# Patient Record
Sex: Male | Born: 1945 | Race: White | Hispanic: No | Marital: Married | Smoking: Never smoker
Health system: Southern US, Community
[De-identification: ages and names within clinical notes are randomized; demographics above are authoritative.]

## PROBLEM LIST (undated history)

## (undated) DIAGNOSIS — G4730 Sleep apnea, unspecified: Secondary | ICD-10-CM

## (undated) DIAGNOSIS — F32A Depression, unspecified: Secondary | ICD-10-CM

## (undated) DIAGNOSIS — E119 Type 2 diabetes mellitus without complications: Secondary | ICD-10-CM

## (undated) DIAGNOSIS — I255 Ischemic cardiomyopathy: Secondary | ICD-10-CM

## (undated) DIAGNOSIS — M25569 Pain in unspecified knee: Secondary | ICD-10-CM

## (undated) DIAGNOSIS — I4729 Other ventricular tachycardia: Secondary | ICD-10-CM

## (undated) DIAGNOSIS — R600 Localized edema: Secondary | ICD-10-CM

## (undated) DIAGNOSIS — F419 Anxiety disorder, unspecified: Secondary | ICD-10-CM

## (undated) DIAGNOSIS — L03119 Cellulitis of unspecified part of limb: Secondary | ICD-10-CM

## (undated) DIAGNOSIS — F329 Major depressive disorder, single episode, unspecified: Secondary | ICD-10-CM

## (undated) DIAGNOSIS — I2510 Atherosclerotic heart disease of native coronary artery without angina pectoris: Secondary | ICD-10-CM

## (undated) DIAGNOSIS — I2119 ST elevation (STEMI) myocardial infarction involving other coronary artery of inferior wall: Secondary | ICD-10-CM

## (undated) DIAGNOSIS — I10 Essential (primary) hypertension: Secondary | ICD-10-CM

## (undated) DIAGNOSIS — R609 Edema, unspecified: Secondary | ICD-10-CM

## (undated) DIAGNOSIS — I214 Non-ST elevation (NSTEMI) myocardial infarction: Secondary | ICD-10-CM

## (undated) DIAGNOSIS — J189 Pneumonia, unspecified organism: Secondary | ICD-10-CM

## (undated) DIAGNOSIS — I219 Acute myocardial infarction, unspecified: Secondary | ICD-10-CM

## (undated) DIAGNOSIS — I509 Heart failure, unspecified: Secondary | ICD-10-CM

## (undated) DIAGNOSIS — I472 Ventricular tachycardia: Secondary | ICD-10-CM

## (undated) DIAGNOSIS — G8929 Other chronic pain: Secondary | ICD-10-CM

## (undated) DIAGNOSIS — M1990 Unspecified osteoarthritis, unspecified site: Secondary | ICD-10-CM

## (undated) DIAGNOSIS — R5381 Other malaise: Secondary | ICD-10-CM

## (undated) DIAGNOSIS — I209 Angina pectoris, unspecified: Secondary | ICD-10-CM

## (undated) DIAGNOSIS — E785 Hyperlipidemia, unspecified: Secondary | ICD-10-CM

## (undated) DIAGNOSIS — D649 Anemia, unspecified: Secondary | ICD-10-CM

## (undated) HISTORY — PX: KNEE ARTHROSCOPY: SHX127

## (undated) HISTORY — DX: Localized edema: R60.0

## (undated) HISTORY — DX: Other ventricular tachycardia: I47.29

## (undated) HISTORY — DX: Edema, unspecified: R60.9

## (undated) HISTORY — DX: Anemia, unspecified: D64.9

## (undated) HISTORY — PX: CATARACT EXTRACTION W/ INTRAOCULAR LENS  IMPLANT, BILATERAL: SHX1307

## (undated) HISTORY — PX: CORONARY ANGIOPLASTY WITH STENT PLACEMENT: SHX49

## (undated) HISTORY — DX: Hyperlipidemia, unspecified: E78.5

## (undated) HISTORY — PX: CORONARY ANGIOPLASTY: SHX604

## (undated) HISTORY — DX: Other malaise: R53.81

## (undated) HISTORY — DX: Unspecified osteoarthritis, unspecified site: M19.90

## (undated) HISTORY — DX: Essential (primary) hypertension: I10

## (undated) HISTORY — DX: Cellulitis of unspecified part of limb: L03.119

## (undated) HISTORY — PX: SKIN GRAFT: SHX250

## (undated) HISTORY — PX: THROMBECTOMY: PRO61

## (undated) HISTORY — DX: Morbid (severe) obesity due to excess calories: E66.01

## (undated) HISTORY — DX: Other chronic pain: G89.29

## (undated) HISTORY — PX: TONSILLECTOMY AND ADENOIDECTOMY: SUR1326

## (undated) HISTORY — DX: Atherosclerotic heart disease of native coronary artery without angina pectoris: I25.10

## (undated) HISTORY — DX: Pain in unspecified knee: M25.569

## (undated) HISTORY — DX: Ventricular tachycardia: I47.2

---

## 1997-09-13 ENCOUNTER — Inpatient Hospital Stay (HOSPITAL_COMMUNITY): Admission: EM | Admit: 1997-09-13 | Discharge: 1997-09-15 | Payer: Self-pay | Admitting: Emergency Medicine

## 2001-12-27 ENCOUNTER — Emergency Department (HOSPITAL_COMMUNITY): Admission: EM | Admit: 2001-12-27 | Discharge: 2001-12-27 | Payer: Self-pay | Admitting: *Deleted

## 2001-12-27 ENCOUNTER — Encounter: Payer: Self-pay | Admitting: *Deleted

## 2002-03-03 ENCOUNTER — Encounter: Admission: RE | Admit: 2002-03-03 | Discharge: 2002-03-03 | Payer: Self-pay | Admitting: Internal Medicine

## 2002-03-03 ENCOUNTER — Encounter: Payer: Self-pay | Admitting: Internal Medicine

## 2002-10-11 ENCOUNTER — Encounter: Payer: Self-pay | Admitting: Cardiology

## 2002-10-11 ENCOUNTER — Inpatient Hospital Stay (HOSPITAL_COMMUNITY): Admission: EM | Admit: 2002-10-11 | Discharge: 2002-10-14 | Payer: Self-pay | Admitting: Emergency Medicine

## 2004-02-06 ENCOUNTER — Ambulatory Visit: Payer: Self-pay | Admitting: Internal Medicine

## 2004-02-20 ENCOUNTER — Ambulatory Visit: Payer: Self-pay | Admitting: Internal Medicine

## 2004-03-26 ENCOUNTER — Ambulatory Visit: Payer: Self-pay | Admitting: Internal Medicine

## 2004-08-06 ENCOUNTER — Ambulatory Visit: Payer: Self-pay | Admitting: Internal Medicine

## 2004-08-27 ENCOUNTER — Other Ambulatory Visit: Payer: Self-pay

## 2004-08-27 ENCOUNTER — Ambulatory Visit: Payer: Self-pay | Admitting: Internal Medicine

## 2004-09-03 ENCOUNTER — Ambulatory Visit: Payer: Self-pay | Admitting: Ophthalmology

## 2004-11-11 ENCOUNTER — Ambulatory Visit: Payer: Self-pay | Admitting: Family Medicine

## 2004-11-26 ENCOUNTER — Ambulatory Visit: Payer: Self-pay | Admitting: Ophthalmology

## 2005-01-23 ENCOUNTER — Ambulatory Visit: Payer: Self-pay | Admitting: Internal Medicine

## 2005-04-03 ENCOUNTER — Encounter: Payer: Self-pay | Admitting: Internal Medicine

## 2006-08-09 ENCOUNTER — Ambulatory Visit: Payer: Self-pay | Admitting: Internal Medicine

## 2006-08-27 ENCOUNTER — Ambulatory Visit: Payer: Self-pay | Admitting: Internal Medicine

## 2006-11-30 ENCOUNTER — Ambulatory Visit: Payer: Self-pay | Admitting: Internal Medicine

## 2008-09-01 ENCOUNTER — Inpatient Hospital Stay: Payer: Self-pay | Admitting: Internal Medicine

## 2008-10-13 ENCOUNTER — Emergency Department: Payer: Self-pay | Admitting: Internal Medicine

## 2009-05-27 ENCOUNTER — Ambulatory Visit: Payer: Self-pay | Admitting: Internal Medicine

## 2010-04-01 ENCOUNTER — Emergency Department (HOSPITAL_COMMUNITY): Payer: Medicare Other

## 2010-04-01 ENCOUNTER — Inpatient Hospital Stay (HOSPITAL_COMMUNITY)
Admission: EM | Admit: 2010-04-01 | Discharge: 2010-04-09 | DRG: 872 | Disposition: A | Discharge status: Home Health | Payer: Medicare Other | Attending: Family Medicine | Admitting: Family Medicine

## 2010-04-01 DIAGNOSIS — D649 Anemia, unspecified: Secondary | ICD-10-CM | POA: Diagnosis present

## 2010-04-01 DIAGNOSIS — E119 Type 2 diabetes mellitus without complications: Secondary | ICD-10-CM | POA: Diagnosis present

## 2010-04-01 DIAGNOSIS — L03119 Cellulitis of unspecified part of limb: Secondary | ICD-10-CM | POA: Diagnosis present

## 2010-04-01 DIAGNOSIS — Z794 Long term (current) use of insulin: Secondary | ICD-10-CM

## 2010-04-01 DIAGNOSIS — A419 Sepsis, unspecified organism: Principal | ICD-10-CM | POA: Diagnosis present

## 2010-04-01 DIAGNOSIS — L02419 Cutaneous abscess of limb, unspecified: Secondary | ICD-10-CM | POA: Diagnosis present

## 2010-04-01 DIAGNOSIS — E785 Hyperlipidemia, unspecified: Secondary | ICD-10-CM | POA: Diagnosis present

## 2010-04-01 DIAGNOSIS — I10 Essential (primary) hypertension: Secondary | ICD-10-CM | POA: Diagnosis present

## 2010-04-01 LAB — DIFFERENTIAL
Band Neutrophils: 0 % (ref 0–10)
Basophils Absolute: 0 10*3/uL (ref 0.0–0.1)
Basophils Relative: 0 % (ref 0–1)
Blasts: 0 %
Eosinophils Absolute: 0 10*3/uL (ref 0.0–0.7)
Eosinophils Relative: 0 % (ref 0–5)
Lymphocytes Relative: 1 % — ABNORMAL LOW (ref 12–46)
Lymphs Abs: 0.2 10*3/uL — ABNORMAL LOW (ref 0.7–4.0)
Metamyelocytes Relative: 0 %
Monocytes Absolute: 1.2 K/uL — ABNORMAL HIGH (ref 0.1–1.0)
Monocytes Relative: 6 % (ref 3–12)
Myelocytes: 0 %
Neutro Abs: 19.2 10*3/uL — ABNORMAL HIGH (ref 1.7–7.7)
Neutrophils Relative %: 93 % — ABNORMAL HIGH (ref 43–77)
Promyelocytes Absolute: 0 %
nRBC: 0 /100 WBC

## 2010-04-01 LAB — TROPONIN I: Troponin I: 0.11 ng/mL — ABNORMAL HIGH (ref 0.00–0.06)

## 2010-04-01 LAB — URINALYSIS, ROUTINE W REFLEX MICROSCOPIC
Bilirubin Urine: NEGATIVE
Glucose, UA: 100 mg/dL — AB
Ketones, ur: NEGATIVE mg/dL
Leukocytes, UA: NEGATIVE
Nitrite: NEGATIVE
Protein, ur: NEGATIVE mg/dL
Specific Gravity, Urine: 1.021 (ref 1.005–1.030)
Urobilinogen, UA: 0.2 mg/dL (ref 0.0–1.0)
pH: 5 (ref 5.0–8.0)

## 2010-04-01 LAB — COMPREHENSIVE METABOLIC PANEL
ALT: 17 U/L (ref 0–53)
AST: 28 U/L (ref 0–37)
Alkaline Phosphatase: 70 U/L (ref 39–117)
CO2: 26 mEq/L (ref 19–32)
Chloride: 103 mEq/L (ref 96–112)
GFR calc Af Amer: >60 mL/min (ref >60)
GFR calc non Af Amer: >60 mL/min (ref >60)
Glucose, Bld: 209 mg/dL — ABNORMAL HIGH (ref 70–99)
Sodium: 137 mEq/L (ref 135–145)
Total Bilirubin: 0.4 mg/dL (ref 0.3–1.2)

## 2010-04-01 LAB — CBC
HCT: 34.5 % — ABNORMAL LOW (ref 39.0–52.0)
Hemoglobin: 11 g/dL — ABNORMAL LOW (ref 13.0–17.0)
MCH: 25.4 pg — ABNORMAL LOW (ref 26.0–34.0)
MCHC: 31.9 g/dL (ref 30.0–36.0)
MCV: 79.7 fL (ref 78.0–100.0)
Platelets: 309 10*3/uL (ref 150–400)
RBC: 4.33 MIL/uL (ref 4.22–5.81)
RDW: 16 % — ABNORMAL HIGH (ref 11.5–15.5)
WBC: 20.6 10*3/uL — ABNORMAL HIGH (ref 4.0–10.5)

## 2010-04-01 LAB — POCT CARDIAC MARKERS
CKMB, poc: 4.7 ng/mL (ref 1.0–8.0)
Myoglobin, poc: >500 ng/mL (ref 12–200)
Troponin i, poc: <0.05 ng/mL (ref 0.00–0.09)

## 2010-04-01 LAB — URINE MICROSCOPIC-ADD ON

## 2010-04-01 LAB — COMPREHENSIVE METABOLIC PANEL WITH GFR
Albumin: 2.8 g/dL — ABNORMAL LOW (ref 3.5–5.2)
BUN: 21 mg/dL (ref 6–23)
Calcium: 8.9 mg/dL (ref 8.4–10.5)
Creatinine, Ser: 1.21 mg/dL (ref 0.4–1.5)
Potassium: 4.2 meq/L (ref 3.5–5.1)
Total Protein: 6.9 g/dL (ref 6.0–8.3)

## 2010-04-01 LAB — PROCALCITONIN: Procalcitonin: 1.74 ng/mL

## 2010-04-01 LAB — CK TOTAL AND CKMB (NOT AT ARMC)
CK, MB: 8.1 ng/mL (ref 0.3–4.0)
Relative Index: 0.4 (ref 0.0–2.5)
Total CK: 2117 U/L — ABNORMAL HIGH (ref 7–232)

## 2010-04-01 LAB — LACTIC ACID, PLASMA: Lactic Acid, Venous: 2.7 mmol/L — ABNORMAL HIGH (ref 0.5–2.2)

## 2010-04-01 LAB — MRSA PCR SCREENING: MRSA by PCR: NEGATIVE

## 2010-04-01 LAB — PROLACTIN: Prolactin: 5 ng/mL (ref 2.1–17.1)

## 2010-04-02 DIAGNOSIS — I517 Cardiomegaly: Secondary | ICD-10-CM

## 2010-04-02 LAB — MAGNESIUM: Magnesium: 2.2 mg/dL (ref 1.5–2.5)

## 2010-04-02 LAB — URINE CULTURE
Colony Count: NO GROWTH
Culture  Setup Time: 201203061934
Culture: NO GROWTH

## 2010-04-02 LAB — CK TOTAL AND CKMB (NOT AT ARMC)
CK, MB: 6 ng/mL — ABNORMAL HIGH (ref 0.3–4.0)
CK, MB: 6 ng/mL — ABNORMAL HIGH (ref 0.3–4.0)
Relative Index: 0.2 (ref 0.0–2.5)
Relative Index: 0.2 (ref 0.0–2.5)
Total CK: 2402 U/L — ABNORMAL HIGH (ref 7–232)
Total CK: 2759 U/L — ABNORMAL HIGH (ref 7–232)

## 2010-04-02 LAB — GLUCOSE, CAPILLARY
Glucose-Capillary: 149 mg/dL — ABNORMAL HIGH (ref 70–99)
Glucose-Capillary: 103 mg/dL — ABNORMAL HIGH (ref 70–99)
Glucose-Capillary: 141 mg/dL — ABNORMAL HIGH (ref 70–99)

## 2010-04-02 LAB — CBC
HCT: 31.6 % — ABNORMAL LOW (ref 39.0–52.0)
Hemoglobin: 9.7 g/dL — ABNORMAL LOW (ref 13.0–17.0)
MCH: 24.6 pg — ABNORMAL LOW (ref 26.0–34.0)
MCHC: 30.7 g/dL (ref 30.0–36.0)
MCV: 80.2 fL (ref 78.0–100.0)
Platelets: 247 10*3/uL (ref 150–400)
RBC: 3.94 MIL/uL — ABNORMAL LOW (ref 4.22–5.81)
RDW: 16.4 % — ABNORMAL HIGH (ref 11.5–15.5)
WBC: 10.7 10*3/uL — ABNORMAL HIGH (ref 4.0–10.5)

## 2010-04-02 LAB — BASIC METABOLIC PANEL
BUN: 14 mg/dL (ref 6–23)
CO2: 26 mEq/L (ref 19–32)
CO2: 27 mEq/L (ref 19–32)
Calcium: 8.3 mg/dL — ABNORMAL LOW (ref 8.4–10.5)
Chloride: 100 mEq/L (ref 96–112)
Creatinine, Ser: 1.03 mg/dL (ref 0.4–1.5)
Creatinine, Ser: 1.04 mg/dL (ref 0.4–1.5)
GFR calc Af Amer: >60 mL/min (ref >60)
Glucose, Bld: 106 mg/dL — ABNORMAL HIGH (ref 70–99)
Sodium: 136 mEq/L (ref 135–145)

## 2010-04-02 LAB — BASIC METABOLIC PANEL WITH GFR
BUN: 14 mg/dL (ref 6–23)
Calcium: 7.9 mg/dL — ABNORMAL LOW (ref 8.4–10.5)
GFR calc non Af Amer: >60 mL/min (ref >60)
Glucose, Bld: 94 mg/dL (ref 70–99)
Potassium: 3.7 meq/L (ref 3.5–5.1)

## 2010-04-02 LAB — IRON AND TIBC: Saturation Ratios: 6 % — ABNORMAL LOW (ref 20–55)

## 2010-04-02 LAB — TROPONIN I
Troponin I: 0.12 ng/mL — ABNORMAL HIGH (ref 0.00–0.06)
Troponin I: 0.15 ng/mL — ABNORMAL HIGH (ref 0.00–0.06)

## 2010-04-02 LAB — FOLATE: Folate: 5.6 ng/mL

## 2010-04-02 LAB — FERRITIN: Ferritin: 232 ng/mL (ref 22–322)

## 2010-04-02 LAB — VITAMIN B12: Vitamin B-12: 390 pg/mL (ref 211–911)

## 2010-04-03 LAB — RENAL FUNCTION PANEL
BUN: 15 mg/dL (ref 6–23)
CO2: 27 mEq/L (ref 19–32)
Chloride: 107 mEq/L (ref 96–112)
Creatinine, Ser: 0.97 mg/dL (ref 0.4–1.5)
Glucose, Bld: 98 mg/dL (ref 70–99)
Potassium: 4.6 mEq/L (ref 3.5–5.1)

## 2010-04-03 LAB — CBC
Hemoglobin: 10.4 g/dL — ABNORMAL LOW (ref 13.0–17.0)
MCH: 24.9 pg — ABNORMAL LOW (ref 26.0–34.0)
MCHC: 30.8 g/dL (ref 30.0–36.0)
Platelets: 251 10*3/uL (ref 150–400)
RDW: 16.5 % — ABNORMAL HIGH (ref 11.5–15.5)

## 2010-04-03 LAB — DIFFERENTIAL
Basophils Absolute: 0 10*3/uL (ref 0.0–0.1)
Basophils Relative: 0 % (ref 0–1)
Eosinophils Absolute: 0.4 10*3/uL (ref 0.0–0.7)
Monocytes Absolute: 1.2 10*3/uL — ABNORMAL HIGH (ref 0.1–1.0)
Neutro Abs: 9.3 10*3/uL — ABNORMAL HIGH (ref 1.7–7.7)

## 2010-04-03 LAB — GLUCOSE, CAPILLARY: Glucose-Capillary: 196 mg/dL — ABNORMAL HIGH (ref 70–99)

## 2010-04-03 LAB — PREALBUMIN: Prealbumin: 9 mg/dL — ABNORMAL LOW (ref 17.0–34.0)

## 2010-04-03 LAB — VITAMIN D 25 HYDROXY (VIT D DEFICIENCY, FRACTURES): Vit D, 25-Hydroxy: 22 ng/mL — ABNORMAL LOW (ref 30–89)

## 2010-04-03 NOTE — H&P (Signed)
NAME:  [name], [name] NO.:  [id number]  MEDICAL RECORD NO.:  [id number]           PATIENT TYPE:  E  LOCATION:  [facility]                         FACILITY:  [facility]  PHYSICIAN:  Vania Rea, M.D. DATE OF BIRTH:  [birth year]  DATE OF ADMISSION:  04/01/2010 DATE OF DISCHARGE:                             HISTORY & PHYSICAL   PRIMARY CARE PHYSICIAN:  Dr. Marcello Fennel in [place].  CHIEF COMPLAINT:  Fever, weakness, and knee pain since today.  HISTORY OF PRESENT ILLNESS:  This is a morbidly obese Caucasian gentleman with multiple medical problems including diabetes, hypertension, morbid obesity, giving his weight as 500 pounds although in the emergency room measured his weight as being at least 700 pounds and who has severe osteoarthritis of the knees.  The patient has been feeling ill at least since this morning but his history does vary. Sometimes he says he has been feeling sick for 6 months but reports that he came to the emergency room today because he was so weak that he fell. The patient denies any cough, shortness of breath or chest pain.  He denies any frequency or dysuria.  He does have chronic knee pains.  He does have impaired mobility because of his chronic knee pain, his morbid obesity, and typically sleeps in a recliner.  In any event, the patient came to the emergency room today with a vague generalized complaint, was found to have a temperature of 102 by Foley, to have leukocytosis, and was found to have swelling and inflammation of his lower extremities and the hospitalist service was called to assist in management.  The patient says he takes his medications regularly, does not have any specific pain medications, although Flexeril does help his back but he has to take it only at night because it makes him drowsy.  Also, reports that he checks his blood sugars twice daily and they are typically around 180, very rarely over 200.  Also, reports that he does  not take Actos although it is prescribed because he has heard bad things about it causing swelling of the legs and he already has that problem.  PAST MEDICAL HISTORY: 1. Diabetes. 2. Hypertension. 3. Arthritis of both knees. 4. Morbid obesity. 5. Dyslipidemia.  MEDICATIONS: 1. Amlodipine 10 mg daily. 2. Atenolol 100 mg daily. 3. Gemfibrozil 600 mg twice daily. 4. Pravastatin 40 mg daily. 5. Glyburide/metformin 2.5/500 two tablets twice daily. 6. Humulin N 45 units twice daily. 7. Hydralazine 25 units twice daily. 8. Lisinopril 40 mg daily. 9. Furosemide 40 mg twice daily.  ALLERGIES:  No known drug allergies.  SOCIAL HISTORY:  Denies tobacco, alcohol or illicit drug use.  He is on social security disability.  Lives with his wife in [place].  He formally worked as a Ecologist.  FAMILY HISTORY:  Significant for a brother who died of an acute heart attack at age 77, a father who had coronary artery disease and also died in his 65s.  Both parents had severe osteoarthritis.  REVIEW OF SYSTEMS:  Other than noted above, significant only for severe discomfort in his  current bariatric bed.  PHYSICAL EXAMINATION:  GENERAL:  A pleasant middle-aged Caucasian gentleman reclining in a bariatric bed complaining of severe discomfort because the bed is not big enough for him.  He gives his weight as 500 pounds.  His weight was initially registered at 870 pounds when he was initially put on the bed.  Now with some readjustments, a repeat of his weight shows 713 pounds. VITAL SIGNS:  His temperature is 102.3 via Foley, respiratory rate 24, pulse 103, blood pressure 106/40.  He is saturating at 94% on room air. HEENT:  Pupils are round and equal.  Mucous membranes pink, dry, anicteric. NECK:  No cervical lymphadenopathy, has a very thick neck.  No thyromegaly or jugular venous distention appreciated. CHEST:  Clear to auscultation bilaterally. CARDIOVASCULAR:  Regular  rhythm.  No murmur appreciated. ABDOMEN:  Massively obese.  He has a large pannus.  He has extensive Candida in the folds of his pannus. EXTREMITIES:  Of course very thick.  He does have some lymphedema, evidence of venous stasis, and marked hypertrophic changes of the skin associated with venous stasis and multiple folds within his legs.  These folds contain areas of exudate, possibly Candida, possibly some other type of infection.  There are other areas around his knees which are hyperemic and tender suggesting cellulitis.  There are other areas of his lower legs which are hyperemic, but thickened and because of his neuropathy is unable to appreciate tenderness.  The area is numb.  It is unclear whether this is due to cellulitis or just venous stasis, unable to appreciate dorsalis pedis pulses because of the skin changes but the toes are warm and capillary refill is good. SKIN:  As noted earlier in the description of his extremities, the skin of his lower extremities contains patchy areas of erythema suggesting cellulitis.  He has multiple ichthyotic areas at the dorsum of his feet and the lower legs.  He has a Candida rash in the folds of his pannus. CENTRAL NERVOUS SYSTEM:  Cranial nerves II-XII are grossly intact.  He has full power in the lower extremities.  He is able to move bilateral lower extremities equally but complains of pain with restriction of movement and complains of tenderness of both knees but numbness of both feet and impaired sensation below the knees.  No lateralizing signs.  LABORATORY DATA:  His white count is elevated at 20.6, hemoglobin 11.0, MCV 79.7, platelets 209.  Absolute neutrophil count is 19.2, polychromasia is present on the smear.  Sodium is 137, potassium 4.2, chloride 103, CO2 of 26, glucose 209, BUN 21, creatinine 1.2.  His albumin is 2.8 with a calcium of 8.9.  His liver functions are otherwise unremarkable.  His lactic acid is elevated at 2.7.   Procalcitonin is pending.  Cardiac markers showed a myoglobin greater than 500, CK-MB 4.7, troponins undetectable.  Urinalysis showed clear urine with a specific gravity of 1.021, 100 glucose, negative for ketones negative for protein, nitrite and leukocyte esterase negative.  Microscopy shows 7-10 red cells but is otherwise unremarkable.  Portable chest x-ray shows low lung volumes but no acute findings.  ASSESSMENT: 1. Occult sepsis as evidenced by tachycardia borderline blood     pressures, fevers, generalized weakness, and elevated lactic acid. 2. Bilateral lower extremity cellulitis. 3. Diabetes type 2, uncontrolled. 4. Candidal infection of the skin. 5. Hypertension.  Now borderline hypotensive. 6. Osteoarthritis and impaired mobility. 7. Morbid obesity, aggravating osteoarthritis and impaired mobility. 8. Hyperlipidemia.  PLAN: 1. We  will admit this gentleman to the step-down unit.  We will start     with vigorous IV fluid hydration, broad-spectrum antibiotic     coverage pending the results of his blood cultures, we will add an     antifungal Diflucan to his regimen but for the time being we will     only use Nystatin on his skin.  We will consult physical therapist,     occupational therapist, and social worker for assistance with     management.  The nurses had some conversation with his wife and are     unsure from her conversations as to his wife's ability to manage     this patient 2. We will get a nutrition consult to assist with plans for weight     loss. 3. Other plans as per orders.  Critical care time attending to this patient, 60 minutes.     Vania Rea, M.D.     LC/MEDQ  D:  04/01/2010  T:  04/01/2010  Job:  [id number]  cc:   Dr. Marcello Fennel in [place]  Electronically Signed by Vania Rea M.D. on 04/03/2010 02:43:12 AM

## 2010-04-04 LAB — CBC
HCT: 30.7 % — ABNORMAL LOW (ref 39.0–52.0)
Hemoglobin: 9.6 g/dL — ABNORMAL LOW (ref 13.0–17.0)
MCH: 24.8 pg — ABNORMAL LOW (ref 26.0–34.0)
MCHC: 31.3 g/dL (ref 30.0–36.0)
MCV: 79.3 fL (ref 78.0–100.0)
Platelets: 259 10*3/uL (ref 150–400)
RBC: 3.87 MIL/uL — ABNORMAL LOW (ref 4.22–5.81)
RDW: 16.4 % — ABNORMAL HIGH (ref 11.5–15.5)
WBC: 8.6 10*3/uL (ref 4.0–10.5)

## 2010-04-04 LAB — DIFFERENTIAL
Basophils Absolute: 0 10*3/uL (ref 0.0–0.1)
Basophils Relative: 0 % (ref 0–1)
Eosinophils Absolute: 0.3 10*3/uL (ref 0.0–0.7)
Eosinophils Relative: 4 % (ref 0–5)
Lymphocytes Relative: 12 % (ref 12–46)
Lymphs Abs: 1 10*3/uL (ref 0.7–4.0)
Monocytes Absolute: 1.1 10*3/uL — ABNORMAL HIGH (ref 0.1–1.0)
Monocytes Relative: 13 % — ABNORMAL HIGH (ref 3–12)
Neutro Abs: 6.1 10*3/uL (ref 1.7–7.7)
Neutrophils Relative %: 72 % (ref 43–77)

## 2010-04-04 LAB — BASIC METABOLIC PANEL WITH GFR
BUN: 14 mg/dL (ref 6–23)
CO2: 23 meq/L (ref 19–32)
Calcium: 8.2 mg/dL — ABNORMAL LOW (ref 8.4–10.5)
Chloride: 111 meq/L (ref 96–112)
Creatinine, Ser: 1.04 mg/dL (ref 0.4–1.5)
GFR calc non Af Amer: >60 mL/min
Glucose, Bld: 114 mg/dL — ABNORMAL HIGH (ref 70–99)
Potassium: 3.7 meq/L (ref 3.5–5.1)
Sodium: 140 meq/L (ref 135–145)

## 2010-04-04 LAB — GLUCOSE, CAPILLARY
Glucose-Capillary: 102 mg/dL — ABNORMAL HIGH (ref 70–99)
Glucose-Capillary: 134 mg/dL — ABNORMAL HIGH (ref 70–99)
Glucose-Capillary: 121 mg/dL — ABNORMAL HIGH (ref 70–99)

## 2010-04-05 LAB — GLUCOSE, CAPILLARY
Glucose-Capillary: 200 mg/dL — ABNORMAL HIGH (ref 70–99)
Glucose-Capillary: 77 mg/dL (ref 70–99)
Glucose-Capillary: 115 mg/dL — ABNORMAL HIGH (ref 70–99)

## 2010-04-06 LAB — GLUCOSE, CAPILLARY
Glucose-Capillary: 107 mg/dL — ABNORMAL HIGH (ref 70–99)
Glucose-Capillary: 127 mg/dL — ABNORMAL HIGH (ref 70–99)
Glucose-Capillary: 154 mg/dL — ABNORMAL HIGH (ref 70–99)

## 2010-04-07 LAB — GLUCOSE, CAPILLARY
Glucose-Capillary: 125 mg/dL — ABNORMAL HIGH (ref 70–99)
Glucose-Capillary: 139 mg/dL — ABNORMAL HIGH (ref 70–99)

## 2010-04-07 LAB — CULTURE, BLOOD (ROUTINE X 2)
Culture  Setup Time: 201203061647
Culture: NO GROWTH

## 2010-04-08 LAB — GLUCOSE, CAPILLARY
Glucose-Capillary: 104 mg/dL — ABNORMAL HIGH (ref 70–99)
Glucose-Capillary: 136 mg/dL — ABNORMAL HIGH (ref 70–99)

## 2010-04-08 LAB — CULTURE, BLOOD (ROUTINE X 2)
Culture  Setup Time: 201203070023
Culture: NO GROWTH

## 2010-04-08 NOTE — Progress Notes (Signed)
NAME:  [name], [name] NO.:  [id number]  MEDICAL RECORD NO.:  [id number]           PATIENT TYPE:  I  LOCATION:  5152                         FACILITY:  [facility]  PHYSICIAN:  Brendia Sacks, MD    DATE OF BIRTH:  1945/10/23                                PROGRESS NOTE   PRIMARY CARE PHYSICIAN: Dr. Gilford Rile in [place].  INTERIM DIAGNOSES: 1. Resolved sepsis, presumably secondary to cellulitis. 2. Resolved bilateral lower extremity cellulitis. 3. Morbid obesity with massive bilateral lower extremity lymphedema. 4. Diabetes mellitus type 2, uncontrolled by hemoglobin A1c.  Stable     as an inpatient. 5. Anemia of acute illness, stable. 6. Hypertension, stable.  BRIEF NARRATIVE OF THE PATIENT'S HOSPITAL COURSE: A 65 year old man presented to the emergency room with fever and weakness.  He was admitted and the patient was started on broad-spectrum antibiotic therapy for sepsis and cellulitis.  His condition quickly improved, sepsis resolved and he was transferred simply to oral antibiotics.  His cellulitis appears to have resolved.  At this point, he does have chronic massive lower extremity lymphedema with dependent rubor and chronic skin changes.  He does have some symmetric erythema of his lower extremities, but this does not appear to be infectious anymore.  His diabetes has remained stable during this hospitalization. His anemia is also stable.  The main issue at this point is his debility.  The patient would like to go to a Bariatric Weight Loss Center.  At this point, he continues with physical and occupational therapy.  He has been reassessed by PT today and is felt that he is still quite limited of his abilities.  The patient would prefer not to go to a skilled rehab center.  At this point, social work is working on obtaining bariatric place for weight loss.  Did discuss with him that pending this urge he may need to consider a skilled facility.  At  this point, he needs to continue with physical therapy.  SUBJECTIVE: The patient feels well today overall.  OBJECTIVE: VITAL SIGNS:  Temperature is 97.7, pulse 62, respirations 20, blood pressure 114/71, sat 93% on room air. CARDIOVASCULAR:  Regular rate and rhythm.  No murmurs, rub, or gallop. RESPIRATORY:  Clear to auscultation bilaterally.  No wheezes, rales, or rhonchi.  Normal respiratory effort. EXTREMITIES:  His lower extremity lymphedema is unchanged and dependent rubor is unchanged.  LABORATORY DATA: No imaging or laboratory data.  At this point, his calories and blood sugars are well-controlled.  ASSESSMENT/PLAN: 1. Sepsis, resolved, presumably secondary to cellulitis. 2. Bilateral lower extremity cellulitis, resolved.  Completed     antibiotics. 3. Diabetes mellitus type 2, uncontrolled by hemoglobin A1c.  This     remains stable, is well controlled on NPH sliding scale insulin. 4. Anemia of acute illness.  This has been stable. 5. Hypertension.  This has remained stable.  Continue with atenolol,     lisinopril, and hydralazine. 6. Morbid obesity. 7. Disposition.  As noted above, the patient for Bariatric Weight Loss     Center at this point.  Social Work is working on  placement.  He may     need to consider a skilled rehab center.     Brendia Sacks, MD     DG/MEDQ  D:  04/08/2010  T:  04/08/2010  Job:  [id number]  cc:   Dr. Gilford Rile  Electronically Signed by Brendia Sacks  on 04/08/2010 08:57:38 PM

## 2010-04-09 LAB — GLUCOSE, CAPILLARY
Glucose-Capillary: 109 mg/dL — ABNORMAL HIGH (ref 70–99)
Glucose-Capillary: 15 mg/dL — CL (ref 70–99)
Glucose-Capillary: 124 mg/dL — ABNORMAL HIGH (ref 70–99)

## 2010-04-10 NOTE — Discharge Summary (Signed)
  NAME:  [name], [name] NO.:  [id number]  MEDICAL RECORD NO.:  [id number]           PATIENT TYPE:  I  LOCATION:  5152                         FACILITY:  [facility]  PHYSICIAN:  Zannie Cove, MD     DATE OF BIRTH:  1945/02/10  DATE OF ADMISSION:  04/01/2010 DATE OF DISCHARGE:  04/09/2010                              DISCHARGE SUMMARY   PRIMARY CARE PHYSICIAN:  Dr. Marcello Fennel in [place], [place].  DISCHARGE DIAGNOSES: 1. Sepsis, resolves. 2. Bilateral lower extremity cellulitis, improved. 3. Severe lymphedema of bilateral lower extremity. 4. Morbid obesity. 5. Type 2 diabetes. 6. Anemia of acute illness. 7. Hypertension. 8. Dyslipidemia.  Discharge medications are as follows: 1. Nystatin 100,000 units/g powder one application topically daily. 2. MiraLax 17 g p.o. daily p.r.n. 3. Atenolol 100 mg daily. 4. Beclomethasone topical one application topically q.i.d. p.r.n. for     eczema. 5. Flexeril 10 mg p.o. daily p.r.n. for back spasms. 6. Lasix 40 mg two tablets daily. 7. Gemfibrozil 600 mg p.o. b.i.d. 8. Glyburide/metformin 2.5/500 two tablets p.o. b.i.d. 9. Humulin N 45 units subcu b.i.d. 10.Hydralazine 25 mg p.o. b.i.d. 11.Lisinopril 40 mg daily. 12.Norvasc 10 mg daily. 13.Pravachol 40 mg daily. 14.Tramadol 50 mg one tablet p.o. b.i.d.  HOSPITAL COURSE:  [name] is a 65 year old gentleman with morbid obesity and severe lymphedema of bilateral lower extremities, presented to the hospital with sepsis.  His sepsis was felt to be secondary to cellulitis superimposed on chronic massive lower extremity lymphedema with dependent rubor and chronic skin changes.  He was treated initially empirically with IV vancomycin, subsequently transitioned to p.o. doxycycline.  He has completed a 7-day course of antibiotics, which was subsequently discontinued.  His fever, leukocytosis, as well as his cellulitis have improved.  In addition, he was also treated  with antifungals and topical as well as systemic, which have now subsequently been discontinued.  Rest of his chronic medical problems remained stable.  He did discuss with social worker regarding Bariatric Weight Loss Centers and fortunately this has to be something that has done through his primary care physician and hence, we will defer this to Dr. Marcello Fennel.  DISCHARGE CONDITION:  Stable.  VITAL SIGNS ON DISCHARGE:  Temperature is 97.5, pulse 52, blood pressure 116/58, respirations 20, satting 94% on room air.  DISCHARGE FOLLOWUP: 1. Dr. Marcello Fennel in 1 week. 2. [facility] program by referral to his primary     care physician.     Zannie Cove, MD     PJ/MEDQ  D:  04/09/2010  T:  04/10/2010  Job:  [id number]  cc:   Dr. Marcello Fennel  Electronically Signed by Zannie Cove  on 04/10/2010 05:22:18 PM

## 2010-05-26 ENCOUNTER — Encounter: Payer: Medicare Other | Admitting: Internal Medicine

## 2010-05-27 ENCOUNTER — Encounter: Payer: Medicare Other | Admitting: Internal Medicine

## 2010-07-13 ENCOUNTER — Emergency Department (HOSPITAL_COMMUNITY): Payer: Medicare Other

## 2010-07-13 ENCOUNTER — Inpatient Hospital Stay (HOSPITAL_COMMUNITY)
Admission: EM | Admit: 2010-07-13 | Discharge: 2010-07-17 | DRG: 247 | Disposition: A | Discharge status: Home / Self Care | Payer: Medicare Other | Attending: Cardiovascular Disease | Admitting: Cardiovascular Disease

## 2010-07-13 DIAGNOSIS — I219 Acute myocardial infarction, unspecified: Secondary | ICD-10-CM

## 2010-07-13 DIAGNOSIS — I2510 Atherosclerotic heart disease of native coronary artery without angina pectoris: Secondary | ICD-10-CM

## 2010-07-13 DIAGNOSIS — I890 Lymphedema, not elsewhere classified: Secondary | ICD-10-CM | POA: Diagnosis present

## 2010-07-13 DIAGNOSIS — E785 Hyperlipidemia, unspecified: Secondary | ICD-10-CM | POA: Diagnosis present

## 2010-07-13 DIAGNOSIS — I10 Essential (primary) hypertension: Secondary | ICD-10-CM | POA: Diagnosis present

## 2010-07-13 DIAGNOSIS — I2109 ST elevation (STEMI) myocardial infarction involving other coronary artery of anterior wall: Principal | ICD-10-CM | POA: Diagnosis present

## 2010-07-13 DIAGNOSIS — Z79899 Other long term (current) drug therapy: Secondary | ICD-10-CM

## 2010-07-13 DIAGNOSIS — E119 Type 2 diabetes mellitus without complications: Secondary | ICD-10-CM | POA: Diagnosis present

## 2010-07-13 DIAGNOSIS — I4729 Other ventricular tachycardia: Secondary | ICD-10-CM | POA: Diagnosis not present

## 2010-07-13 DIAGNOSIS — R0902 Hypoxemia: Secondary | ICD-10-CM | POA: Diagnosis not present

## 2010-07-13 DIAGNOSIS — M1710 Unilateral primary osteoarthritis, unspecified knee: Secondary | ICD-10-CM | POA: Diagnosis present

## 2010-07-13 DIAGNOSIS — I472 Ventricular tachycardia, unspecified: Secondary | ICD-10-CM | POA: Diagnosis not present

## 2010-07-13 DIAGNOSIS — E876 Hypokalemia: Secondary | ICD-10-CM | POA: Diagnosis not present

## 2010-07-13 DIAGNOSIS — Z794 Long term (current) use of insulin: Secondary | ICD-10-CM

## 2010-07-13 DIAGNOSIS — Z6841 Body Mass Index (BMI) 40.0 and over, adult: Secondary | ICD-10-CM

## 2010-07-13 DIAGNOSIS — R5381 Other malaise: Secondary | ICD-10-CM | POA: Diagnosis not present

## 2010-07-13 LAB — DIFFERENTIAL
Basophils Absolute: 0 10*3/uL (ref 0.0–0.1)
Eosinophils Relative: 3 % (ref 0–5)
Lymphocytes Relative: 16 % (ref 12–46)
Lymphs Abs: 2.3 10*3/uL (ref 0.7–4.0)
Monocytes Absolute: 1 10*3/uL (ref 0.1–1.0)

## 2010-07-13 LAB — CBC
HCT: 41.2 % (ref 39.0–52.0)
MCHC: 32.3 g/dL (ref 30.0–36.0)
MCV: 75.5 fL — ABNORMAL LOW (ref 78.0–100.0)
RDW: 15.1 % (ref 11.5–15.5)

## 2010-07-13 LAB — BASIC METABOLIC PANEL
Chloride: 94 mEq/L — ABNORMAL LOW (ref 96–112)
GFR calc Af Amer: >60 mL/min (ref >60)
Potassium: 4.1 mEq/L (ref 3.5–5.1)

## 2010-07-13 LAB — TROPONIN I: Troponin I: <0.3 ng/mL (ref <0.30)

## 2010-07-13 LAB — CK TOTAL AND CKMB (NOT AT ARMC): Relative Index: INVALID (ref 0.0–2.5)

## 2010-07-14 ENCOUNTER — Inpatient Hospital Stay (HOSPITAL_COMMUNITY): Payer: Medicare Other

## 2010-07-14 DIAGNOSIS — I517 Cardiomegaly: Secondary | ICD-10-CM

## 2010-07-14 DIAGNOSIS — I2109 ST elevation (STEMI) myocardial infarction involving other coronary artery of anterior wall: Secondary | ICD-10-CM

## 2010-07-14 LAB — GLUCOSE, CAPILLARY
Glucose-Capillary: 314 mg/dL — ABNORMAL HIGH (ref 70–99)
Glucose-Capillary: 235 mg/dL — ABNORMAL HIGH (ref 70–99)
Glucose-Capillary: 207 mg/dL — ABNORMAL HIGH (ref 70–99)

## 2010-07-14 LAB — HEPATIC FUNCTION PANEL
AST: 25 U/L (ref 0–37)
Albumin: 2.5 g/dL — ABNORMAL LOW (ref 3.5–5.2)
Bilirubin, Direct: 0.1 mg/dL (ref 0.0–0.3)

## 2010-07-14 LAB — BASIC METABOLIC PANEL
BUN: 12 mg/dL (ref 6–23)
Chloride: 99 mEq/L (ref 96–112)
GFR calc Af Amer: >60 mL/min (ref >60)
Potassium: 4.6 mEq/L (ref 3.5–5.1)
Sodium: 135 mEq/L (ref 135–145)

## 2010-07-14 LAB — CBC
MCHC: 32.6 g/dL (ref 30.0–36.0)
MCV: 74.3 fL — ABNORMAL LOW (ref 78.0–100.0)
Platelets: 264 10*3/uL (ref 150–400)
RDW: 14.9 % (ref 11.5–15.5)
WBC: 9.5 10*3/uL (ref 4.0–10.5)

## 2010-07-14 LAB — TSH: TSH: 3.702 u[IU]/mL (ref 0.350–4.500)

## 2010-07-14 LAB — POCT ACTIVATED CLOTTING TIME: Activated Clotting Time: 391 seconds

## 2010-07-15 LAB — COMPREHENSIVE METABOLIC PANEL
ALT: 11 U/L (ref 0–53)
AST: 19 U/L (ref 0–37)
Calcium: 8.8 mg/dL (ref 8.4–10.5)
Creatinine, Ser: 0.63 mg/dL (ref 0.50–1.35)
GFR calc non Af Amer: >60 mL/min (ref >60)
Sodium: 138 mEq/L (ref 135–145)
Total Protein: 6.5 g/dL (ref 6.0–8.3)

## 2010-07-15 LAB — LIPID PANEL
HDL: 34 mg/dL — ABNORMAL LOW (ref >39)
LDL Cholesterol: 84 mg/dL (ref 0–99)
Total CHOL/HDL Ratio: 4.6 RATIO
VLDL: 38 mg/dL (ref 0–40)

## 2010-07-15 LAB — HEMOGLOBIN A1C: Mean Plasma Glucose: 235 mg/dL — ABNORMAL HIGH (ref <117)

## 2010-07-15 LAB — CARDIAC PANEL(CRET KIN+CKTOT+MB+TROPI)
Relative Index: 7 — ABNORMAL HIGH (ref 0.0–2.5)
Total CK: 135 U/L (ref 7–232)

## 2010-07-15 LAB — CBC
MCH: 24.4 pg — ABNORMAL LOW (ref 26.0–34.0)
MCHC: 32.4 g/dL (ref 30.0–36.0)
MCV: 75.2 fL — ABNORMAL LOW (ref 78.0–100.0)
Platelets: 322 10*3/uL (ref 150–400)
RBC: 5.17 MIL/uL (ref 4.22–5.81)
RDW: 15.2 % (ref 11.5–15.5)

## 2010-07-15 LAB — GLUCOSE, CAPILLARY
Glucose-Capillary: 174 mg/dL — ABNORMAL HIGH (ref 70–99)
Glucose-Capillary: 203 mg/dL — ABNORMAL HIGH (ref 70–99)
Glucose-Capillary: 124 mg/dL — ABNORMAL HIGH (ref 70–99)

## 2010-07-15 LAB — TROPONIN I: Troponin I: 3.94 ng/mL (ref <0.30)

## 2010-07-16 LAB — GLUCOSE, CAPILLARY
Glucose-Capillary: 191 mg/dL — ABNORMAL HIGH (ref 70–99)
Glucose-Capillary: 125 mg/dL — ABNORMAL HIGH (ref 70–99)
Glucose-Capillary: 149 mg/dL — ABNORMAL HIGH (ref 70–99)

## 2010-07-17 DIAGNOSIS — I2129 ST elevation (STEMI) myocardial infarction involving other sites: Secondary | ICD-10-CM

## 2010-07-17 LAB — GLUCOSE, CAPILLARY
Glucose-Capillary: 122 mg/dL — ABNORMAL HIGH (ref 70–99)
Glucose-Capillary: 150 mg/dL — ABNORMAL HIGH (ref 70–99)

## 2010-07-17 LAB — CBC
MCV: 74.8 fL — ABNORMAL LOW (ref 78.0–100.0)
Platelets: 272 10*3/uL (ref 150–400)
RBC: 5.4 MIL/uL (ref 4.22–5.81)
RDW: 15.3 % (ref 11.5–15.5)
WBC: 10.4 10*3/uL (ref 4.0–10.5)

## 2010-07-17 LAB — BASIC METABOLIC PANEL
CO2: 29 mEq/L (ref 19–32)
Chloride: 98 mEq/L (ref 96–112)
GFR calc Af Amer: >60 mL/min (ref >60)
Potassium: 3.5 mEq/L (ref 3.5–5.1)
Sodium: 137 mEq/L (ref 135–145)

## 2010-08-04 ENCOUNTER — Encounter: Payer: Self-pay | Admitting: Physician Assistant

## 2010-08-05 ENCOUNTER — Encounter: Payer: Medicare Other | Admitting: Physician Assistant

## 2010-08-14 NOTE — Cardiovascular Report (Signed)
NAMEARYN, KOPS NO.:  [id number]  MEDICAL RECORD NO.:  [id number]  LOCATION:  2901                         FACILITY:  [facility]  PHYSICIAN:  Veverly Fells. Excell Seltzer, MD  DATE OF BIRTH:  1945-12-22  DATE OF PROCEDURE:  07/13/2010 DATE OF DISCHARGE:                           CARDIAC CATHETERIZATION   PROCEDURES: 1. Left heart catheterization. 2. Selective coronary angiography. 3. Percutaneous transluminal coronary angioplasty, stenting, and     intravascular ultrasound of the left mainstem. 4. Percutaneous transluminal coronary angioplasty the distal left     anterior descending.  PROCEDURAL INDICATIONS:  Mr. Mukai is a 65 year old morbidly obese, type 2 diabetic gentleman who presented with a lateral ST-segment elevation MI.  A code STEMI was called from the field.  The Cath Lab was called in and the patient was brought to the emergency room because it was after hours.  Upon interviewing the patient, he had ongoing 5/10 substernal chest pain.  His weight is 475 pounds which is above the table limit, but he was close enough that we decided the best way to proceed would be with catheterization and probable PCI.  I discussed the risks and indications of procedure in detail with the patient including his increased risk in the setting of his morbid obesity.  He understood these additional risks as well as the alternative of medical therapy or thrombolysis.  He agreed to proceed.  There was fairly long delay in getting the patient on the cath lab table because logistically he was extremely difficult to move.  We had to ask for many additional staff members to come up from the emergency room in order to get the patient onto the cath lab table.  He was difficult to position on the table as well as to prep and drape because of his size. The patient was premedicated with Versed and fentanyl.  I was able to access his right radial artery with a front wall puncture.  After  the right wrist area was prepped and draped, the patient had been given 4000 units of heparin in the emergency room.  Angiomax was immediately started after the sheath was placed in his right radial artery.  A 6- French sheath was placed without difficulty and a Ikari 4-cm guide catheter was advanced.  This was used to image the right coronary artery.  I had to leave a wire in place in order to image the RCA.  I attempted to image the left coronary artery with this, but it did not fit the vessel well.  It was changed out for an XB LAD 3.5-cm guide. Angiography was performed and this showed critical disease of the distal left mainstem with eccentric plaque and thrombus.  There was a 90% stenosis just before the bifurcation of the LAD and circumflex.  There was also total occlusion of the distal LAD which I suspected was from left main plaque embolus.  Considering the patient's acute infarction and morbid obesity, I felt the best way to proceed was with primary PCI. I did not feel the patient was a reasonable candidate for emergency coronary artery bypass surgery considering his severe comorbidities.  He was given 60  mg of Effient on the table.  Once a therapeutic ACT was achieved, we advanced a Cougar guidewire into the apical portion of the LAD.  I felt the left main needed to be stabilized before trying to open up the distal LAD.  The left main was predilated with 3.0 x 12 mm balloon which was inflated to 10 atmospheres on a single inflation.  A very short 12-second inflation was done.  The vessel was then stented with a 4.0 x 12 mm Promus  Element drug-eluting stent.  The stent was carefully positioned so that there was complete lesion coverage and that the distal marker of the stent was just at the distal left main bifurcation.  The stent was deployed at 14 atmospheres and appeared well expanded.  There was an excellent angiographic result with 0% residual stenosis.  I then attempted  to dilate the distal LAD since the left main had been stabilized.  The wire was advanced into the apical portion of the LAD and a 2.0 x 20 mm Emerge balloon was advanced that was inflated to 4 atmospheres for 2 inflations.  This improved flow into the apical portion of the LAD, but flow was still reduced and was measured at TIMI 2.  There really was no significant stenosis and I think the apical LAD was simply occluded from downstream plaque and thrombus embolization. At that point, attention was turned back to the left main and intravascular ultrasound was performed.  A mechanical pullback was done from the proximal LAD across the stented segment of the left main.  This demonstrated good stent expansion throughout.  There was mal-apposition of the proximal portion of the stent and the stent was re-dilated with a 4.5 x 8 mm Wildwood Quantum apex which was taken to 12 atmospheres on a single inflation.  The final angiographic result was excellent with 0% residual stenosis and TIMI 3 flow.  There was 40% residual stenosis in the apical LAD with TIMI 2 flow.  DIAGNOSTIC FINDINGS:  Aortic pressure 118/63 with a mean of 86.  Left ventricular pressure 116/12.  Left mainstem:  There was mild diffuse plaque and calcification in the proximal and mid left main.  The distal left main has an acentric thrombotic 90-95% stenosis just before the bifurcation of the LAD and left circumflex.  LAD:  The LAD is diffusely diseased.  It is a moderate-caliber vessel that supplies 3 diagonal branches.  The LAD has scattered 50-60% stenosis and the distal LAD shows total occlusion after a septal perforating branch.  Left circumflex:  There is a tiny intermediate branch.  There is also a tiny first OM branch.  The AV groove circumflex has 50-70% stenosis in the midportion leading into an OM-2 branch.  Right coronary artery:  The RCA is large, dominant vessel that is diffusely diseased with 50-60% proximal  stenosis, 50% mid stenosis, and diffuse distal plaquing.  The PDA is large with a 50% stenosis and there is a moderate-caliber posterolateral branch.  There is fairly heavy calcification throughout the course of the RCA.  FINAL ASSESSMENT: 1. 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 2. 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 3. 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  RECOMMENDATIONS:  The patient should remain on aspirin and Effient  for a minimum of 12 months.  We would favor lifelong dual antiplatelet therapy.  We will plan on transferring him to the CCU for post-MI medical care.  The procedure findings were reviewed with the patient's wife and son in detail.     Veverly Fells. Excell Seltzer, MD     MDC/MEDQ  D:  07/14/2010  T:  07/14/2010  Job:  [id number]  Electronically Signed by Tonny Bollman MD on 08/14/2010 12:22:50 AM

## 2010-08-14 NOTE — H&P (Signed)
[name], [name] NO.:  [id number]  MEDICAL RECORD NO.:  [id number]  LOCATION:  2901                         FACILITY:  [facility]  PHYSICIAN:  Craig Fells. Excell Seltzer, MD  DATE OF BIRTH:  10-15-1945  DATE OF ADMISSION:  07/13/2010 DATE OF DISCHARGE:                             HISTORY & PHYSICAL   REASON FOR ADMISSION:  ST elevation MI.  HISTORY OF PRESENT ILLNESS:  [name] is a morbidly obese 65 year old gentleman who developed sudden onset of severe substernal chest pain at 10 p.m. on July 13, 2010.  He had no prodromal symptoms.  The patient is very inactive and he weighs 475 pounds.  He apparently has weighed much more than this in the past.  The patient's EKG showed lateral ST-segment elevation and a code STEMI was called from the field.  The patient also complains of shortness of breath.  He denies lightheadedness, syncope, orthopnea, or PND.  He continues to have 5/10 chest pain on my interview.  PAST MEDICAL HISTORY: 1. Type 2 diabetes, insulin requiring. 2. Hypertension. 3. Hyperlipidemia. 4. Morbid obesity. 5. Severe osteoarthritis of both knees.  PAST MEDICAL HISTORY: 1. History of bilateral lower extremity cellulitis. 2. Severe lymphedema. 3. History of sepsis.  MEDICATIONS:  This medication list is obtained from discharge summary from April 10, 2010.  The patient does not have his medicine list. 1. Nystatin 100,000 units per g powder apply once daily. 2. MiraLax 17 g daily as needed. 3. Atenolol 100 mg daily, 4. Beclomethasone topical q.i.d. as needed. 5. Flexeril 10 mg daily as needed for back spasms. 6. Lasix 40 mg twice daily. 7. Gemfibrozil 600 mg twice daily. 8. Glyburide/metformin 2.5/500 mg 2 tablets twice daily. 9. Humulin N 45 units twice daily. 10.Hydralazine 25 mg twice daily. 11.Lisinopril 40 mg daily. 12.Norvasc 10 mg daily. 13.Pravachol 40 mg daily. 14.Tramadol 50 mg twice daily as needed.  ALLERGIES:  NKDA.  FAMILY  HISTORY:  Strongly positive for CAD.  The patient's brother died of an MI at 85.  His father died of an MI as well.  SOCIAL HISTORY:  The patient is married.  He has a son and a daughter. He is retired.  He does not smoke cigarettes or drink alcohol.  REVIEW OF SYSTEMS:  Negative except as per HPI.  PHYSICAL EXAMINATION:  VITAL SIGNS:  Blood pressure is 128/60, heart rate is 88, respiratory rate is 24, O2 saturation is 92% on 2 liters of oxygen per nasal cannula. GENERAL:  The patient is alert and oriented.  He is a pleasant man in moderate distress.  He is morbidly obese. HEENT:  Normal. NECK:  Unable to evaluate neck veins due to body habitus.  No carotid bruits are appreciated.  No thyromegaly is appreciated.  LUNGS:  Clear bilaterally. HEART:  The apex is not palpable.  Heart sounds are distant.  The heart is regular rate and rhythm without murmurs or gallops appreciated. ABDOMEN:  Soft, obese, nontender. BACK:  No CVA tenderness. EXTREMITIES:  There is marked edema bilaterally with chronic stasis changes in appearance of lymphedema. SKIN:  There is no active cellulitis appreciated. NEUROLOGIC:  The patient moves all  extremities.  Cranial nerves II-XII are intact.  EKG shows sinus rhythm with lateral ST elevation consistent with acute lateral injury and reciprocal changes noted.  Lab work is currently pending.  ASSESSMENT:  This is a morbidly obese 65 year old gentleman with an acute myocardial infarction.  Plan for emergency catheterization plus or minus percutaneous coronary intervention.  The patient understands the increased risk in the setting of his morbid obesity.  However, the only alternatives are medical therapy versus from thrombolysis.  I think, primary percutaneous coronary intervention is preferred therapy, and we will attempt to perform this from a radial approach.  Further plans pending the results of his catheterization.     Craig Fells. Excell Seltzer,  MD     MDC/MEDQ  D:  07/14/2010  T:  07/14/2010  Job:  [id number]  Electronically Signed by Tonny Bollman MD on 08/14/2010 12:22:44 AM

## 2010-08-14 NOTE — Discharge Summary (Signed)
NAMEJEROL, [name] NO.:  [id number]  MEDICAL RECORD NO.:  [id number]  LOCATION:  3728                         FACILITY:  [facility]  PHYSICIAN:  Veverly Fells. Excell Seltzer, MD  DATE OF BIRTH:  [birth year]  DATE OF ADMISSION:  07/13/2010 DATE OF DISCHARGE:  07/17/2010                              DISCHARGE SUMMARY   DISCHARGE DIAGNOSIS:  Acute lateral ST segment elevation myocardial infarction.  SECONDARY DIAGNOSES: 1. Coronary artery disease status post drug-eluting stent placement to     the distal left main and PTCA of the distal left anterior     descending this admission. 2. Hypertension. 3. Hyperlipidemia. 4. Morbid obesity. 5. Nonsustained ventricular tachycardia with normal left ventricular     function. 6. Insulin-dependent diabetes mellitus. 7. Chronic deconditioning.  Seen by Physical Therapy this admission     with recommendation for outpatient physical therapy. 8. Hypokalemia requiring supplementation.  ALLERGIES:  No known drug allergies.  PROCEDURES: 1. Left heart cardiac catheterization performed on July 14, 2010,     revealing 90% stenosis with thrombus in the distal left main.  This     was successfully treated with 4.0 x 12 mm Promus Element Plus drug-     eluting stent.  The distal left anterior descending  was occluded     and this was treated with PTCA.  The patient otherwise had an     nonobstructive disease. 2. A 2D echocardiogram performed on July 14, 2010, showed an EF of 55%     with mild left ventricular hypertrophy.  There is grade 2 diastolic     dysfunction.  He has mild aortic root dilatation.  Normal right     ventricular systolic function.  HISTORY OF PRESENT ILLNESS:  A 65 year old obese white male weighing an approximately 475 pounds.  The patient was in his usual state of health at approximately 10:00 p.m. on July 13, 2010, when he had a sudden onset of severe substernal chest discomfort.  EMS was called, and the patient was  noted to have lateral ST segment elevation.  Code STEMI was called, and the patient was taken emergently to [facility] cath lab.  HOSPITAL COURSE:  The patient underwent diagnostic cardiac catheterization on early morning hours of July 14, 2010, revealing 90% stenosis in the distal left main with thrombus burden.  He also had total occlusion of the distal LAD.  The left main was stented with a 4.0 x 12 mm Promus Element Plus drug-eluting stent while the LAD was treated with balloon angioplasty only.  The patient otherwise had nonobstructive disease.  He was loaded with Effient, and also maintained on aspirin, beta-blocker, ACE inhibitor, and high-dose statin therapy, and transferred to the coronary intensive care unit.  He eventually peaked his CK at 135, MB at 9.5, troponin I at 5.01.  He was noted to have rare occurrences of asymptomatic nonsustained ventricular tachycardia, which have since resolved.  A 2D echocardiogram was undertaken on July 14, 2010, revealing normal LV function and grade 2 diastolic dysfunction.  The patient was transferred out to the floor on July 15, 2010, has been evaluated by Physical Therapy as well as  Cardiac Rehab.  Physical Therapy has recommended bariatric crutches given the patient's limited mobility as well as home health PT.  This has been arranged by case management.  The patient has had no recurrence of chest discomfort and will be discharged home today in good condition.  DISCHARGE LABORATORY DATA:  Hemoglobin 13.0, hematocrit 40.4, WBC 10.4, platelets 272, INR 0.95.  Sodium 137, potassium 3.5, chloride 98, CO2 of 29, BUN 12, creatinine 0.73, glucose 99, total bilirubin 0.3, alkaline phosphatase 89, AST 19, ALT 11, total protein 6.5, albumin 2.6, calcium 8.5, hemoglobin A1c 9.8, CK 135, MB 9.5, troponin I 3.94, total cholesterol 156, triglycerides 180, HDL 34, LDL 84.  TSH 3.702.  MRSA screen was negative.  DISPOSITION:  The patient will be  discharged home today in good condition.  FOLLOWUP PLANS AND APPOINTMENTS:  We have arranged for follow up with Craig Newcomer, PA in our office on July 06, 2010, at 9:00 a.m.  The patient will follow up Dr. Excell Seltzer going forward.  DISCHARGE MEDICATIONS: 1. Atorvastatin 80 mg at bedtime. 2. Aspirin 81 mg daily. 3. Carvedilol 6.25 mg b.i.d. 4. Nitroglycerin 0.4 mg sublingual p.r.n. chest pain. 5. Potassium chloride 20 mEq daily. 6. Proscar 10 mg daily. 7. Lisinopril 20 mg daily. 8. Betamethasone topical q.i.d. p.r.n. 9. Flexeril 10 mg daily p.r.n. 10.Furosemide 40 mg 2 tablets daily. 11.Gemfibrozil 600 mg b.i.d. 12.Glyburide/metformin 2.5/500 mg 2 tablets b.i.d. 13.Humulin N 45 units b.i.d. 14.Nystatin 100,000 units per gram one application daily. 15.Tramadol 50 mg b.i.d.  OUTSTANDING LABORATORY DATA AND STUDIES:  None.  DURATION OF DISCHARGE ENCOUNTER:  60 minutes including physician time.     Nicolasa Ducking, ANP   ______________________________ Veverly Fells. Excell Seltzer, MD    CB/MEDQ  D:  07/17/2010  T:  07/18/2010  Job:  [id number]  cc:   Dr. Marcello Fennel  Electronically Signed by Nicolasa Ducking ANP on 07/18/2010 02:34:59 PM Electronically Signed by Tonny Bollman MD on 08/14/2010 12:22:34 AM

## 2010-08-22 ENCOUNTER — Encounter: Payer: Self-pay | Admitting: Physician Assistant

## 2010-08-22 ENCOUNTER — Ambulatory Visit (INDEPENDENT_AMBULATORY_CARE_PROVIDER_SITE_OTHER): Payer: Medicare Other | Admitting: Physician Assistant

## 2010-08-22 VITALS — BP 107/55 | HR 85 | Resp 20 | Ht 72.0 in | Wt >= 6400 oz

## 2010-08-22 DIAGNOSIS — I2510 Atherosclerotic heart disease of native coronary artery without angina pectoris: Secondary | ICD-10-CM

## 2010-08-22 DIAGNOSIS — E785 Hyperlipidemia, unspecified: Secondary | ICD-10-CM

## 2010-08-22 DIAGNOSIS — I10 Essential (primary) hypertension: Secondary | ICD-10-CM

## 2010-08-22 MED ORDER — PRASUGREL HCL 10 MG PO TABS: 10.0000 mg | ORAL_TABLET | Freq: Every day | ORAL | Status: DC

## 2010-08-22 NOTE — Progress Notes (Signed)
History of Present Illness: Primary Cardiologist: Dr. Tonny Bollman  [name] is a 65 y.o. male who presents for post hospital follow up.   He was admitted 6/17-6/21.  He presented with sudden onset of chest pain.  He had lateral ST segment elevation.  He was taken emergently to the cardiac catheterization lab for NSTEMI.  This demonstrated a 90% stenosis in the distal left main with thrombus burden.  There was also total occlusion of the distal LAD.  He was treated with a drug-eluting stent to the distal left main and POBA to the distal LAD.  He had mild to moderate disease elsewhere.  LV function was noted be normal on echocardiogram.  He did have some post-PCI nonsustained ventricular tachycardia.  This eventually resolved.  He was set up with home health physical therapy due to his decreased mobility secondary to morbid obesity.  He has declined home health PT.  He was in some type of PT program before his MI.  He is interested in doing something like this.  He states he is taking his Effient and ASA.  He denies any further chest pain.  No syncope.  He sleeps in a hospital bed.  No PND.  No syncope.  He is dyspneic with exertion.  This is no worse.  He notes chronic edema.  This is improved.  Past Medical History  Diagnosis Date  . Coronary artery disease     a. s/p Lat STEMI 07/13/10: tx with Promus DES to distal LM; s/p POBA to distal LAD;  b. cath 07/13/10: dLM thrombotic 90-95%, LAD scattered 50-60%, dLAD occluded, AVCFX 50-70%, pRCA 50-60%, mRCA 50%, PDA 50%;    c. echo 07/14/10: EF 55%, mild LVH, mild LAE, grade 2 diast dysfxn, Ao root 38 mm  . Hypertension   . Hyperlipidemia   . Morbid obesity   . NSVT (nonsustained ventricular tachycardia)     post MI; normal LVF   . Diabetes mellitus      Insulin-dependent  . Physical deconditioning     requiring outpatient PT  . Osteoarthritis     Current Outpatient Prescriptions  Medication Sig Dispense Refill  . aspirin 81 MG tablet Take 81 mg  by mouth daily.        Marland Kitchen atorvastatin (LIPITOR) 80 MG tablet Take 80 mg by mouth daily.        . carvedilol (COREG) 6.25 MG tablet Take 6.25 mg by mouth 2 (two) times daily with a meal.        . furosemide (LASIX) 40 MG tablet Take 40 mg by mouth 2 (two) times daily.        Marland Kitchen glipiZIDE-metformin (METAGLIP) 2.5-500 MG per tablet Take 2 tablets by mouth 2 (two) times daily before a meal.        . insulin NPH-insulin regular (NOVOLIN 70/30) (70-30) 100 UNIT/ML injection Inject 45 Units into the skin 2 (two) times daily with a meal.        . lisinopril (PRINIVIL,ZESTRIL) 20 MG tablet Take 20 mg by mouth daily.        . nitroGLYCERIN (NITROSTAT) 0.4 MG SL tablet Place 0.4 mg under the tongue every 5 (five) minutes as needed.        . potassium chloride SA (K-DUR,KLOR-CON) 20 MEQ tablet Take 20 mEq by mouth daily.        . traMADol (ULTRAM) 50 MG tablet Take 50 mg by mouth every 6 (six) hours as needed.  Allergies: No Known Allergies  Social Hx:  Non smoker  Vital Signs: BP 107/55  Pulse 85  Resp 20  Ht 6' (1.829 m)  Wt 424 lb 6.4 oz (192.507 kg)  BMI 57.56 kg/m2  SpO2 97%  PHYSICAL EXAM: Morbidly obese male, in no acute distress HEENT: normal Neck: no JVD at 90 degrees Cardiac:  normal S1, S2; RRR; no murmur Lungs:  clear to auscultation bilaterally, no wheezing, rhonchi or rales Abd: soft, nontender, no hepatomegaly Ext: 1+ brawny bilateral edema;  Right radial site without hematoma or bruit Skin: warm and dry Neuro:  CNs 2-12 intact, no focal abnormalities noted  EKG:  Sinus rhythm, heart rate 83, poor R-wave progression, low voltage, nonspecific ST-T wave changes  ASSESSMENT AND PLAN:

## 2010-08-22 NOTE — Assessment & Plan Note (Signed)
Managed by PCP.   He was changed to high dose atorvastatin in the hospital.  He will need Lipids and LFTs checked in 2 months.  Goal LDL is < 70.

## 2010-08-22 NOTE — Assessment & Plan Note (Signed)
Not sure how much cardiac rehab can offer him.  Hopefully they can work with him to increase his activity.  We discussed continued efforts at weight loss. He states he has lost a lot of weight over the last year.

## 2010-08-22 NOTE — Assessment & Plan Note (Signed)
Lisinopril increased in hospital.  Check a BMET.  BP controlled.

## 2010-08-22 NOTE — Assessment & Plan Note (Signed)
Stable post MI.  He is compliant with ASA and Effient.  We discussed the importance of dual antiplatelet therapy.  He would like to pursue some type of rehab.  I will make a referral to cardiac rehab at [place] to see what they can offer him.  He will follow up with Dr. Excell Seltzer in 6 weeks.

## 2010-08-22 NOTE — Patient Instructions (Signed)
Your physician recommends that you schedule a follow-up appointment in: 6 WEEKS WITH DR. Shirlee Latch AS PER SCOTT WEAVER, ;PA-C  You have been referred to CARDIAC REHAB @ [facility] DX [phone], 401.9  YOU HAVE BEEN GIVEN A PRESCRIPTION TO HAVE SOME LAB WORK DRAWN AT [facility] PRIMARY CARE PHYSICIAN OFFICE , PLEASE HAVE THEM FAX RESULTS TO [place], PA-C [phone].  A PRESCRIPTION FOR EFFIENT WAS SENT TO [facility] TODAY FOR YOU.

## 2010-10-02 ENCOUNTER — Encounter: Payer: Medicare Other | Admitting: Internal Medicine

## 2010-10-08 ENCOUNTER — Ambulatory Visit: Payer: Medicare Other | Admitting: Cardiology

## 2010-10-27 ENCOUNTER — Encounter: Payer: Medicare Other | Admitting: Internal Medicine

## 2010-11-11 ENCOUNTER — Ambulatory Visit: Payer: Medicare Other | Admitting: Cardiology

## 2010-11-27 ENCOUNTER — Encounter: Payer: Medicare Other | Admitting: Internal Medicine

## 2010-12-02 ENCOUNTER — Ambulatory Visit: Payer: Medicare Other | Admitting: Cardiovascular Disease

## 2010-12-15 ENCOUNTER — Ambulatory Visit: Payer: Medicare Other | Admitting: Cardiology

## 2010-12-27 ENCOUNTER — Encounter: Payer: Medicare Other | Admitting: Internal Medicine

## 2011-01-21 ENCOUNTER — Ambulatory Visit: Payer: Medicare Other | Admitting: Cardiovascular Disease

## 2011-01-27 ENCOUNTER — Encounter: Payer: Self-pay | Admitting: Internal Medicine

## 2011-02-19 ENCOUNTER — Encounter: Payer: Self-pay | Admitting: Cardiovascular Disease

## 2011-02-19 ENCOUNTER — Ambulatory Visit (INDEPENDENT_AMBULATORY_CARE_PROVIDER_SITE_OTHER): Payer: Medicare Other | Admitting: Cardiovascular Disease

## 2011-02-19 VITALS — BP 140/60 | HR 95 | Ht 72.0 in | Wt >= 6400 oz

## 2011-02-19 DIAGNOSIS — I10 Essential (primary) hypertension: Secondary | ICD-10-CM

## 2011-02-19 DIAGNOSIS — I2510 Atherosclerotic heart disease of native coronary artery without angina pectoris: Secondary | ICD-10-CM

## 2011-02-19 NOTE — Assessment & Plan Note (Signed)
The patient is stable without angina. He has no symptoms of heart failure. I would recommend remaining on dual antiplatelet therapy with aspirin and effient lifelong. At the patient's request, he would like to followup with his primary care physician and limit his followup here to an as-needed basis.  He will continue on his current medical therapy without changes. I would be happy to see him back at anytime if he developed cardiac problems in the future.

## 2011-02-19 NOTE — Assessment & Plan Note (Signed)
Controlled on combination of coreg and lisinopril

## 2011-02-19 NOTE — Progress Notes (Signed)
HPI:  This is a 66 year old gentleman presented for followup evaluation. The patient has morbid obesity, diabetes, hypertension, and coronary artery disease. He presented in June, 2012 with an acute myocardial infarction. He was found to have a ruptured plaque with severe stenosis in the left main and he underwent PCI utilizing a drug-eluting stent. He has been maintained on aspirin and effient. He has had no recurrent ischemic events. Overall he feels better than he did prior to his infarct. He has less shortness of breath. His weight has been as high as 550 pounds, but he has been able to lose a significant amount of weight through diet and diuresis. In the past he has had marked edema which is improved at present.  The patient has been scheduled for followup on several occasions and he has missed all of his post hospital followup visits with me. He has a difficult time getting out of his house. The patient walks with crutches.  He specifically denies chest pain or pressure. He denies orthopnea, PND, lightheadedness, or syncope. He reports compliance with his medications.  Outpatient Encounter Prescriptions as of 02/19/2011  Medication Sig Dispense Refill  . aspirin 81 MG tablet Take 81 mg by mouth daily.        Marland Kitchen atorvastatin (LIPITOR) 80 MG tablet Take 80 mg by mouth daily.        . carvedilol (COREG) 6.25 MG tablet Take 6.25 mg by mouth 2 (two) times daily with a meal.        . furosemide (LASIX) 40 MG tablet Take 40 mg by mouth 2 (two) times daily.        Marland Kitchen glipiZIDE-metformin (METAGLIP) 2.5-500 MG per tablet Take 2 tablets by mouth 2 (two) times daily before a meal.        . insulin NPH-insulin regular (NOVOLIN 70/30) (70-30) 100 UNIT/ML injection Inject 60 Units into the skin 2 (two) times daily with a meal.       . lisinopril (PRINIVIL,ZESTRIL) 20 MG tablet Take 20 mg by mouth daily.        . nitroGLYCERIN (NITROSTAT) 0.4 MG SL tablet Place 0.4 mg under the tongue every 5 (five) minutes as  needed.        . potassium chloride SA (K-DUR,KLOR-CON) 20 MEQ tablet Take 20 mEq by mouth daily.        . prasugrel (EFFIENT) 10 MG TABS Take 1 tablet (10 mg total) by mouth daily.  90 tablet  3  . traMADol (ULTRAM) 50 MG tablet Take 50 mg by mouth every 6 (six) hours as needed.          No Known Allergies  Past Medical History  Diagnosis Date  . Coronary artery disease     a. s/p Lat STEMI 07/13/10: tx with Promus DES to distal LM; s/p POBA to distal LAD;  b. cath 07/13/10: dLM thrombotic 90-95%, LAD scattered 50-60%, dLAD occluded, AVCFX 50-70%, pRCA 50-60%, mRCA 50%, PDA 50%;    c. echo 07/14/10: EF 55%, mild LVH, mild LAE, grade 2 diast dysfxn, Ao root 38 mm  . Hypertension   . Hyperlipidemia   . Morbid obesity   . NSVT (nonsustained ventricular tachycardia)     post MI; normal LVF   . Diabetes mellitus      Insulin-dependent  . Physical deconditioning     requiring outpatient PT  . Osteoarthritis     ROS: Negative except as per HPI  BP 140/60  Pulse 95  Ht 6' (1.829 m)  Wt 186.61 kg (411 lb 6.4 oz)  BMI 55.80 kg/m2  PHYSICAL EXAM: Pt is alert and oriented, morbidly obese male in NAD HEENT: normal Neck: JVP - normal, carotids 2+= without bruits Lungs: CTA bilaterally CV: RRR without murmur or gallop Abd: soft, NT, Positive BS, no hepatomegaly Ext: there is marked pretibial edema with chronic stasis changes bilaterally Skin: warm/dry no rash  EKG:  Sinus rhythm with occasional PVCs, heart rate 95 beats per minute. Possible anterior infarct age undetermined.  ASSESSMENT AND PLAN:

## 2011-02-19 NOTE — Patient Instructions (Signed)
Your physician recommends that you continue on your current medications as directed. Please refer to the Current Medication list given to you today.   Your physician recommends that you schedule a follow-up appointment as needed  

## 2011-02-27 ENCOUNTER — Encounter: Payer: Self-pay | Admitting: Internal Medicine

## 2011-03-30 ENCOUNTER — Encounter: Payer: Self-pay | Admitting: Internal Medicine

## 2011-04-20 ENCOUNTER — Encounter: Payer: Self-pay | Admitting: Internal Medicine

## 2011-04-27 ENCOUNTER — Encounter: Payer: Self-pay | Admitting: Internal Medicine

## 2011-05-27 ENCOUNTER — Encounter: Payer: Self-pay | Admitting: Internal Medicine

## 2011-11-13 ENCOUNTER — Inpatient Hospital Stay: Payer: Self-pay | Admitting: Internal Medicine

## 2011-11-13 DIAGNOSIS — R079 Chest pain, unspecified: Secondary | ICD-10-CM

## 2011-11-13 LAB — BASIC METABOLIC PANEL
BUN: 7 mg/dL (ref 7–18)
Calcium, Total: 8.9 mg/dL (ref 8.5–10.1)
Co2: 28 mmol/L (ref 21–32)
Creatinine: 0.83 mg/dL (ref 0.60–1.30)
EGFR (African American): >60
EGFR (Non-African Amer.): >60
Glucose: 289 mg/dL — ABNORMAL HIGH (ref 65–99)
Osmolality: 286 (ref 275–301)
Sodium: 139 mmol/L (ref 136–145)

## 2011-11-13 LAB — PROTIME-INR
INR: 1
Prothrombin Time: 13.4 secs (ref 11.5–14.7)

## 2011-11-13 LAB — TROPONIN I
Troponin-I: 0.12 ng/mL — ABNORMAL HIGH
Troponin-I: 0.14 ng/mL — ABNORMAL HIGH
Troponin-I: 0.17 ng/mL — ABNORMAL HIGH

## 2011-11-13 LAB — CBC
HGB: 15.6 g/dL (ref 13.0–18.0)
MCH: 26.4 pg (ref 26.0–34.0)
MCV: 80 fL (ref 80–100)
RBC: 5.9 10*6/uL (ref 4.40–5.90)
RDW: 14.7 % — ABNORMAL HIGH (ref 11.5–14.5)

## 2011-11-13 LAB — APTT
Activated PTT: 28.3 secs (ref 23.6–35.9)
Activated PTT: 29.7 secs (ref 23.6–35.9)

## 2011-11-13 LAB — CK TOTAL AND CKMB (NOT AT ARMC)
CK-MB: 0.8 ng/mL (ref 0.5–3.6)
CK-MB: <0.5 ng/mL — ABNORMAL LOW (ref 0.5–3.6)
CK-MB: 0.7 ng/mL (ref 0.5–3.6)

## 2011-11-13 LAB — PRO B NATRIURETIC PEPTIDE: B-Type Natriuretic Peptide: 357 pg/mL — ABNORMAL HIGH (ref 0–125)

## 2011-11-14 LAB — BASIC METABOLIC PANEL
Anion Gap: 8 (ref 7–16)
BUN: 8 mg/dL (ref 7–18)
Chloride: 108 mmol/L — ABNORMAL HIGH (ref 98–107)
Co2: 28 mmol/L (ref 21–32)
Creatinine: 0.76 mg/dL (ref 0.60–1.30)
EGFR (African American): >60
EGFR (Non-African Amer.): >60
Glucose: 92 mg/dL (ref 65–99)
Osmolality: 285 (ref 275–301)
Potassium: 3.5 mmol/L (ref 3.5–5.1)
Sodium: 144 mmol/L (ref 136–145)

## 2011-11-14 LAB — LIPID PANEL
HDL Cholesterol: 31 mg/dL — ABNORMAL LOW (ref 40–60)
Triglycerides: 168 mg/dL (ref 0–200)

## 2011-11-14 LAB — CBC WITH DIFFERENTIAL/PLATELET
Basophil #: 0.1 10*3/uL (ref 0.0–0.1)
Eosinophil #: 0.6 10*3/uL (ref 0.0–0.7)
MCH: 27 pg (ref 26.0–34.0)
MCHC: 33.6 g/dL (ref 32.0–36.0)
Monocyte #: 0.8 x10 3/mm (ref 0.2–1.0)
Neutrophil %: 64 %
Platelet: 225 10*3/uL (ref 150–440)

## 2011-11-14 LAB — TROPONIN I: Troponin-I: 0.17 ng/mL — ABNORMAL HIGH

## 2011-11-14 LAB — HEMOGLOBIN A1C: Hemoglobin A1C: 10.7 % — ABNORMAL HIGH (ref 4.2–6.3)

## 2011-11-14 LAB — CK TOTAL AND CKMB (NOT AT ARMC): CK-MB: 0.9 ng/mL (ref 0.5–3.6)

## 2011-11-14 LAB — APTT: Activated PTT: 32.5 secs (ref 23.6–35.9)

## 2011-11-15 LAB — CBC WITH DIFFERENTIAL/PLATELET
Basophil #: 0 10*3/uL (ref 0.0–0.1)
Eosinophil #: 0.6 10*3/uL (ref 0.0–0.7)
HCT: 44 % (ref 40.0–52.0)
HGB: 14.6 g/dL (ref 13.0–18.0)
Lymphocyte #: 1.5 10*3/uL (ref 1.0–3.6)
MCH: 26.5 pg (ref 26.0–34.0)
MCHC: 33.2 g/dL (ref 32.0–36.0)
MCV: 80 fL (ref 80–100)
Monocyte #: 0.7 x10 3/mm (ref 0.2–1.0)
Monocyte %: 9.6 %
Neutrophil #: 4.6 10*3/uL (ref 1.4–6.5)
Platelet: 238 10*3/uL (ref 150–440)
RDW: 15.1 % — ABNORMAL HIGH (ref 11.5–14.5)
WBC: 7.4 10*3/uL (ref 3.8–10.6)

## 2011-11-15 LAB — APTT: Activated PTT: 63.8 secs — ABNORMAL HIGH (ref 23.6–35.9)

## 2011-11-15 LAB — BASIC METABOLIC PANEL
Anion Gap: 10 (ref 7–16)
BUN: 8 mg/dL (ref 7–18)
Chloride: 108 mmol/L — ABNORMAL HIGH (ref 98–107)
Co2: 26 mmol/L (ref 21–32)
Creatinine: 0.67 mg/dL (ref 0.60–1.30)
Glucose: 78 mg/dL (ref 65–99)
Potassium: 3.4 mmol/L — ABNORMAL LOW (ref 3.5–5.1)

## 2012-01-27 HISTORY — PX: CARDIAC CATHETERIZATION: SHX172

## 2012-03-20 ENCOUNTER — Inpatient Hospital Stay: Payer: Self-pay | Admitting: Internal Medicine

## 2012-03-20 ENCOUNTER — Encounter (HOSPITAL_COMMUNITY): Payer: Self-pay | Admitting: *Deleted

## 2012-03-20 ENCOUNTER — Emergency Department (HOSPITAL_COMMUNITY): Payer: Medicare Other

## 2012-03-20 ENCOUNTER — Emergency Department (HOSPITAL_COMMUNITY)
Admission: EM | Admit: 2012-03-20 | Discharge: 2012-03-20 | Disposition: A | Discharge status: Home / Self Care | Payer: Medicare Other | Attending: Emergency Medicine | Admitting: Emergency Medicine

## 2012-03-20 DIAGNOSIS — Z794 Long term (current) use of insulin: Secondary | ICD-10-CM | POA: Insufficient documentation

## 2012-03-20 DIAGNOSIS — R609 Edema, unspecified: Secondary | ICD-10-CM | POA: Insufficient documentation

## 2012-03-20 DIAGNOSIS — Z7982 Long term (current) use of aspirin: Secondary | ICD-10-CM | POA: Insufficient documentation

## 2012-03-20 DIAGNOSIS — Y9301 Activity, walking, marching and hiking: Secondary | ICD-10-CM | POA: Insufficient documentation

## 2012-03-20 DIAGNOSIS — Z79899 Other long term (current) drug therapy: Secondary | ICD-10-CM | POA: Insufficient documentation

## 2012-03-20 DIAGNOSIS — I2510 Atherosclerotic heart disease of native coronary artery without angina pectoris: Secondary | ICD-10-CM | POA: Insufficient documentation

## 2012-03-20 DIAGNOSIS — Z602 Problems related to living alone: Secondary | ICD-10-CM | POA: Insufficient documentation

## 2012-03-20 DIAGNOSIS — W06XXXA Fall from bed, initial encounter: Secondary | ICD-10-CM | POA: Insufficient documentation

## 2012-03-20 DIAGNOSIS — R5381 Other malaise: Secondary | ICD-10-CM | POA: Insufficient documentation

## 2012-03-20 DIAGNOSIS — E119 Type 2 diabetes mellitus without complications: Secondary | ICD-10-CM | POA: Insufficient documentation

## 2012-03-20 DIAGNOSIS — R0789 Other chest pain: Secondary | ICD-10-CM | POA: Insufficient documentation

## 2012-03-20 DIAGNOSIS — I10 Essential (primary) hypertension: Secondary | ICD-10-CM | POA: Insufficient documentation

## 2012-03-20 DIAGNOSIS — R531 Weakness: Secondary | ICD-10-CM

## 2012-03-20 DIAGNOSIS — S4980XA Other specified injuries of shoulder and upper arm, unspecified arm, initial encounter: Secondary | ICD-10-CM | POA: Insufficient documentation

## 2012-03-20 DIAGNOSIS — M25511 Pain in right shoulder: Secondary | ICD-10-CM

## 2012-03-20 DIAGNOSIS — M1990 Unspecified osteoarthritis, unspecified site: Secondary | ICD-10-CM | POA: Insufficient documentation

## 2012-03-20 DIAGNOSIS — Y92009 Unspecified place in unspecified non-institutional (private) residence as the place of occurrence of the external cause: Secondary | ICD-10-CM | POA: Insufficient documentation

## 2012-03-20 DIAGNOSIS — S46909A Unspecified injury of unspecified muscle, fascia and tendon at shoulder and upper arm level, unspecified arm, initial encounter: Secondary | ICD-10-CM | POA: Insufficient documentation

## 2012-03-20 DIAGNOSIS — I252 Old myocardial infarction: Secondary | ICD-10-CM | POA: Insufficient documentation

## 2012-03-20 DIAGNOSIS — E785 Hyperlipidemia, unspecified: Secondary | ICD-10-CM | POA: Insufficient documentation

## 2012-03-20 DIAGNOSIS — W19XXXA Unspecified fall, initial encounter: Secondary | ICD-10-CM

## 2012-03-20 LAB — BASIC METABOLIC PANEL
CO2: 24 mEq/L (ref 19–32)
Calcium: 9.2 mg/dL (ref 8.4–10.5)
Chloride: 98 mEq/L (ref 96–112)
Creatinine, Ser: 0.77 mg/dL (ref 0.50–1.35)
GFR calc Af Amer: >90 mL/min (ref >90)
Sodium: 135 mEq/L (ref 135–145)

## 2012-03-20 LAB — URINALYSIS, COMPLETE
Bilirubin,UR: NEGATIVE
Ph: 5 (ref 4.5–8.0)
Protein: NEGATIVE
RBC,UR: NONE SEEN /HPF (ref 0–5)
Specific Gravity: 1.03 (ref 1.003–1.030)
Squamous Epithelial: NONE SEEN
WBC UR: <1 /HPF (ref 0–5)

## 2012-03-20 LAB — URINALYSIS, ROUTINE W REFLEX MICROSCOPIC
Bilirubin Urine: NEGATIVE
Hgb urine dipstick: NEGATIVE
Nitrite: NEGATIVE
Protein, ur: NEGATIVE mg/dL
Urobilinogen, UA: 1 mg/dL (ref 0.0–1.0)

## 2012-03-20 LAB — POCT I-STAT TROPONIN I: Troponin i, poc: 0.02 ng/mL (ref 0.00–0.08)

## 2012-03-20 LAB — CBC WITH DIFFERENTIAL/PLATELET
Basophils Absolute: 0 10*3/uL (ref 0.0–0.1)
Eosinophils Relative: 0 % (ref 0–5)
Lymphs Abs: 0.5 10*3/uL — ABNORMAL LOW (ref 0.7–4.0)
MCV: 81.1 fL (ref 78.0–100.0)
Monocytes Relative: 5 % (ref 3–12)
Neutrophils Relative %: 93 % — ABNORMAL HIGH (ref 43–77)
Platelets: 233 10*3/uL (ref 150–400)
RBC: 5.82 MIL/uL — ABNORMAL HIGH (ref 4.22–5.81)
RDW: 13.7 % (ref 11.5–15.5)
WBC: 26.5 10*3/uL — ABNORMAL HIGH (ref 4.0–10.5)

## 2012-03-20 LAB — COMPREHENSIVE METABOLIC PANEL
Alkaline Phosphatase: 106 U/L (ref 50–136)
Anion Gap: 5 — ABNORMAL LOW (ref 7–16)
BUN: 10 mg/dL (ref 7–18)
Bilirubin,Total: 0.9 mg/dL (ref 0.2–1.0)
Creatinine: 0.86 mg/dL (ref 0.60–1.30)
EGFR (African American): >60
EGFR (Non-African Amer.): >60
Potassium: 3.6 mmol/L (ref 3.5–5.1)
SGPT (ALT): 33 U/L (ref 12–78)
Total Protein: 6.6 g/dL (ref 6.4–8.2)

## 2012-03-20 LAB — URINE MICROSCOPIC-ADD ON

## 2012-03-20 LAB — CK TOTAL AND CKMB (NOT AT ARMC)
CK, Total: 126 U/L (ref 35–232)
CK-MB: 6 ng/mL — ABNORMAL HIGH (ref 0.5–3.6)

## 2012-03-20 LAB — CBC
HGB: 14.4 g/dL (ref 13.0–18.0)
MCH: 26.8 pg (ref 26.0–34.0)
RDW: 14.3 % (ref 11.5–14.5)
WBC: 21.7 10*3/uL — ABNORMAL HIGH (ref 3.8–10.6)

## 2012-03-20 LAB — LIPASE, BLOOD: Lipase: 73 U/L (ref 73–393)

## 2012-03-20 LAB — TROPONIN I: Troponin-I: 2.7 ng/mL — ABNORMAL HIGH

## 2012-03-20 MED ORDER — ACETAMINOPHEN 325 MG PO TABS
650.0000 mg | ORAL_TABLET | Freq: Once | ORAL | Status: AC
  Administered 2012-03-20: 650 mg via ORAL
  Filled 2012-03-20: qty 2

## 2012-03-20 MED ORDER — ASPIRIN 81 MG PO CHEW
324.0000 mg | CHEWABLE_TABLET | Freq: Once | ORAL | Status: AC
  Administered 2012-03-20: 324 mg via ORAL
  Filled 2012-03-20: qty 4

## 2012-03-20 MED ORDER — GI COCKTAIL ~~LOC~~
30.0000 mL | Freq: Once | ORAL | Status: AC
  Administered 2012-03-20: 30 mL via ORAL
  Filled 2012-03-20: qty 30

## 2012-03-20 NOTE — ED Notes (Signed)
Pt arrived from home where he lives alone via [place] c/o fall on right shoulder and ankle pain. Pt wants facility placement, due to being unable to care for himself.

## 2012-03-20 NOTE — Progress Notes (Signed)
Attempted call to home to arrange [facility] and left message on vm for pt to return call. Isidoro Donning RN CCM Case Mgmt phone [phone]

## 2012-03-20 NOTE — Progress Notes (Signed)
NCM spoke to pt and states he really needs someone to clean his home. Provided pt with list for [facility]. States he will review and let NCM which agency he wants to use. Will arrange [facility] PT, RN and aide for home. States his wife is in a SNF and he thinks he will eventually need SNF. He states he has difficulty with caring for himself at home. Isidoro Donning RN CCM Case Mgmt phone [phone]

## 2012-03-20 NOTE — ED Provider Notes (Signed)
History     CSN: [id number]  Arrival date & time 03/20/12  0555   First MD Initiated Contact with Patient 03/20/12 0559      Chief Complaint  Patient presents with  . Fall    (Consider location/radiation/quality/duration/timing/severity/associated sxs/prior treatment) HPI Comments: Patient presents after a fall that occurred approximately one hour prior to arrival in the ED.  He reports that he fell out of bed and landed on his right shoulder.  He felt weak and was not able to get up right away. He estimates that he was on the ground for 20 minutes before EMS arrived.  He did not hit his head.  No LOC.  He is currently having pain in his right shoulder.   Patient is also complaining of chest pain that began after he fell.  He reports that the pain has been present for the past hour and has been constant.  He states that he has had the pain intermittently over the past year.  He states that the pain feels similar to his GERD.  Pain worse with lying down flat.  He has a history of MI one year ago and had a Coronary stent placed at that time.  He states that his pain does not feel similar to the pain that he had with his MI.  He is currently on Effient and 81 mg Aspirin daily.  He denies shortness of breath.  Denies dizziness or lightheadedness.  Denies numbness or tingling.  Denies focal weakness.  No headache.  No SOB.  He has a history of Morbid Obesity, HTN, Hyperlipidemia, and DM.  He did not take his Insulin or any of his other medications today.    He also feels as if he is unable to care for himself at home.  He is requesting to have SW consulted to help set him up with a home health aide.  He currently lives at home by himself.  He does not want placement at this time.  Patient is a 67 y.o. male presenting with fall. The history is provided by the patient.  Fall The accident occurred 1 to 2 hours ago. The fall occurred while walking. Distance fallen: fall from standing position. He landed  on carpet. There was no blood loss. The point of impact was the right shoulder. The pain is present in the right shoulder. He was not ambulatory at the scene. There was no drug use involved in the accident. Pertinent negatives include no fever, no numbness, no nausea, no vomiting and no headaches. He has tried nothing for the symptoms.    Past Medical History  Diagnosis Date  . Coronary artery disease     a. s/p Lat STEMI 07/13/10: tx with Promus DES to distal LM; s/p POBA to distal LAD;  b. cath 07/13/10: dLM thrombotic 90-95%, LAD scattered 50-60%, dLAD occluded, AVCFX 50-70%, pRCA 50-60%, mRCA 50%, PDA 50%;    c. echo 07/14/10: EF 55%, mild LVH, mild LAE, grade 2 diast dysfxn, Ao root 38 mm  . Hypertension   . Hyperlipidemia   . Morbid obesity   . NSVT (nonsustained ventricular tachycardia)     post MI; normal LVF   . Diabetes mellitus      Insulin-dependent  . Physical deconditioning     requiring outpatient PT  . Osteoarthritis     Past Surgical History  Procedure Laterality Date  . Cardiac catheterization    . Coronary stent placement      drug-eluting to the  distal left main and PTCA of the distal left anterior descending.    Family History  Problem Relation Age of Onset  . Heart attack Brother 60    died  . Coronary artery disease      strongly positive family hx of    History  Substance Use Topics  . Smoking status: Never Smoker   . Smokeless tobacco: Not on file  . Alcohol Use: No      Review of Systems  Constitutional: Negative for fever and chills.  HENT: Negative for neck pain and neck stiffness.   Eyes: Negative for visual disturbance.  Respiratory: Negative for shortness of breath.   Cardiovascular: Positive for chest pain and leg swelling.  Gastrointestinal: Negative for nausea and vomiting.  Musculoskeletal:       Right shoulder pain  Skin: Negative for color change.  Neurological: Negative for dizziness, syncope, numbness and headaches.        Generalized weakness  Psychiatric/Behavioral: Negative for confusion.  All other systems reviewed and are negative.    Allergies  Review of patient's allergies indicates no known allergies.  Home Medications   Current Outpatient Rx  Name  Route  Sig  Dispense  Refill  . aspirin 81 MG tablet   Oral   Take 81 mg by mouth daily.           Marland Kitchen atorvastatin (LIPITOR) 80 MG tablet   Oral   Take 80 mg by mouth daily.           . carvedilol (COREG) 6.25 MG tablet   Oral   Take 6.25 mg by mouth 2 (two) times daily with a meal.           . furosemide (LASIX) 40 MG tablet   Oral   Take 40 mg by mouth 2 (two) times daily.           Marland Kitchen glipiZIDE-metformin (METAGLIP) 2.5-500 MG per tablet   Oral   Take 2 tablets by mouth 2 (two) times daily before a meal.           . insulin NPH-insulin regular (NOVOLIN 70/30) (70-30) 100 UNIT/ML injection   Subcutaneous   Inject 60 Units into the skin 2 (two) times daily with a meal.          . lisinopril (PRINIVIL,ZESTRIL) 20 MG tablet   Oral   Take 20 mg by mouth daily.           . nitroGLYCERIN (NITROSTAT) 0.4 MG SL tablet   Sublingual   Place 0.4 mg under the tongue every 5 (five) minutes as needed.           . potassium chloride SA (K-DUR,KLOR-CON) 20 MEQ tablet   Oral   Take 20 mEq by mouth daily.           . prasugrel (EFFIENT) 10 MG TABS   Oral   Take 1 tablet (10 mg total) by mouth daily.   90 tablet   3   . traMADol (ULTRAM) 50 MG tablet   Oral   Take 50 mg by mouth every 6 (six) hours as needed.             BP 135/68  Temp(Src) 98.1 F (36.7 C) (Oral)  Resp 17  SpO2 95%  Physical Exam  Nursing note and vitals reviewed. Constitutional: He appears well-developed and well-nourished. No distress.  Morbidly obese  HENT:  Head: Normocephalic and atraumatic.  Mouth/Throat: Oropharynx is clear and moist.  Eyes:  EOM are normal. Pupils are equal, round, and reactive to light.  Neck: Normal range of  motion. Neck supple. No spinous process tenderness present.  Cardiovascular: Normal rate, regular rhythm and normal heart sounds.   Pulmonary/Chest: Effort normal and breath sounds normal. No respiratory distress. He has no wheezes. He has no rales. He exhibits tenderness.  Abdominal: Soft. Bowel sounds are normal. He exhibits no mass. There is no tenderness. There is no rebound and no guarding.  Musculoskeletal: Normal range of motion.       Right shoulder: He exhibits bony tenderness. He exhibits no swelling, no effusion, no deformity and normal pulse.  Pain with ROM of the right shoulder Normal ROM of the lower extremities  Neurological: He is alert. No cranial nerve deficit or sensory deficit.  Grip strength 5/5 bilaterally  Skin: Skin is warm and dry. He is not diaphoretic.  Psychiatric: He has a normal mood and affect.    ED Course  Procedures (including critical care time)  Labs Reviewed  CBC WITH DIFFERENTIAL  BASIC METABOLIC PANEL   Dg Chest 2 View  03/20/2012  *RADIOLOGY REPORT*  Clinical Data: Chest pain.  CHEST - 2 VIEW  Comparison: 07/14/2010  Findings: Bilateral lung volumes are low.  No evidence of edema, consolidation or pleural fluid.  The heart size is within normal limits.  Degenerative changes are present in the thoracic spine.  IMPRESSION: No active disease.   Original Report Authenticated By: Irish Lack, M.D.    Dg Shoulder Right  03/20/2012  *RADIOLOGY REPORT*  Clinical Data: Fall with right shoulder pain.  RIGHT SHOULDER - 2+ VIEW  Comparison: None.  Findings: No acute fracture or dislocation is identified.  There are substantial proliferative changes involving the acromion and AC joint.  No bony lesions or destruction.  No soft tissue abnormalities.  IMPRESSION: No acute fracture.  Significant AC joint degenerative changes.   Original Report Authenticated By: Irish Lack, M.D.      No diagnosis found.  Patient reports that the chest pain completely  resolved after given GI cocktail.  Patient discussed with Dr. Blinda Leatherwood who also evaluated the patient.  8:53 AM Discussed with Case Management who has agreed to see the patient in the ED to discuss Home Health options.   Date: 03/20/2012  Rate: 101  Rhythm: sinus tachycardia  QRS Axis: normal  Intervals: normal  ST/T Wave abnormalities: nonspecific t wave changes  Conduction Disutrbances:none  Narrative Interpretation:   Old EKG Reviewed: unchanged  Patient has been evaluated by Case Management who has facilitated follow up for the patient to be set up with PT, Home Health Aide, and a Home Health RN come into his home.  MDM  Patient presents today after falling out of bed.  He landed on his right shoulder when he fell.  No LOC.  He did not hit his head.  No HA, nausea, vomiting, or changes in vision.  Shoulder xray negative.  He was complaining of some mild chest pain, which he thought felt like GERD.  Pain completely resolved after GI cocktail.  No ischemic changes in EKG.  Troponin negative.  Patient found to have marked leukocytosis.  No source identified.  CXR and UA negative.  Patient is afebrile.  CBG elevated at 338.  However, patient missed his insulin dose this morning.  Discussed hyperglycemia with patient.  He states that he can just take his insulin at home.  Patient with normal bicarbonate.  Anion gap very mildly elevated at 13.  Patient is not having any symptoms of DKA.  Case management met with the patient to facilitate setting him up with a Home Health Aide, RN, and PT.  Patient discharged home.  Patient in agreement with the plan.  Return precautions discussed with the patient.          Pascal Lux Proctor, PA-C 03/20/12 [phone]

## 2012-03-21 DIAGNOSIS — I219 Acute myocardial infarction, unspecified: Secondary | ICD-10-CM

## 2012-03-21 DIAGNOSIS — I214 Non-ST elevation (NSTEMI) myocardial infarction: Secondary | ICD-10-CM

## 2012-03-21 LAB — CBC WITH DIFFERENTIAL/PLATELET
Basophil #: 0 10*3/uL (ref 0.0–0.1)
Basophil %: 0.4 %
Eosinophil %: 0.9 %
HCT: 42.6 % (ref 40.0–52.0)
HGB: 14.2 g/dL (ref 13.0–18.0)
Lymphocyte #: 1 10*3/uL (ref 1.0–3.6)
Lymphocyte %: 7.7 %
MCH: 27.3 pg (ref 26.0–34.0)
MCHC: 33.2 g/dL (ref 32.0–36.0)
Monocyte #: 1.1 x10 3/mm — ABNORMAL HIGH (ref 0.2–1.0)
Monocyte %: 8.3 %
Neutrophil #: 10.5 10*3/uL — ABNORMAL HIGH (ref 1.4–6.5)
RBC: 5.2 10*6/uL (ref 4.40–5.90)
RDW: 14.5 % (ref 11.5–14.5)
WBC: 12.7 10*3/uL — ABNORMAL HIGH (ref 3.8–10.6)

## 2012-03-21 LAB — BASIC METABOLIC PANEL
Anion Gap: 6 — ABNORMAL LOW (ref 7–16)
BUN: 7 mg/dL (ref 7–18)
Calcium, Total: 8.2 mg/dL — ABNORMAL LOW (ref 8.5–10.1)
Chloride: 104 mmol/L (ref 98–107)
Co2: 26 mmol/L (ref 21–32)
Creatinine: 0.6 mg/dL (ref 0.60–1.30)
Glucose: 161 mg/dL — ABNORMAL HIGH (ref 65–99)
Osmolality: 273 (ref 275–301)
Sodium: 136 mmol/L (ref 136–145)

## 2012-03-21 LAB — LIPID PANEL
Cholesterol: 118 mg/dL (ref 0–200)
Ldl Cholesterol, Calc: 64 mg/dL (ref 0–100)
Triglycerides: 129 mg/dL (ref 0–200)

## 2012-03-21 LAB — CK TOTAL AND CKMB (NOT AT ARMC): CK-MB: 4.8 ng/mL — ABNORMAL HIGH (ref 0.5–3.6)

## 2012-03-21 NOTE — Progress Notes (Signed)
   CARE MANAGEMENT NOTE 03/21/2012  Patient:  [name], [name]   Account Number:  [id number]  Date Initiated:  03/20/2012  Documentation initiated by:  [facility]  Subjective/Objective Assessment:   fall, weak     Action/Plan:   lives at home alone, [facility] needs   Anticipated DC Date:  03/20/2012   Anticipated DC Plan:  HOME W HOME HEALTH SERVICES      DC Planning Services  CM consult      [facility] Choice  HOME HEALTH   Choice offered to / List presented to:  C-1 Patient        HH arranged  HH-1 RN  HH-2 PT  HH-4 NURSE'S AIDE      HH agency  [facility]   Status of service:  Completed, signed off Medicare Important Message given?   (If response is "NO", the following Medicare IM given date fields will be blank) Date Medicare IM given:   Date Additional Medicare IM given:    Discharge Disposition:  HOME W HOME HEALTH SERVICES  Per UR Regulation:    If discussed at Long Length of Stay Meetings, dates discussed:    Comments:  03/21/2012 1545 NCM attempted call to pt and work number. No alternate numbers listed. Wife listed as emergency contact but pt stated on 2/23 she was in SNF. Unable to call her for update. NCM contacted [facility] and pt was set up with their services in 03/2010, but refused HH. They will follow up on referral to see if he wants HH post dc from ED. Searched other contacts, Pt did mention son and dtr but no contact information in records. Pt stated at time of consult on 2/23 his son works and does not assist him at home. And dtr lives in another state. [facility] will follow up on referral for [facility]. Isidoro Donning RN CCM Case Mgmt phone [phone]  03/21/2012 1000 NCM attempted call to home and work number. Left vm for pt to return call. Isidoro Donning RN CCM Case Mgmt phone [phone]  03/20/2012 4:40 PM  Attempted call to home to arrange [facility] and left message on vm for pt to return call. Isidoro Donning RN CCM Case Mgmt phone [phone]    03/20/2012 1:50 PM  NCM  spoke to pt and states he really needs someone to clean his home. Provided pt with list for [facility]. States he will review and let NCM which agency he wants to use. Will arrange [facility] PT, RN and aide for home. States his wife is in a SNF and he thinks he will eventually need SNF. He states he has difficulty with caring for himself at home. Isidoro Donning RN CCM Case Mgmt phone [phone]

## 2012-03-22 LAB — CBC WITH DIFFERENTIAL/PLATELET
Basophil #: 0 10*3/uL (ref 0.0–0.1)
Eosinophil #: 0.3 10*3/uL (ref 0.0–0.7)
Eosinophil %: 3.4 %
HGB: 13.7 g/dL (ref 13.0–18.0)
MCH: 27.3 pg (ref 26.0–34.0)
MCHC: 33.4 g/dL (ref 32.0–36.0)
MCV: 82 fL (ref 80–100)
Monocyte #: 1.3 x10 3/mm — ABNORMAL HIGH (ref 0.2–1.0)
Monocyte %: 12.8 %
Neutrophil %: 71.4 %
Platelet: 216 10*3/uL (ref 150–440)
RBC: 5.03 10*6/uL (ref 4.40–5.90)
RDW: 14.5 % (ref 11.5–14.5)
WBC: 10.2 10*3/uL (ref 3.8–10.6)

## 2012-03-22 LAB — BASIC METABOLIC PANEL
Anion Gap: 7 (ref 7–16)
Calcium, Total: 8 mg/dL — ABNORMAL LOW (ref 8.5–10.1)
Chloride: 104 mmol/L (ref 98–107)
Creatinine: 0.79 mg/dL (ref 0.60–1.30)
EGFR (Non-African Amer.): >60
Glucose: 152 mg/dL — ABNORMAL HIGH (ref 65–99)
Sodium: 138 mmol/L (ref 136–145)

## 2012-03-22 LAB — URINE CULTURE

## 2012-03-23 NOTE — ED Provider Notes (Signed)
Medical screening examination/treatment/procedure(s) were conducted as a shared visit with non-physician practitioner(s) and myself.  I personally evaluated the patient during the encounter.  Patient seen for minor fall while trying to get out of bed. Patient indicates that he slid will try to get out of bed, landed in seated position. He did not complain of head injury. Workup was negative. Patient indicates that he is having difficulty caring for himself. Case manager/social work consult performed the patient will have home health evaluation in morning.  Gilda Crease, MD 03/23/12 [phone]

## 2012-03-25 ENCOUNTER — Encounter: Payer: Self-pay | Admitting: *Deleted

## 2012-03-25 LAB — CULTURE, BLOOD (SINGLE)

## 2012-03-31 ENCOUNTER — Encounter: Payer: Medicare Other | Admitting: Cardiovascular Disease

## 2012-04-11 ENCOUNTER — Encounter: Payer: Self-pay | Admitting: Cardiovascular Disease

## 2012-04-11 ENCOUNTER — Ambulatory Visit (INDEPENDENT_AMBULATORY_CARE_PROVIDER_SITE_OTHER): Payer: Medicare Other | Admitting: Cardiovascular Disease

## 2012-04-11 VITALS — BP 145/84 | HR 75 | Ht 72.0 in | Wt 369.5 lb

## 2012-04-11 DIAGNOSIS — I2510 Atherosclerotic heart disease of native coronary artery without angina pectoris: Secondary | ICD-10-CM

## 2012-04-11 DIAGNOSIS — E785 Hyperlipidemia, unspecified: Secondary | ICD-10-CM

## 2012-04-11 DIAGNOSIS — I10 Essential (primary) hypertension: Secondary | ICD-10-CM

## 2012-04-11 NOTE — Assessment & Plan Note (Addendum)
Pt is doing well.  No further CP.  He declined cath.  We had a long talk about the fact that he could have recurrent CP an MI.  He understands the risks.  We discussed the fact that there was really no indication for stress testing. He already has had a non-ST segment elevation myocardial infarction I think that all of [place]  would  agree that cardiac catheterization is indicated.  He's not inclined have a cardiac catheterization at this point.  His EKG shows poor R-wave progression with possible old anterior wall myocardial infarction.. His echocardiogram reveals normal left ventricle systolic function with an ejection fraction  Of  55%.  His normal echo suggest that he has not had a major wall MI. The echo was technically difficult because of his size.  He seems depressed.  He has lost interest  In most things that he used to enjoy.   I incouraged him to think about seeing a therapist.    He will follow up with his medical doctor.  We will see him on as needed basis .

## 2012-04-11 NOTE — Patient Instructions (Addendum)
Follow up with Dr. Elease Hashimoto as needed.  Continue with your current medications.

## 2012-04-11 NOTE — Progress Notes (Signed)
[name] Date of Birth  15-Dec-1945       [facility]    [place] [address], [address]  [address] [place], [place]  [address]   [place], [place]  [address] [phone]     [phone]   Fax  [phone]    Fax [phone]  Problem List: 1. Coronary artery disease-  4.0 x 12 mm Promus stent to the LM  presented to [facility] in February, 2014  for NSTEMI, declined to have cath- saw Dr. Mariah Milling  2. Morbid obesity 3. Hypertension 4. Hyperlipidemia 5. Type 2 diabetes mellitus   History of Present Illness:  [name] is a 67 yo with CAD - hx of LM tenting in 6/12.  He presented to the hospital with syncope and collapse and was found to have + Troponin levels.  He declined cardiac cath.   no further episodes of CP.  No syncope.  He is a retired Nurse, children's.    He has lost ~200 lbs over the past several years.    Current Outpatient Prescriptions on File Prior to Visit  Medication Sig Dispense Refill  . acetaminophen (TYLENOL) 500 MG tablet Take 1,500 mg by mouth every 12 (twelve) hours as needed for pain (knee and chest pain).      Marland Kitchen aspirin 325 MG tablet Take 325 mg by mouth daily.      . furosemide (LASIX) 40 MG tablet Take 40 mg by mouth daily.      Marland Kitchen gemfibrozil (LOPID) 600 MG tablet Take 600 mg by mouth 2 (two) times daily.      Marland Kitchen glyBURIDE (DIABETA) 5 MG tablet Take 5 mg by mouth 2 (two) times daily with a meal.      . nitroGLYCERIN (NITROSTAT) 0.4 MG SL tablet Place 0.4 mg under the tongue every 5 (five) minutes as needed for chest pain.      Marland Kitchen nystatin (MYCOSTATIN/NYSTOP) 100000 UNIT/GM POWD Apply topically 3 (three) times daily.       No current facility-administered medications on file prior to visit.    No Known Allergies  Past Medical History  Diagnosis Date  . Coronary artery disease     a. s/p Lat STEMI 07/13/10: tx with Promus DES to distal LM; s/p POBA to distal LAD;  b. cath 07/13/10: dLM thrombotic 90-95%, LAD  scattered 50-60%, dLAD occluded, AVCFX 50-70%, pRCA 50-60%, mRCA 50%, PDA 50%;    c. echo 07/14/10: EF 55%, mild LVH, mild LAE, grade 2 diast dysfxn, Ao root 38 mm  . Hypertension   . Hyperlipidemia   . Morbid obesity   . NSVT (nonsustained ventricular tachycardia)     post MI; normal LVF   . Physical deconditioning     requiring outpatient PT  . Osteoarthritis   . Morbid obesity   . ASCVD (arteriosclerotic cardiovascular disease)   . MI (myocardial infarction)   . S/P PTCA (percutaneous transluminal coronary angioplasty)   . Lower extremity cellulitis   . Peripheral edema   . Diabetes mellitus      Insulin-dependent  . Chronic knee pain   . Anemia, unspecified     Past Surgical History  Procedure Laterality Date  . Cardiac catheterization    . Coronary stent placement      drug-eluting to the distal left main and PTCA of the distal left anterior descending.  . Skin graft Right     history  . Knee surgery Left   . Cataract surgery  History  Smoking status  . Never Smoker   Smokeless tobacco  . Not on file    History  Alcohol Use No    Family History  Problem Relation Age of Onset  . Heart attack Brother 60    died  . Coronary artery disease      strongly positive family hx of  . Heart attack Father     Reviw of Systems:  Reviewed in the HPI.  All other systems are negative.  Physical Exam: Blood pressure 145/84, pulse 75, height 6' (1.829 m), weight 369 lb 8 oz (167.604 kg). General: Well developed, well nourished, in no acute distress.  He is morbidly obese  Head: Normocephalic, atraumatic, sclera non-icteric, mucus membranes are moist,   Neck: Supple. Carotids are 2 + without bruits. No JVD   Lungs: Clear   Heart: RR, Normal S1, S2  Abdomen: Soft, non-tender, obese  Msk:  Strength and tone are normal   Extremities: chronic stasis changes, 2-3+ edema - chronic - not worsened per patient   Neuro: CN II - XII intact.  Alert and oriented X 3.    Psych:  Normal   ECG: 04/11/2012: Normal sinus rhythm at 75. He has low voltage. Is poor R wave progression which could be due to an old anterior wall myocardial infarction versus obesity.  Assessment / Plan:

## 2012-04-26 DIAGNOSIS — I214 Non-ST elevation (NSTEMI) myocardial infarction: Secondary | ICD-10-CM

## 2012-04-26 HISTORY — DX: Non-ST elevation (NSTEMI) myocardial infarction: I21.4

## 2012-05-07 ENCOUNTER — Inpatient Hospital Stay: Payer: Self-pay | Admitting: Internal Medicine

## 2012-05-07 LAB — BASIC METABOLIC PANEL
BUN: 12 mg/dL (ref 7–18)
Chloride: 107 mmol/L (ref 98–107)
Co2: 25 mmol/L (ref 21–32)
Creatinine: 0.79 mg/dL (ref 0.60–1.30)
EGFR (African American): >60
EGFR (Non-African Amer.): >60
Glucose: 342 mg/dL — ABNORMAL HIGH (ref 65–99)
Osmolality: 289 (ref 275–301)

## 2012-05-07 LAB — CK TOTAL AND CKMB (NOT AT ARMC)
CK, Total: 57 U/L (ref 35–232)
CK, Total: 257 U/L — ABNORMAL HIGH (ref 35–232)
CK-MB: 1.3 ng/mL (ref 0.5–3.6)
CK-MB: 35.3 ng/mL — ABNORMAL HIGH (ref 0.5–3.6)

## 2012-05-07 LAB — TROPONIN I
Troponin-I: 0.04 ng/mL
Troponin-I: 4.9 ng/mL — ABNORMAL HIGH

## 2012-05-07 LAB — CBC
HGB: 15.4 g/dL (ref 13.0–18.0)
Platelet: 270 10*3/uL (ref 150–440)
RDW: 13.8 % (ref 11.5–14.5)
WBC: 10 10*3/uL (ref 3.8–10.6)

## 2012-05-08 DIAGNOSIS — I219 Acute myocardial infarction, unspecified: Secondary | ICD-10-CM

## 2012-05-08 LAB — LIPID PANEL
Cholesterol: 164 mg/dL (ref 0–200)
Ldl Cholesterol, Calc: 107 mg/dL — ABNORMAL HIGH (ref 0–100)
Triglycerides: 141 mg/dL (ref 0–200)

## 2012-05-08 LAB — CK TOTAL AND CKMB (NOT AT ARMC)
CK, Total: 348 U/L — ABNORMAL HIGH (ref 35–232)
CK-MB: 53.9 ng/mL — ABNORMAL HIGH (ref 0.5–3.6)

## 2012-05-08 LAB — HEMOGLOBIN A1C: Hemoglobin A1C: 9.7 % — ABNORMAL HIGH (ref 4.2–6.3)

## 2012-05-08 LAB — CBC WITH DIFFERENTIAL/PLATELET
Basophil %: 0.7 %
HCT: 44.8 % (ref 40.0–52.0)
Lymphocyte #: 1.3 10*3/uL (ref 1.0–3.6)
Lymphocyte %: 14.2 %
MCV: 81 fL (ref 80–100)
Neutrophil #: 6.6 10*3/uL — ABNORMAL HIGH (ref 1.4–6.5)
Neutrophil %: 70.4 %
RBC: 5.51 10*6/uL (ref 4.40–5.90)
WBC: 9.4 10*3/uL (ref 3.8–10.6)

## 2012-05-08 LAB — APTT
Activated PTT: 38.2 secs — ABNORMAL HIGH (ref 23.6–35.9)
Activated PTT: 93.1 secs — ABNORMAL HIGH (ref 23.6–35.9)

## 2012-05-09 ENCOUNTER — Encounter (HOSPITAL_COMMUNITY)
Admission: EM | Disposition: A | Discharge status: Home / Self Care | Payer: Self-pay | Source: Other Acute Inpatient Hospital | Attending: Cardiovascular Disease

## 2012-05-09 ENCOUNTER — Ambulatory Visit (HOSPITAL_COMMUNITY): Admit: 2012-05-09 | Payer: Self-pay | Admitting: Cardiovascular Disease

## 2012-05-09 ENCOUNTER — Encounter (HOSPITAL_COMMUNITY): Payer: Self-pay | Admitting: *Deleted

## 2012-05-09 ENCOUNTER — Inpatient Hospital Stay (HOSPITAL_COMMUNITY)
Admission: EM | Admit: 2012-05-09 | Discharge: 2012-05-10 | DRG: 247 | Disposition: A | Discharge status: Home / Self Care | Payer: Medicare Other | Source: Other Acute Inpatient Hospital | Attending: Cardiovascular Disease | Admitting: Cardiovascular Disease

## 2012-05-09 DIAGNOSIS — I214 Non-ST elevation (NSTEMI) myocardial infarction: Principal | ICD-10-CM

## 2012-05-09 DIAGNOSIS — I2510 Atherosclerotic heart disease of native coronary artery without angina pectoris: Secondary | ICD-10-CM | POA: Diagnosis present

## 2012-05-09 DIAGNOSIS — Z6841 Body Mass Index (BMI) 40.0 and over, adult: Secondary | ICD-10-CM

## 2012-05-09 DIAGNOSIS — E785 Hyperlipidemia, unspecified: Secondary | ICD-10-CM | POA: Diagnosis present

## 2012-05-09 DIAGNOSIS — I252 Old myocardial infarction: Secondary | ICD-10-CM

## 2012-05-09 DIAGNOSIS — I2589 Other forms of chronic ischemic heart disease: Secondary | ICD-10-CM | POA: Diagnosis present

## 2012-05-09 DIAGNOSIS — Z9861 Coronary angioplasty status: Secondary | ICD-10-CM

## 2012-05-09 DIAGNOSIS — I472 Ventricular tachycardia, unspecified: Secondary | ICD-10-CM | POA: Diagnosis present

## 2012-05-09 DIAGNOSIS — I255 Ischemic cardiomyopathy: Secondary | ICD-10-CM

## 2012-05-09 DIAGNOSIS — Z955 Presence of coronary angioplasty implant and graft: Secondary | ICD-10-CM

## 2012-05-09 DIAGNOSIS — IMO0001 Reserved for inherently not codable concepts without codable children: Secondary | ICD-10-CM

## 2012-05-09 DIAGNOSIS — E119 Type 2 diabetes mellitus without complications: Secondary | ICD-10-CM | POA: Diagnosis present

## 2012-05-09 DIAGNOSIS — Z91199 Patient's noncompliance with other medical treatment and regimen due to unspecified reason: Secondary | ICD-10-CM

## 2012-05-09 DIAGNOSIS — I10 Essential (primary) hypertension: Secondary | ICD-10-CM | POA: Diagnosis present

## 2012-05-09 DIAGNOSIS — I4729 Other ventricular tachycardia: Secondary | ICD-10-CM | POA: Diagnosis present

## 2012-05-09 DIAGNOSIS — Z7902 Long term (current) use of antithrombotics/antiplatelets: Secondary | ICD-10-CM

## 2012-05-09 DIAGNOSIS — Z9119 Patient's noncompliance with other medical treatment and regimen: Secondary | ICD-10-CM

## 2012-05-09 DIAGNOSIS — Z8249 Family history of ischemic heart disease and other diseases of the circulatory system: Secondary | ICD-10-CM

## 2012-05-09 DIAGNOSIS — M1990 Unspecified osteoarthritis, unspecified site: Secondary | ICD-10-CM | POA: Diagnosis present

## 2012-05-09 HISTORY — PX: LEFT HEART CATHETERIZATION WITH CORONARY ANGIOGRAM: SHX5451

## 2012-05-09 HISTORY — DX: Ischemic cardiomyopathy: I25.5

## 2012-05-09 LAB — CBC
MCH: 27 pg (ref 26.0–34.0)
MCHC: 34.1 g/dL (ref 30.0–36.0)
Platelets: 198 10*3/uL (ref 150–400)
RDW: 13.6 % (ref 11.5–15.5)

## 2012-05-09 LAB — CBC WITH DIFFERENTIAL/PLATELET
Basophil %: 1 %
Eosinophil %: 5.5 %
Lymphocyte #: 1.6 10*3/uL (ref 1.0–3.6)
MCHC: 32.9 g/dL (ref 32.0–36.0)
MCV: 81 fL (ref 80–100)
Monocyte #: 0.7 x10 3/mm (ref 0.2–1.0)
Monocyte %: 9.1 %
Neutrophil #: 5.3 10*3/uL (ref 1.4–6.5)
Neutrophil %: 64.9 %
Platelet: 222 10*3/uL (ref 150–440)
RDW: 14.2 % (ref 11.5–14.5)
WBC: 8.1 10*3/uL (ref 3.8–10.6)

## 2012-05-09 LAB — CREATININE, SERUM
Creatinine, Ser: 0.76 mg/dL (ref 0.50–1.35)
GFR calc non Af Amer: >90 mL/min (ref >90)

## 2012-05-09 LAB — BASIC METABOLIC PANEL
Anion Gap: 5 — ABNORMAL LOW (ref 7–16)
Calcium, Total: 8.4 mg/dL — ABNORMAL LOW (ref 8.5–10.1)
Chloride: 106 mmol/L (ref 98–107)
Co2: 27 mmol/L (ref 21–32)
Creatinine: 0.64 mg/dL (ref 0.60–1.30)
EGFR (African American): >60
EGFR (Non-African Amer.): >60
Glucose: 174 mg/dL — ABNORMAL HIGH (ref 65–99)
Osmolality: 278 (ref 275–301)
Sodium: 138 mmol/L (ref 136–145)

## 2012-05-09 LAB — APTT: Activated PTT: 146.5 secs — ABNORMAL HIGH (ref 23.6–35.9)

## 2012-05-09 LAB — GLUCOSE, CAPILLARY: Glucose-Capillary: 165 mg/dL — ABNORMAL HIGH (ref 70–99)

## 2012-05-09 SURGERY — LEFT HEART CATHETERIZATION WITH CORONARY ANGIOGRAM
Anesthesia: LOCAL

## 2012-05-09 MED ORDER — LIDOCAINE HCL (PF) 1 % IJ SOLN
INTRAMUSCULAR | Status: AC
  Filled 2012-05-09: qty 30

## 2012-05-09 MED ORDER — NITROGLYCERIN 1 MG/10 ML FOR IR/CATH LAB
INTRA_ARTERIAL | Status: AC
  Filled 2012-05-09: qty 10

## 2012-05-09 MED ORDER — HEPARIN (PORCINE) IN NACL 2-0.9 UNIT/ML-% IJ SOLN
INTRAMUSCULAR | Status: AC
  Filled 2012-05-09: qty 1000

## 2012-05-09 MED ORDER — ASPIRIN 81 MG PO CHEW
81.0000 mg | CHEWABLE_TABLET | Freq: Every day | ORAL | Status: DC
  Administered 2012-05-10: 81 mg via ORAL
  Filled 2012-05-09: qty 1

## 2012-05-09 MED ORDER — MIDAZOLAM HCL 2 MG/2ML IJ SOLN
INTRAMUSCULAR | Status: AC
  Filled 2012-05-09: qty 2

## 2012-05-09 MED ORDER — INSULIN NPH (HUMAN) (ISOPHANE) 100 UNIT/ML ~~LOC~~ SUSP
50.0000 [IU] | Freq: Two times a day (BID) | SUBCUTANEOUS | Status: DC
  Administered 2012-05-09 – 2012-05-10 (×3): 50 [IU] via SUBCUTANEOUS
  Filled 2012-05-09: qty 10

## 2012-05-09 MED ORDER — ASPIRIN 81 MG PO CHEW
CHEWABLE_TABLET | ORAL | Status: AC
  Filled 2012-05-09: qty 4

## 2012-05-09 MED ORDER — SODIUM CHLORIDE 0.9 % IV SOLN
INTRAVENOUS | Status: AC
  Administered 2012-05-09: 20:00:00 via INTRAVENOUS

## 2012-05-09 MED ORDER — GLYBURIDE 5 MG PO TABS
5.0000 mg | ORAL_TABLET | Freq: Two times a day (BID) | ORAL | Status: DC
  Administered 2012-05-09 – 2012-05-10 (×2): 5 mg via ORAL
  Filled 2012-05-09 (×4): qty 1

## 2012-05-09 MED ORDER — ATORVASTATIN CALCIUM 20 MG PO TABS
20.0000 mg | ORAL_TABLET | Freq: Every day | ORAL | Status: DC
  Administered 2012-05-09: 20 mg via ORAL
  Filled 2012-05-09 (×2): qty 1

## 2012-05-09 MED ORDER — OXYCODONE-ACETAMINOPHEN 5-325 MG PO TABS
1.0000 | ORAL_TABLET | ORAL | Status: DC | PRN
  Administered 2012-05-10: 2 via ORAL
  Filled 2012-05-09: qty 2

## 2012-05-09 MED ORDER — ENOXAPARIN SODIUM 40 MG/0.4ML ~~LOC~~ SOLN
40.0000 mg | SUBCUTANEOUS | Status: DC
  Administered 2012-05-10: 40 mg via SUBCUTANEOUS
  Filled 2012-05-09 (×2): qty 0.4

## 2012-05-09 MED ORDER — ONDANSETRON HCL 4 MG/2ML IJ SOLN: 4.0000 mg | Freq: Four times a day (QID) | INTRAMUSCULAR | Status: DC | PRN

## 2012-05-09 MED ORDER — PRASUGREL HCL 10 MG PO TABS
ORAL_TABLET | ORAL | Status: AC
Start: 2012-05-09 — End: 2012-05-09
  Filled 2012-05-09: qty 1

## 2012-05-09 MED ORDER — ACETAMINOPHEN 325 MG PO TABS: 650.0000 mg | ORAL_TABLET | ORAL | Status: DC | PRN

## 2012-05-09 MED ORDER — NITROGLYCERIN 0.4 MG SL SUBL: 0.4000 mg | SUBLINGUAL_TABLET | SUBLINGUAL | Status: DC | PRN

## 2012-05-09 MED ORDER — PRASUGREL HCL 10 MG PO TABS
10.0000 mg | ORAL_TABLET | Freq: Every day | ORAL | Status: DC
  Administered 2012-05-09 – 2012-05-10 (×2): 10 mg via ORAL
  Filled 2012-05-09 (×2): qty 1

## 2012-05-09 MED ORDER — FENTANYL CITRATE 0.05 MG/ML IJ SOLN
INTRAMUSCULAR | Status: AC
  Filled 2012-05-09: qty 2

## 2012-05-09 MED ORDER — GEMFIBROZIL 600 MG PO TABS
600.0000 mg | ORAL_TABLET | Freq: Two times a day (BID) | ORAL | Status: DC
  Administered 2012-05-09 (×2): 600 mg via ORAL
  Filled 2012-05-09 (×4): qty 1

## 2012-05-09 MED ORDER — FUROSEMIDE 40 MG PO TABS
40.0000 mg | ORAL_TABLET | Freq: Every day | ORAL | Status: DC
  Administered 2012-05-09 – 2012-05-10 (×2): 40 mg via ORAL
  Filled 2012-05-09 (×2): qty 1

## 2012-05-09 MED ORDER — NYSTATIN 100000 UNIT/GM EX POWD
Freq: Three times a day (TID) | CUTANEOUS | Status: DC
  Administered 2012-05-09 – 2012-05-10 (×3): via TOPICAL
  Filled 2012-05-09: qty 15

## 2012-05-09 NOTE — CV Procedure (Signed)
Cardiac Catheterization Procedure Note  Name: [name] MRN: [id number] DOB: Jun 09, 1945  Procedure: Left Heart Cath, Selective Coronary Angiography, LV angiography, PTCA and drug-eluting stenting x2 of the mid and proximal right coronary artery.  Indication: Non-ST elevation myocardial infarction  Medications:  Sedation:  2 mg IV Versed, 25 mcg IV Fentanyl  Contrast:  205 ML Omnipaque  Procedural Details: The right wrist was prepped, draped, and anesthetized with 1% lidocaine. Using the modified Seldinger technique, a 5 French sheath was introduced into the right radial artery. 3 mg of verapamil was administered through the sheath, weight-based unfractionated heparin was administered intravenously. A JACKIE catheter was used for selective coronary angiography. A JL 3.5 was used to engage the left main coronary artery. A pigtail catheter was used for left ventriculography. Catheter exchanges were performed over an exchange length guidewire. There were no immediate procedural complications.  Procedural Findings:  Hemodynamics: AO:  115/60   mmHg LV:  117/10    mmHg LVEDP: 14  mmHg  Coronary angiography: Coronary dominance: Right   Left Main:  Normal in size. The stent is noted in the mid and distal segment which is widely patent with no evidence of restenosis. There is 20% stenosis either at the distal segment of the stent or just distal to the stent.  Left Anterior Descending (LAD):  Normal in size and moderately calcified throughout its course. There is 20% diffuse disease proximally. There is diffuse 40% disease throughout the midsegment. The vessel is occluded distally with faint left to left collaterals supplying the apex.  1st diagonal (D1):  Normal in size with 60-70% ostial stenosis.  2nd diagonal (D2):  Normal in size with minor irregularities.  3rd diagonal (D3):  Normal in size with no significant disease.  Circumflex (LCx):  Normal in size and nondominant. There is  extensive percent discrete stenosis in the midsegment.  1st obtuse marginal:  Medium in size with minor irregularities.  2nd obtuse marginal:  Medium in size with minor irregularities.  3rd obtuse marginal:  Normal in size with no significant disease.   Right Coronary Artery: Very large in size and dominant. There is a 60% ostial stenosis. This is followed in the proximal segment by a 90% stenosis. In the midsegment, there is 99% hazy stenosis with a filling defect suggestive of a thrombus. Distal to this there is TIMI 1 flow in the vessel. The RCA gets faint left-to-right collaterals.   Left ventriculography: Left ventricular systolic function is mildly reduced , LVEF is estimated at 40 %, there is no significant mitral regurgitation . Moderate inferior wall hypokinesis.  PCI Note:  Following the diagnostic procedure, the decision was made to proceed with PCI. The radial sheath was upsized to a 6 [place]. The patient hasn't received loading dose of Effient yesterday. Due to his morbid obesity and difficulty in dosing bivalirudin , I elected to use heparin. He was initially given 6000 units at the beginning of the diagnostic case. His ACT was 210. He was given an additional 4000 units of heparin with an ACT of 310. Once a therapeutic ACT was achieved, a 6 [place] JR 4 guide catheter was inserted.  A intuition coronary guidewire was used to cross the lesion.  Aspiration thrombectomy was performed with a Pronto catheter with 1 pass. This did not pass the lesion in the midsegment due to severity of stenosis. The lesion was predilated with a 2.5 x 15 mm balloon.  It was obvious that the RCA had disease all the way  from the midsegment to the ostium. I then placed a 3.5 x 38 mm Promus stent in the mid RCA. There was some difficulty in advancing the stent. I then overlap this with a 4.0 x 24 mm Promus stent which went all the way back to cover the ostial lesion.  The stent was postdilated with a 4.0 x 20 mm  noncompliant balloon. There was some difficulty in advancing the balloon in the midsegment and thus I used a prowater coronary guidewire as a buddy wire. There was some haziness in the mid segment and I went back with the same balloon and performed a prolonged inflation Following PCI, there was 0% residual stenosis and TIMI-3 flow. Final angiography confirmed an excellent result. The patient tolerated the procedure well. There were no immediate procedural complications. A TR band was used for radial hemostasis. The patient was transferred to the post catheterization recovery area for further monitoring.  PCI Data: Vessel - mid RCA/Segment - 2 Percent Stenosis (pre)  99% TIMI-flow 1 Stent 3.5 x 38 mm Promus drug-eluting stent postdilated with a 4.0 noncompliant balloon Percent Stenosis (post) 0% TIMI-flow (post) 3  Vessel - proximal RCA/Segment - 1 Percent Stenosis (pre)  90% TIMI-flow 3 Stent 4.0 x 24 mm Promus drug-eluting stent Percent Stenosis (post) 0% TIMI-flow (post) 3  Final Conclusions:  1. Patent left main stent with mild restenosis distally. The LAD is known to be occluded distally close to the apex with some left to left collaterals. 2. Severe right coronary artery thrombotic stenosis which is likely the culprit for non-ST elevation myocardial infarction. 3. Successful thrombectomy, balloon angioplasty and 2 overlapped drug-eluting stents to cover the mid and proximal RCA.  Recommendations:  Recommend dual antiplatelet therapy for at least one year. Aggressive treatment of risk factors is recommended. The patient will need social work consult to help him with his medications.  Lorine Bears MD, [facility] 05/09/2012, 1:13 PM

## 2012-05-09 NOTE — H&P (Signed)
History and Physical  Patient ID: [name] MRN: [id number] DOB/AGE: 27-Aug-1945 67 y.o. Admit date: 05/09/2012   Primary Cardiologist Dr. Denyse Amass. Nahser  HPI: This is a 67 year old morbidly obese male with known history of coronary artery disease status post anterior MI June of 2012. He was found to have an occluded distal LAD as well as significant distal left main plaque rupture. He underwent drug-eluting stent placement to the left main and balloon angioplasty to the distal LAD. He presented to [facility] with chest pain and was found to have non-ST elevation myocardial infarction with a troponin of 13. He had 2 runs of asymptomatic nonsustained ventricular tachycardia. I reviewed cardiology consult and today's progress note. This is just a summary.  Review of systems complete and found to be negative unless listed above  Past Medical History  Diagnosis Date  . Coronary artery disease     a. s/p Lat STEMI 07/13/10: tx with Promus DES to distal LM; s/p POBA to distal LAD;  b. cath 07/13/10: dLM thrombotic 90-95%, LAD scattered 50-60%, dLAD occluded, AVCFX 50-70%, pRCA 50-60%, mRCA 50%, PDA 50%;    c. echo 07/14/10: EF 55%, mild LVH, mild LAE, grade 2 diast dysfxn, Ao root 38 mm  . Hypertension   . Hyperlipidemia   . Morbid obesity   . NSVT (nonsustained ventricular tachycardia)     post MI; normal LVF   . Physical deconditioning     requiring outpatient PT  . Osteoarthritis   . Morbid obesity   . ASCVD (arteriosclerotic cardiovascular disease)   . MI (myocardial infarction)   . S/P PTCA (percutaneous transluminal coronary angioplasty)   . Lower extremity cellulitis   . Peripheral edema   . Diabetes mellitus      Insulin-dependent  . Chronic knee pain   . Anemia, unspecified     Family History  Problem Relation Age of Onset  . Heart attack Brother 60    died  . Coronary artery disease      strongly positive family hx of  . Heart attack Father     History   Social History   . Marital Status: Married    Spouse Name: [name]    Number of Children: 2  . Years of Education: [name]   Occupational History  . Not on file.   Social History Main Topics  . Smoking status: Never Smoker   . Smokeless tobacco: Not on file  . Alcohol Use: No  . Drug Use: No  . Sexually Active: Not on file   Other Topics Concern  . Not on file   Social History Narrative  . No narrative on file    Past Surgical History  Procedure Laterality Date  . Cardiac catheterization    . Coronary stent placement      drug-eluting to the distal left main and PTCA of the distal left anterior descending.  . Skin graft Right     history  . Knee surgery Left   . Cataract surgery       Prescriptions prior to admission  Medication Sig Dispense Refill  . acetaminophen (TYLENOL) 500 MG tablet Take 1,500 mg by mouth every 12 (twelve) hours as needed for pain (knee and chest pain).      Marland Kitchen aspirin 325 MG tablet Take 325 mg by mouth daily.      . furosemide (LASIX) 40 MG tablet Take 40 mg by mouth daily.      Marland Kitchen gemfibrozil (LOPID) 600 MG tablet Take  600 mg by mouth 2 (two) times daily.      Marland Kitchen glyBURIDE (DIABETA) 5 MG tablet Take 5 mg by mouth 2 (two) times daily with a meal.      . insulin NPH (HUMULIN N) 100 UNIT/ML injection Inject 50 Units into the skin 2 (two) times daily.      . nitroGLYCERIN (NITROSTAT) 0.4 MG SL tablet Place 0.4 mg under the tongue every 5 (five) minutes as needed for chest pain.      Marland Kitchen nystatin (MYCOSTATIN/NYSTOP) 100000 UNIT/GM POWD Apply topically 3 (three) times daily.      . prasugrel (EFFIENT) 10 MG TABS Take 10 mg by mouth daily.        Physical Exam: There were no vitals taken for this visit.   Labs:   Lab Results  Component Value Date   WBC 26.5* 03/20/2012   HGB 16.4 03/20/2012   HCT 47.2 03/20/2012   MCV 81.1 03/20/2012   PLT 233 03/20/2012   No results found for this basename: NA, K, CL, CO2, BUN, CREATININE, CALCIUM, LABALBU, PROT, BILITOT, ALKPHOS, ALT, AST,  GLUCOSE,  in the last 168 hours Lab Results  Component Value Date   CKTOTAL 135 07/15/2010   CKMB 9.5* 07/15/2010   TROPONINI 3.94* 07/15/2010    Lab Results  Component Value Date   CHOL 156 07/15/2010   Lab Results  Component Value Date   HDL 34* 07/15/2010   Lab Results  Component Value Date   LDLCALC 84 07/15/2010   Lab Results  Component Value Date   TRIG 188* 07/15/2010   Lab Results  Component Value Date   CHOLHDL 4.6 07/15/2010   No results found for this basename: LDLDIRECT      ASSESSMENT AND PLAN:  1. Non-ST elevation myocardial infarction: Recommend cardiac catheterization today via the radial artery if possible given his morbid obesity. He has already been loaded with Effient. Compliance with medications has been an issue. This is due to financial reasons according to the patient. The patient has lost about 100 pound since 2012. 2. Nonsustained ventricular tachycardia: Likely due to myocardial infarction. Check ejection fraction and continue treatment with a beta blocker. He has been started on amiodarone.   Signed: Lorine Bears MD, [facility] 05/09/2012, 11:33 AM

## 2012-05-09 NOTE — Care Management Note (Addendum)
    Page 1 of 1   05/10/2012     11:44:08 AM   CARE MANAGEMENT NOTE 05/10/2012  Patient:  [name], [name]   Account Number:  [id number]  Date Initiated:  05/09/2012  Documentation initiated by:  Junius Creamer  Subjective/Objective Assessment:   adm w mi     Action/Plan:   lives w wife   Anticipated DC Date:  05/10/2012   Anticipated DC Plan:  HOME/SELF CARE      DC Planning Services  CM consult  Medication Assistance      Choice offered to / List presented to:             Status of service:   Medicare Important Message given?   (If response is "NO", the following Medicare IM given date fields will be blank) Date Medicare IM given:   Date Additional Medicare IM given:    Discharge Disposition:  HOME/SELF CARE  Per UR Regulation:  Reviewed for med. necessity/level of care/duration of stay  If discussed at Long Length of Stay Meetings, dates discussed:    Comments:  4/15 1140a debbie Jasper Ruminski rn,bsn pt states has trouble affording copays since wife in nsg home and he has extra exspences. he has brilinta 30day free card adn knows copay. did give pt pt assist forms for meds not on 4.00 list at [facility]. gave him 2 prescription cards that may help w brand names. had voucher for vial of insulin that i gave to pt also. talked about preserving medicare d funds by using 4.00 list as much as possible.enc pt to be sure to take effient w stent.  4/14 1529 debbie Edwards Mckelvie rn,bsn spoke w pt, he does have medicare d program to assist w meds. gave pt effient 30day free and copay assist card.pt has 40.00 per month copay.

## 2012-05-10 ENCOUNTER — Encounter (HOSPITAL_COMMUNITY): Payer: Self-pay | Admitting: Nurse Practitioner

## 2012-05-10 DIAGNOSIS — I214 Non-ST elevation (NSTEMI) myocardial infarction: Secondary | ICD-10-CM

## 2012-05-10 DIAGNOSIS — IMO0001 Reserved for inherently not codable concepts without codable children: Secondary | ICD-10-CM

## 2012-05-10 DIAGNOSIS — I255 Ischemic cardiomyopathy: Secondary | ICD-10-CM

## 2012-05-10 LAB — GLUCOSE, CAPILLARY: Glucose-Capillary: 87 mg/dL (ref 70–99)

## 2012-05-10 LAB — BASIC METABOLIC PANEL
BUN: 7 mg/dL (ref 6–23)
CO2: 31 mEq/L (ref 19–32)
Calcium: 8.9 mg/dL (ref 8.4–10.5)
GFR calc non Af Amer: >90 mL/min (ref >90)
Glucose, Bld: 81 mg/dL (ref 70–99)
Potassium: 3.7 mEq/L (ref 3.5–5.1)

## 2012-05-10 LAB — CBC
HCT: 42.6 % (ref 39.0–52.0)
Hemoglobin: 14.7 g/dL (ref 13.0–17.0)
MCH: 27.4 pg (ref 26.0–34.0)
MCHC: 34.5 g/dL (ref 30.0–36.0)
RBC: 5.37 MIL/uL (ref 4.22–5.81)

## 2012-05-10 MED ORDER — LOSARTAN POTASSIUM 25 MG PO TABS: 25.0000 mg | ORAL_TABLET | Freq: Every day | ORAL | Status: DC

## 2012-05-10 MED ORDER — FENOFIBRATE 54 MG PO TABS: 54.0000 mg | ORAL_TABLET | Freq: Every day | ORAL | Status: DC

## 2012-05-10 MED ORDER — NITROGLYCERIN 0.4 MG SL SUBL: 0.4000 mg | SUBLINGUAL_TABLET | SUBLINGUAL | Status: DC | PRN

## 2012-05-10 MED ORDER — ATORVASTATIN CALCIUM 20 MG PO TABS: 20.0000 mg | ORAL_TABLET | Freq: Every day | ORAL | Status: DC

## 2012-05-10 MED ORDER — ACETAMINOPHEN 500 MG PO TABS: 1000.0000 mg | ORAL_TABLET | Freq: Four times a day (QID) | ORAL | Status: DC | PRN

## 2012-05-10 MED ORDER — FENOFIBRATE 54 MG PO TABS
54.0000 mg | ORAL_TABLET | Freq: Every day | ORAL | Status: DC
  Administered 2012-05-10: 54 mg via ORAL
  Filled 2012-05-10: qty 1

## 2012-05-10 MED ORDER — ASPIRIN 81 MG PO TABS: 81.0000 mg | ORAL_TABLET | Freq: Every day | ORAL | Status: DC

## 2012-05-10 MED ORDER — BIOTENE DRY MOUTH MT LIQD
15.0000 mL | Freq: Two times a day (BID) | OROMUCOSAL | Status: DC
  Administered 2012-05-10: 15 mL via OROMUCOSAL

## 2012-05-10 MED ORDER — PRASUGREL HCL 10 MG PO TABS: 10.0000 mg | ORAL_TABLET | Freq: Every day | ORAL | Status: DC

## 2012-05-10 MED ORDER — CARVEDILOL 3.125 MG PO TABS: 3.1250 mg | ORAL_TABLET | Freq: Two times a day (BID) | ORAL | Status: DC

## 2012-05-10 MED ORDER — LOSARTAN POTASSIUM 25 MG PO TABS
25.0000 mg | ORAL_TABLET | Freq: Every day | ORAL | Status: DC
  Administered 2012-05-10: 25 mg via ORAL
  Filled 2012-05-10: qty 1

## 2012-05-10 MED FILL — Heparin Sodium (Porcine) 100 Unt/ML in Sodium Chloride 0.45%: INTRAMUSCULAR | Qty: 250 | Status: AC

## 2012-05-10 NOTE — Progress Notes (Signed)
CARDIAC REHAB PHASE I   PRE:  Rate/Rhythm: 83SR  BP:  Supine: 131/54  Sitting:   Standing:    SaO2:   MODE:  Ambulation: 80 ft   POST:  Rate/Rhythm: 106ST  BP:  Supine:   Sitting: 113/53  Standing:    SaO2:  0910-1017 Pt walked 80 ft with rolling walker stopping 4 times to rest. C/o leg pain. Pt stated he would do better later as it takes him a while to get moving. If not d/ced staff need to walk with pt later. Education completed. Asked pt how his DM is controlled and he stated he was supposed to take insulin and meds but he has not been able to afford with wife in SNF. Notified NP that pt stated not taking DM meds. Pt states he sometimes only eats one meal a day. I asked how this affected his DM and he said he tried not to think about it. Eats canned foods a lot since it is hard for him to cook.  Discussed looking for low sodium foods. Did not give ex ed as he has difficulty walking and is limited. Rides scooter at grocery store and mainly walks in the house. Has done CRP 2 before and gave permission to refer to [facility].   Luetta Nutting, RN BSN  05/10/2012 10:06 AM

## 2012-05-10 NOTE — Progress Notes (Signed)
    Subjective:  Feels well. No CP or dyspnea at rest this am.   Objective:  Vital Signs in the last 24 hours: Temp:  [97.4 F (36.3 C)-98.3 F (36.8 C)] 97.4 F (36.3 C) (04/15 0412) Pulse Rate:  [53-77] 69 (04/15 0100) Resp:  [13-26] 16 (04/15 0400) BP: (111-160)/(37-79) 115/47 mmHg (04/15 0100) SpO2:  [93 %-99 %] 98 % (04/15 0100) Weight:  [167.831 kg (370 lb)] 167.831 kg (370 lb) (04/14 1410)  Intake/Output from previous day: 04/14 0701 - 04/15 0700 In: 840 [P.O.:240; I.V.:600] Out: 2750 [Urine:2750]  Physical Exam: Pt is alert and oriented, morbidly obese male in NAD HEENT: normal Neck: JVP - normal, carotids 2+= without bruits Lungs: CTA bilaterally CV: RRR without murmur or gallop Abd: soft, NT, obese Ext: diffuse leg edema, right radial site clear Skin: warm/dry no rash  Lab Results:  Recent Labs  05/09/12 1518 05/10/12 0430  WBC 7.6 10.8*  HGB 14.1 14.7  PLT 198 217    Recent Labs  05/09/12 1518 05/10/12 0430  NA  --  139  K  --  3.7  CL  --  101  CO2  --  31  GLUCOSE  --  81  BUN  --  7  CREATININE 0.76 0.76   No results found for this basename: TROPONINI, CK, MB,  in the last 72 hours  Cardiac Studies: Cardiac catheterization:  Final Conclusions:  1. Patent left main stent with mild restenosis distally. The LAD is known to be occluded distally close to the apex with some left to left collaterals.  2. Severe right coronary artery thrombotic stenosis which is likely the culprit for non-ST elevation myocardial infarction.  3. Successful thrombectomy, balloon angioplasty and 2 overlapped drug-eluting stents to cover the mid and proximal RCA.  Recommendations:  Recommend dual antiplatelet therapy for at least one year. Aggressive treatment of risk factors is recommended. The patient will need social work consult to help him with his medications.   Tele: Sinus rhythm  Assessment/Plan:  1. NSTEMI - s/p PCI of the RCA with overlapping DES 2.  Ischemic cardiomyopathy LVEF 40 3. Type 2 DM 4. Morbid obesity 5. HTN 6. Hyperlipidemia  Pt stable post-PCI. Cont ASA/Effient - he was given 30 day card and needs to get assistance. Start ARB in setting Type 2 DM, LVEF 40%, and CAD. Continue statin drug. Change Lopid to tricor to reduce rhabdo risk. Discharge today if able to ambulate with cardiac rehab - uses a walker. Follow-up in [place] office.  [name], M.D. 05/10/2012, 7:30 AM

## 2012-05-10 NOTE — Discharge Summary (Signed)
Patient ID: [name],  MRN: [id number], DOB/AGE: [birth year] 66 y.o.  Admit date: 05/09/2012 Discharge date: 05/10/2012  Primary Care Provider: [facility] Primary Cardiologist: Judie Petit. Kirke Corin, MD  Discharge Diagnoses Principal Problem:   NSTEMI (non-ST elevated myocardial infarction)   **s/p PCI/DES x 2 to the RCA this admission  Active Problems:   Coronary artery disease   Morbid obesity   IDDM (insulin dependent diabetes mellitus)   Cardiomyopathy, ischemic  **EF 40% by left ventriculography this admission   Hypertension   Hyperlipidemia   Noncompliance  Allergies No Known Allergies  Procedures  Cardiac Catheterization and Percutaneous Coronary Intervention 05/09/2012  Procedural Findings:  Hemodynamics: AO:  115/60   mmHg LV:  117/10    mmHg LVEDP: 14  mmHg  Coronary angiography: Coronary dominance: Right     Left Main:  Normal in size. The stent is noted in the mid and distal segment which is widely patent with no evidence of restenosis. There is 20% stenosis either at the distal segment of the stent or just distal to the stent.  Left Anterior Descending (LAD):  Normal in size and moderately calcified throughout its course. There is 20% diffuse disease proximally. There is diffuse 40% disease throughout the midsegment. The vessel is occluded distally with faint left to left collaterals supplying the apex.  1st diagonal (D1):  Normal in size with 60-70% ostial stenosis.  2nd diagonal (D2):  Normal in size with minor irregularities.  3rd diagonal (D3):  Normal in size with no significant disease.  Circumflex (LCx):  Normal in size and nondominant. There is extensive percent discrete stenosis in the midsegment.  1st obtuse marginal:  Medium in size with minor irregularities.  2nd obtuse marginal:  Medium in size with minor irregularities.  3rd obtuse marginal:  Normal in size with no significant disease.             Right Coronary Artery: Very large in size and  dominant. There is a 60% ostial stenosis. This is followed in the proximal segment by a 90% stenosis. In the midsegment, there is 99% hazy stenosis with a filling defect suggestive of a thrombus. Distal to this there is TIMI 1 flow in the vessel. The RCA gets faint left-to-right collaterals.    **The RCA was successfully treated with thrombectomy and stenting using 2 overlapping DES (3.5 x 38 mm Promus DES mid and 4.0 x 24 mm Promus DES proximally**   Left ventriculography: Left ventricular systolic function is mildly reduced , LVEF is estimated at 40 %, there is no significant mitral regurgitation . Moderate inferior wall hypokinesis. _____________  History of Present Illness  67 year old male with prior history of coronary artery disease and anterior myocardial infarction in June 2012 with subsequent stenting of the distal left main. Due to finances, patient has been noncompliant with some of his home medications. He was in his usual state of health until the day prior to admission when he developed recurrent chest discomfort prompting him to present to [facility] where he ruled in for non-ST segment elevation myocardial infarction. He was also found to have asymptomatic nonsustained ventricular tachycardia. He was seen by cardiology and transferred to [facility] for further evaluation on April 14.  Hospital Course  Patient was pain-free upon arrival to [facility]. He underwent diagnostic cardiac catheterization on April 14 revealing new, hazy stenosis within the right coronary artery. Previously placed left main stent was patent. The apical LAD was known to be occluded with left to left  collaterals. The right coronary artery was felt to be the culprit vessel and this was subsequently successfully stented using a 2 overlapping Promus drug-eluting stents. Patient was maintained on aspirin, effient, and statin therapy post procedure without recurrent symptoms or limitations. In  the setting of an ischemic cardiomyopathy with an EF of 40% by ventriculography, we have initiated low-dose beta blocker and ARB therapy. We have arranged for followup early next week and he'll be discharged home today in good condition. We have stressed the importance of lifestyle and medication compliance. He has worked with cardiac rehabilitation and plans to enroll in cardiac rehabilitation in [place]. We have worked with case management to ensure that his medications are as affordable as possible.  Discharge Vitals Blood pressure 121/42, pulse 71, temperature 97.7 F (36.5 C), temperature source Oral, resp. rate 18, height 6' (1.829 m), weight 370 lb (167.831 kg), SpO2 93.00%.  Filed Weights   05/09/12 1410  Weight: 370 lb (167.831 kg)   Labs  CBC  Recent Labs  05/09/12 1518 05/10/12 0430  WBC 7.6 10.8*  HGB 14.1 14.7  HCT 41.4 42.6  MCV 79.2 79.3  PLT 198 217   Basic Metabolic Panel  Recent Labs  05/09/12 1518 05/10/12 0430  NA  --  139  K  --  3.7  CL  --  101  CO2  --  31  GLUCOSE  --  81  BUN  --  7  CREATININE 0.76 0.76  CALCIUM  --  8.9   Disposition  Pt is being discharged home today in good condition.  Follow-up Plans & Appointments      Follow-up Information   Follow up with Lorine Bears, MD On 05/16/2012. (1:45 PM)    Contact information:   [address] [place] [address] [phone]       Follow up with [facility], MD. (as scheduled)    Contact information:   [address] [place] [address] [phone]      Discharge Medications    Medication List    STOP taking these medications       gemfibrozil 600 MG tablet  Commonly known as:  LOPID      TAKE these medications       acetaminophen 500 MG tablet  Commonly known as:  TYLENOL  Take 2 tablets (1,000 mg total) by mouth every 6 (six) hours as needed for pain (knee and chest pain).     aspirin 81 MG tablet  Take 1 tablet (81 mg  total) by mouth daily.     atorvastatin 20 MG tablet  Commonly known as:  LIPITOR  Take 1 tablet (20 mg total) by mouth daily at 6 PM.     carvedilol 3.125 MG tablet  Commonly known as:  COREG  Take 1 tablet (3.125 mg total) by mouth 2 (two) times daily with a meal.     fenofibrate 54 MG tablet  Take 1 tablet (54 mg total) by mouth daily.     furosemide 40 MG tablet  Commonly known as:  LASIX  Take 40 mg by mouth daily.     glyBURIDE 5 MG tablet  Commonly known as:  DIABETA  Take 5 mg by mouth 2 (two) times daily with a meal.     HUMULIN N 100 UNIT/ML injection  Generic drug:  insulin NPH  Inject 50 Units into the skin 2 (two) times daily.     losartan 25 MG tablet  Commonly known as:  COZAAR  Take 1 tablet (25 mg total) by mouth daily.     nitroGLYCERIN 0.4 MG SL tablet  Commonly known as:  NITROSTAT  Place 1 tablet (0.4 mg total) under the tongue every 5 (five) minutes as needed for chest pain.     nystatin 100000 UNIT/GM Powd  Apply topically 3 (three) times daily.     prasugrel 10 MG Tabs  Commonly known as:  EFFIENT  Take 1 tablet (10 mg total) by mouth daily.      Outstanding Labs/Studies  bmet at f/u (new ARB)  Duration of Discharge Encounter   Greater than 30 minutes including physician time.  Signed, Nicolasa Ducking NP 05/10/2012, 10:09 AM

## 2012-05-10 NOTE — Progress Notes (Signed)
Discharged home , stable, belongings with pt. Discharged instructions and prescription given to pt.

## 2012-05-11 ENCOUNTER — Telehealth: Payer: Self-pay | Admitting: *Deleted

## 2012-05-11 ENCOUNTER — Telehealth: Payer: Self-pay

## 2012-05-11 NOTE — Telephone Encounter (Signed)
tcm

## 2012-05-11 NOTE — Telephone Encounter (Signed)
TCM management call - Unable to reach patient today. No voicemail available. Will route to triage for 05/12/12.

## 2012-05-11 NOTE — Telephone Encounter (Signed)
Message copied by [facility], Evanee Lubrano E on Wed May 11, 2012 10:22 AM ------      Message from: [place] E      Created: Wed May 11, 2012 10:20 AM      Regarding: TCM       TCM ------

## 2012-05-11 NOTE — Telephone Encounter (Signed)
TCM attempt #1 LMTCB

## 2012-05-12 NOTE — Telephone Encounter (Signed)
SPOKE WITH PT  IS DOING FINE FEELS GOOD  DOES C/O  OF A LITTLE WEAKNESS  WAS ABLE  TO GET  ALL MEDS FROM DISCHARGE AND HAS STRATED TAKING THEM   AND ALSO  HAS FOLLOW UP WITH  DR ARIDA ON  MON  05-16-12 AT 1:45 PM

## 2012-05-12 NOTE — Telephone Encounter (Signed)
Patient contacted regarding discharge from [facility] on 05/10/12.  Patient understands to follow up with provider Dr. Kirke Corin on 05/16/12 at 1:45 pm at [facility]. Patient understands discharge instructions? yes Patient understands medications and regiment? yes Patient understands to bring all medications to this visit? yes  Pt denies symptoms of cp, sob or dizziness. He admits to feeling "weak and tired" but attributes this to hospitalization and emphasizes, "I feel really good" He has all meds as prescribed at d/c and confirms compliance with these He will call us with any concerns before appt

## 2012-05-16 ENCOUNTER — Ambulatory Visit (INDEPENDENT_AMBULATORY_CARE_PROVIDER_SITE_OTHER): Payer: Medicare Other | Admitting: Cardiovascular Disease

## 2012-05-16 ENCOUNTER — Encounter: Payer: Self-pay | Admitting: Cardiovascular Disease

## 2012-05-16 VITALS — BP 120/74 | HR 83 | Ht 72.0 in | Wt 385.5 lb

## 2012-05-16 DIAGNOSIS — I2510 Atherosclerotic heart disease of native coronary artery without angina pectoris: Secondary | ICD-10-CM

## 2012-05-16 DIAGNOSIS — I2589 Other forms of chronic ischemic heart disease: Secondary | ICD-10-CM

## 2012-05-16 DIAGNOSIS — E785 Hyperlipidemia, unspecified: Secondary | ICD-10-CM

## 2012-05-16 DIAGNOSIS — I10 Essential (primary) hypertension: Secondary | ICD-10-CM

## 2012-05-16 DIAGNOSIS — I255 Ischemic cardiomyopathy: Secondary | ICD-10-CM

## 2012-05-16 NOTE — Assessment & Plan Note (Signed)
He is doing well at this time with no recurrent angina. He had recent angioplasty and drug-eluting stent placement x2 to the right coronary artery. There was minimal restenosis in the left main coronary artery. I recommend lifelong dual antiplatelet therapy. In one year, we can likely switch Effient to Plavix. I will refer him to cardiac rehabilitation strongly advised him to continue weight loss.

## 2012-05-16 NOTE — Assessment & Plan Note (Signed)
Blood pressure is well controlled 

## 2012-05-16 NOTE — Progress Notes (Signed)
HPI  This is a 67 year old male who is here today for a followup visit after recent non-ST elevation myocardial infarction. He has known history of coronary artery disease. In 2012, he had anterior elevation myocardial infarction and was noted to have severe left main coronary artery stenosis due to plaque rupture. He was not a candidate for CABG due to morbid obesity. He underwent angioplasty and drug-eluting stent placement without complications. He presented recently to [facility] with chest pain on non-ST elevation myocardial infarction. He was transferred to [facility] where I performed cardiac catheterization via the right radial artery. This showed patent left main stent. He was found to have subtotal occlusion of the right coronary artery with large thrombus. He underwent successful thrombectomy into drug-eluting stent placements to the mid and proximal RCA. Since then, he reports complete resolution of chest pain. His dyspnea has improved significantly. He is taking most of his medications but still with difficulty affording his diabetes medications. He will get paid on Wednesday.   No Known Allergies   Current Outpatient Prescriptions on File Prior to Visit  Medication Sig Dispense Refill  . acetaminophen (TYLENOL) 500 MG tablet Take 2 tablets (1,000 mg total) by mouth every 6 (six) hours as needed for pain (knee and chest pain).  30 tablet    . aspirin 81 MG tablet Take 1 tablet (81 mg total) by mouth daily.      Marland Kitchen atorvastatin (LIPITOR) 20 MG tablet Take 1 tablet (20 mg total) by mouth daily at 6 PM.  30 tablet  3  . carvedilol (COREG) 3.125 MG tablet Take 1 tablet (3.125 mg total) by mouth 2 (two) times daily with a meal.  60 tablet  3  . nitroGLYCERIN (NITROSTAT) 0.4 MG SL tablet Place 1 tablet (0.4 mg total) under the tongue every 5 (five) minutes as needed for chest pain.  25 tablet  3  . prasugrel (EFFIENT) 10 MG TABS Take 1 tablet (10 mg total) by mouth daily.  30 tablet  3   No current  facility-administered medications on file prior to visit.     Past Medical History  Diagnosis Date  . Hypertension   . Hyperlipidemia   . Morbid obesity   . NSVT (nonsustained ventricular tachycardia)     a. 04/2012 post MI; normal LVF   . Physical deconditioning     requiring outpatient PT  . Osteoarthritis   . Morbid obesity   . Lower extremity cellulitis   . Peripheral edema   . Diabetes mellitus      Insulin-dependent  . Chronic knee pain   . Anemia, unspecified   . Ischemic cardiomyopathy     a. 4.2014 EF 40% by LV gram.  . Coronary artery disease     a. s/p anterior STEMI 07/13/10: tx with Promus DES to distal LM; s/p POBA to distal LAD;  b. 04/2012 NSTEMI/Cath/PCI: LM 20%, LAD 20% p, 19m, 100d, D1 60-70ost, D2 nl, D3 nl, LCX mid dzs, OM1/2/3 min irregs, RCA dom 60ost, 90p, 15m (3.5x38 & 4.0x24 Promus DES'), EF 40%, inf HK.     Past Surgical History  Procedure Laterality Date  . Coronary stent placement      drug-eluting to the distal left main and PTCA of the distal left anterior descending.  . Skin graft Right     history  . Knee surgery Left   . Cataract surgery    . Cardiac catheterization    . Cardiac catheterization  x2 stents;[facility]     Family History  Problem Relation Age of Onset  . Heart attack Brother 60    died  . Coronary artery disease      strongly positive family hx of  . Heart attack Father      History   Social History  . Marital Status: Married    Spouse Name: [name]    Number of Children: 2  . Years of Education: [name]   Occupational History  . Not on file.   Social History Main Topics  . Smoking status: Never Smoker   . Smokeless tobacco: Not on file  . Alcohol Use: No  . Drug Use: No  . Sexually Active: Not on file   Other Topics Concern  . Not on file   Social History Narrative  . No narrative on file     PHYSICAL EXAM   BP 120/74  Pulse 83  Ht 6' (1.829 m)  Wt 385 lb 8 oz (174.862 kg)  BMI 52.27  kg/m2 Constitutional: He is oriented to person, place, and time. He appears well-developed and well-nourished. No distress.  HENT: No nasal discharge.  Head: Normocephalic and atraumatic.  Eyes: Pupils are equal and round. Right eye exhibits no discharge. Left eye exhibits no discharge.  Neck: Normal range of motion. Neck supple. No JVD present. No thyromegaly present.  Cardiovascular: Normal rate, regular rhythm, normal heart sounds and. Exam reveals no gallop and no friction rub. No murmur heard.  Pulmonary/Chest: Effort normal and breath sounds normal. No stridor. No respiratory distress. He has no wheezes. He has no rales. He exhibits no tenderness.  Abdominal: Soft. Bowel sounds are normal. He exhibits no distension. There is no tenderness. There is no rebound and no guarding.  Musculoskeletal: Normal range of motion. He exhibits +1 edema and no tenderness.  Neurological: He is alert and oriented to person, place, and time. Coordination normal.  Skin: Skin is warm and dry. Severe stasis dermatitis in the lower extremities. He is not diaphoretic. No erythema. No pallor.  Psychiatric: He has a normal mood and affect. His behavior is normal. Judgment and thought content normal.  Right radial pulse is normal with no hematoma     [id number]  Rhythm  -Old anterior infarct.   -  Nonspecific T-abnormality.   Low voltage -possible pulmonary disease.   ABNORMAL    ASSESSMENT AND PLAN

## 2012-05-16 NOTE — Assessment & Plan Note (Signed)
Continue treatment with carvedilol. He is to resume losartan and Lasix once he gets his medications on Wednesday.

## 2012-05-16 NOTE — Patient Instructions (Addendum)
Continue same medications.  Refer to cardiac rehab.  Follow up in 3 months.

## 2012-05-16 NOTE — Assessment & Plan Note (Signed)
Continue treatment with atorvastatin. 

## 2012-05-24 ENCOUNTER — Telehealth: Payer: Self-pay | Admitting: *Deleted

## 2012-05-24 NOTE — Telephone Encounter (Signed)
I called [facility] back and spoke with pharmacist She asks if pt is to be taking both lisinopril and losartan.;  I explained, per last office note, he should only be taking the losartan She also asks if he is supposed to be taking both Effient and Plavix I explained, per last office note, he was switched from Effient to plavix and should only be taking this one medication Understanding verb

## 2012-05-24 NOTE — Telephone Encounter (Signed)
[facility] is callin gto verify if patient should be taking both Cozaar and Lisinopril , Effient  And Plavix.  Would like a call back from the nurse.  [phone]   Reference number [id number]

## 2012-05-26 DIAGNOSIS — I2119 ST elevation (STEMI) myocardial infarction involving other coronary artery of inferior wall: Secondary | ICD-10-CM

## 2012-05-26 HISTORY — DX: ST elevation (STEMI) myocardial infarction involving other coronary artery of inferior wall: I21.19

## 2012-06-22 ENCOUNTER — Inpatient Hospital Stay (HOSPITAL_COMMUNITY)
Admission: EM | Admit: 2012-06-22 | Discharge: 2012-06-25 | DRG: 250 | Disposition: A | Discharge status: Home / Self Care | Payer: Medicare Other | Attending: Cardiovascular Disease | Admitting: Cardiovascular Disease

## 2012-06-22 ENCOUNTER — Encounter: Payer: Self-pay | Admitting: Nurse Practitioner

## 2012-06-22 ENCOUNTER — Encounter (HOSPITAL_COMMUNITY)
Admission: EM | Disposition: A | Discharge status: Home / Self Care | Payer: Self-pay | Source: Home / Self Care | Attending: Cardiovascular Disease

## 2012-06-22 DIAGNOSIS — I2589 Other forms of chronic ischemic heart disease: Secondary | ICD-10-CM | POA: Diagnosis present

## 2012-06-22 DIAGNOSIS — I2510 Atherosclerotic heart disease of native coronary artery without angina pectoris: Secondary | ICD-10-CM

## 2012-06-22 DIAGNOSIS — Z91199 Patient's noncompliance with other medical treatment and regimen due to unspecified reason: Secondary | ICD-10-CM

## 2012-06-22 DIAGNOSIS — Z9861 Coronary angioplasty status: Secondary | ICD-10-CM

## 2012-06-22 DIAGNOSIS — Z8249 Family history of ischemic heart disease and other diseases of the circulatory system: Secondary | ICD-10-CM

## 2012-06-22 DIAGNOSIS — E119 Type 2 diabetes mellitus without complications: Secondary | ICD-10-CM | POA: Diagnosis present

## 2012-06-22 DIAGNOSIS — Z9119 Patient's noncompliance with other medical treatment and regimen: Secondary | ICD-10-CM

## 2012-06-22 DIAGNOSIS — I472 Ventricular tachycardia, unspecified: Secondary | ICD-10-CM | POA: Diagnosis present

## 2012-06-22 DIAGNOSIS — Z7982 Long term (current) use of aspirin: Secondary | ICD-10-CM

## 2012-06-22 DIAGNOSIS — Z6841 Body Mass Index (BMI) 40.0 and over, adult: Secondary | ICD-10-CM

## 2012-06-22 DIAGNOSIS — Y849 Medical procedure, unspecified as the cause of abnormal reaction of the patient, or of later complication, without mention of misadventure at the time of the procedure: Secondary | ICD-10-CM | POA: Diagnosis present

## 2012-06-22 DIAGNOSIS — I2119 ST elevation (STEMI) myocardial infarction involving other coronary artery of inferior wall: Secondary | ICD-10-CM

## 2012-06-22 DIAGNOSIS — I10 Essential (primary) hypertension: Secondary | ICD-10-CM

## 2012-06-22 DIAGNOSIS — E785 Hyperlipidemia, unspecified: Secondary | ICD-10-CM | POA: Diagnosis present

## 2012-06-22 DIAGNOSIS — Z79899 Other long term (current) drug therapy: Secondary | ICD-10-CM

## 2012-06-22 DIAGNOSIS — I4729 Other ventricular tachycardia: Secondary | ICD-10-CM | POA: Diagnosis present

## 2012-06-22 DIAGNOSIS — T82897A Other specified complication of cardiac prosthetic devices, implants and grafts, initial encounter: Principal | ICD-10-CM | POA: Diagnosis present

## 2012-06-22 DIAGNOSIS — Z794 Long term (current) use of insulin: Secondary | ICD-10-CM

## 2012-06-22 HISTORY — DX: Acute myocardial infarction, unspecified: I21.9

## 2012-06-22 HISTORY — PX: LEFT HEART CATHETERIZATION WITH CORONARY ANGIOGRAM: SHX5451

## 2012-06-22 LAB — COMPREHENSIVE METABOLIC PANEL
Alkaline Phosphatase: 81 U/L (ref 39–117)
BUN: 13 mg/dL (ref 6–23)
Chloride: 100 mEq/L (ref 96–112)
GFR calc Af Amer: >90 mL/min (ref >90)
Glucose, Bld: 270 mg/dL — ABNORMAL HIGH (ref 70–99)
Potassium: 4.1 mEq/L (ref 3.5–5.1)
Total Bilirubin: 0.8 mg/dL (ref 0.3–1.2)

## 2012-06-22 LAB — CBC WITH DIFFERENTIAL/PLATELET
Hemoglobin: 15 g/dL (ref 13.0–17.0)
Lymphocytes Relative: 5 % — ABNORMAL LOW (ref 12–46)
Lymphs Abs: 0.6 10*3/uL — ABNORMAL LOW (ref 0.7–4.0)
Monocytes Relative: 6 % (ref 3–12)
Neutro Abs: 11.4 10*3/uL — ABNORMAL HIGH (ref 1.7–7.7)
Neutrophils Relative %: 87 % — ABNORMAL HIGH (ref 43–77)
RBC: 5.54 MIL/uL (ref 4.22–5.81)

## 2012-06-22 LAB — MRSA PCR SCREENING: MRSA by PCR: NEGATIVE

## 2012-06-22 SURGERY — LEFT HEART CATHETERIZATION WITH CORONARY ANGIOGRAM
Anesthesia: LOCAL

## 2012-06-22 MED ORDER — HEPARIN SODIUM (PORCINE) 5000 UNIT/ML IJ SOLN
5000.0000 [IU] | Freq: Three times a day (TID) | INTRAMUSCULAR | Status: DC
  Filled 2012-06-22 (×2): qty 1

## 2012-06-22 MED ORDER — PRASUGREL HCL 10 MG PO TABS
10.0000 mg | ORAL_TABLET | Freq: Every day | ORAL | Status: DC
  Administered 2012-06-23 – 2012-06-25 (×3): 10 mg via ORAL
  Filled 2012-06-22 (×3): qty 1

## 2012-06-22 MED ORDER — ONDANSETRON HCL 4 MG/2ML IJ SOLN: 4.0000 mg | Freq: Four times a day (QID) | INTRAMUSCULAR | Status: DC | PRN

## 2012-06-22 MED ORDER — HEPARIN SODIUM (PORCINE) 1000 UNIT/ML IJ SOLN
INTRAMUSCULAR | Status: AC
  Filled 2012-06-22: qty 1

## 2012-06-22 MED ORDER — SODIUM CHLORIDE 0.9 % IV SOLN
INTRAVENOUS | Status: DC
  Administered 2012-06-22: 19:00:00 via INTRAVENOUS
  Administered 2012-06-23: 100 mL/h via INTRAVENOUS

## 2012-06-22 MED ORDER — INSULIN NPH (HUMAN) (ISOPHANE) 100 UNIT/ML ~~LOC~~ SUSP
50.0000 [IU] | Freq: Every day | SUBCUTANEOUS | Status: DC
  Administered 2012-06-22 – 2012-06-24 (×3): 50 [IU] via SUBCUTANEOUS
  Filled 2012-06-22 (×2): qty 10

## 2012-06-22 MED ORDER — ATORVASTATIN CALCIUM 80 MG PO TABS
80.0000 mg | ORAL_TABLET | Freq: Every day | ORAL | Status: DC
  Administered 2012-06-23 – 2012-06-25 (×3): 80 mg via ORAL
  Filled 2012-06-22 (×3): qty 1

## 2012-06-22 MED ORDER — LOSARTAN POTASSIUM 25 MG PO TABS
25.0000 mg | ORAL_TABLET | Freq: Every day | ORAL | Status: DC
  Administered 2012-06-22 – 2012-06-25 (×4): 25 mg via ORAL
  Filled 2012-06-22 (×4): qty 1

## 2012-06-22 MED ORDER — CARVEDILOL 3.125 MG PO TABS
3.1250 mg | ORAL_TABLET | Freq: Two times a day (BID) | ORAL | Status: DC
  Administered 2012-06-23 – 2012-06-25 (×5): 3.125 mg via ORAL
  Filled 2012-06-22 (×8): qty 1

## 2012-06-22 MED ORDER — NITROGLYCERIN 0.4 MG SL SUBL: 0.4000 mg | SUBLINGUAL_TABLET | SUBLINGUAL | Status: DC | PRN

## 2012-06-22 MED ORDER — INSULIN ASPART 100 UNIT/ML ~~LOC~~ SOLN
0.0000 [IU] | Freq: Three times a day (TID) | SUBCUTANEOUS | Status: DC
  Administered 2012-06-23 – 2012-06-24 (×3): 2 [IU] via SUBCUTANEOUS
  Administered 2012-06-25: 3 [IU] via SUBCUTANEOUS

## 2012-06-22 MED ORDER — ASPIRIN EC 81 MG PO TBEC
81.0000 mg | DELAYED_RELEASE_TABLET | Freq: Every day | ORAL | Status: DC
  Administered 2012-06-23 – 2012-06-25 (×3): 81 mg via ORAL
  Filled 2012-06-22 (×3): qty 1

## 2012-06-22 MED ORDER — HEPARIN SODIUM (PORCINE) 5000 UNIT/ML IJ SOLN
5000.0000 [IU] | Freq: Three times a day (TID) | INTRAMUSCULAR | Status: DC
  Administered 2012-06-22 – 2012-06-25 (×8): 5000 [IU] via SUBCUTANEOUS
  Filled 2012-06-22 (×12): qty 1

## 2012-06-22 MED ORDER — SODIUM CHLORIDE 0.9 % IV SOLN: 250.0000 mL | INTRAVENOUS | Status: DC | PRN

## 2012-06-22 MED ORDER — VERAPAMIL HCL 2.5 MG/ML IV SOLN
INTRAVENOUS | Status: AC
  Filled 2012-06-22: qty 2

## 2012-06-22 MED ORDER — EPTIFIBATIDE 75 MG/100ML IV SOLN: 15.0000 mg/h | INTRAVENOUS | Status: DC

## 2012-06-22 MED ORDER — CARVEDILOL 3.125 MG PO TABS: 3.1250 mg | ORAL_TABLET | Freq: Two times a day (BID) | ORAL | Status: DC

## 2012-06-22 MED ORDER — MIDAZOLAM HCL 2 MG/2ML IJ SOLN
INTRAMUSCULAR | Status: AC
  Filled 2012-06-22: qty 2

## 2012-06-22 MED ORDER — INSULIN NPH (HUMAN) (ISOPHANE) 100 UNIT/ML ~~LOC~~ SUSP
50.0000 [IU] | Freq: Every day | SUBCUTANEOUS | Status: DC
  Administered 2012-06-23 – 2012-06-25 (×3): 50 [IU] via SUBCUTANEOUS
  Filled 2012-06-22: qty 10

## 2012-06-22 MED ORDER — SODIUM CHLORIDE 0.9 % IJ SOLN: 3.0000 mL | INTRAMUSCULAR | Status: DC | PRN

## 2012-06-22 MED ORDER — BIVALIRUDIN 250 MG IV SOLR
INTRAVENOUS | Status: AC
  Filled 2012-06-22: qty 250

## 2012-06-22 MED ORDER — EPTIFIBATIDE 75 MG/100ML IV SOLN
2.0000 ug/kg/min | INTRAVENOUS | Status: DC
  Filled 2012-06-22: qty 100

## 2012-06-22 MED ORDER — ACETAMINOPHEN 325 MG PO TABS: 650.0000 mg | ORAL_TABLET | ORAL | Status: DC | PRN

## 2012-06-22 MED ORDER — EPTIFIBATIDE 75 MG/100ML IV SOLN
15.0000 mg/h | INTRAVENOUS | Status: AC
  Administered 2012-06-22 – 2012-06-23 (×2): 15 mg/h via INTRAVENOUS
  Filled 2012-06-22 (×4): qty 100

## 2012-06-22 MED ORDER — SODIUM CHLORIDE 0.9 % IJ SOLN
3.0000 mL | Freq: Two times a day (BID) | INTRAMUSCULAR | Status: DC
  Administered 2012-06-23 – 2012-06-25 (×4): 3 mL via INTRAVENOUS

## 2012-06-22 MED ORDER — FUROSEMIDE 40 MG PO TABS
40.0000 mg | ORAL_TABLET | Freq: Every day | ORAL | Status: DC
  Administered 2012-06-22 – 2012-06-25 (×4): 40 mg via ORAL
  Filled 2012-06-22 (×4): qty 1

## 2012-06-22 MED ORDER — HEPARIN (PORCINE) IN NACL 2-0.9 UNIT/ML-% IJ SOLN
INTRAMUSCULAR | Status: AC
  Filled 2012-06-22: qty 1000

## 2012-06-22 MED ORDER — PRASUGREL HCL 10 MG PO TABS
ORAL_TABLET | ORAL | Status: AC
  Filled 2012-06-22: qty 6

## 2012-06-22 MED ORDER — EPTIFIBATIDE BOLUS VIA INFUSION
22.5000 mg | Freq: Once | INTRAVENOUS | Status: DC
  Filled 2012-06-22: qty 30

## 2012-06-22 MED ORDER — FENTANYL CITRATE 0.05 MG/ML IJ SOLN
INTRAMUSCULAR | Status: AC
  Filled 2012-06-22: qty 2

## 2012-06-22 MED ORDER — ACETAMINOPHEN 500 MG PO TABS: 1000.0000 mg | ORAL_TABLET | Freq: Four times a day (QID) | ORAL | Status: DC | PRN

## 2012-06-22 MED ORDER — LIDOCAINE HCL (PF) 1 % IJ SOLN
INTRAMUSCULAR | Status: AC
  Filled 2012-06-22: qty 30

## 2012-06-22 MED ORDER — EPTIFIBATIDE 75 MG/100ML IV SOLN
INTRAVENOUS | Status: AC
  Administered 2012-06-23: 15 mg/h via INTRAVENOUS
  Filled 2012-06-22: qty 100

## 2012-06-22 NOTE — H&P (Signed)
    I have seen and examined the patient. I agree with the above note with the addition of : see cath report. Acute inferior STEMI due to late stent thrombosis in setting of non-adherence to dual antiplatelet therapy. S/p aspiration thrombectomy and balloon angioplasty.  Continue integerillin for 18 hours. Needs lifelong dual antiplatelet therapy.   Lorine Bears MD, [facility] 06/22/2012 6:03 PM

## 2012-06-22 NOTE — Progress Notes (Signed)
ANTICOAGULATION CONSULT NOTE - Initial Consult  Pharmacy Consult for Integrelin Indication: Post cath coverage  No Known Allergies  Patient Measurements: Height: 6' (182.9 cm) Weight: 385 lb 12.9 oz (175 kg) IBW/kg (Calculated) : 77.6  Vital Signs: BP: 91/56 mmHg (05/28 1900) Pulse Rate: 68 (05/28 1900)  Labs: No results found for this basename: HGB, HCT, PLT, APTT, LABPROT, INR, HEPARINUNFRC, CREATININE, CKTOTAL, CKMB, TROPONINI,  in the last 72 hours  Estimated Creatinine Clearance: 149.8 ml/min (by C-G formula based on Cr of 0.76).   Medical History: Past Medical History  Diagnosis Date  . Hypertension   . Hyperlipidemia   . Morbid obesity   . NSVT (nonsustained ventricular tachycardia)     a. 04/2012 post MI; normal LVF   . Physical deconditioning     requiring outpatient PT  . Osteoarthritis   . Morbid obesity   . Lower extremity cellulitis   . Peripheral edema   . Diabetes mellitus      Insulin-dependent  . Chronic knee pain   . Anemia, unspecified   . Ischemic cardiomyopathy     a. 4.2014 EF 40% by LV gram.  . Coronary artery disease     a. s/p anterior STEMI 07/13/10: tx with Promus DES to distal LM; s/p POBA to distal LAD;  b. 04/2012 NSTEMI/Cath/PCI: LM 20%, LAD 20% p, 64m, 100d, D1 60-70ost, D2 nl, D3 nl, LCX mid dzs, OM1/2/3 min irregs, RCA dom 60ost, 90p, 49m (3.5x38 & 4.0x24 Promus DES'), EF 40%, inf HK.    Medications:  Prescriptions prior to admission  Medication Sig Dispense Refill  . acetaminophen (TYLENOL) 500 MG tablet Take 2 tablets (1,000 mg total) by mouth every 6 (six) hours as needed for pain (knee and chest pain).  30 tablet    . aspirin 81 MG tablet Take 1 tablet (81 mg total) by mouth daily.      Marland Kitchen atorvastatin (LIPITOR) 20 MG tablet Take 1 tablet (20 mg total) by mouth daily at 6 PM.  30 tablet  3  . carvedilol (COREG) 3.125 MG tablet Take 1 tablet (3.125 mg total) by mouth 2 (two) times daily with a meal.  60 tablet  3  . nitroGLYCERIN  (NITROSTAT) 0.4 MG SL tablet Place 1 tablet (0.4 mg total) under the tongue every 5 (five) minutes as needed for chest pain.  25 tablet  3  . prasugrel (EFFIENT) 10 MG TABS Take 1 tablet (10 mg total) by mouth daily.  30 tablet  3    Assessment: 67 year old man, 175 kg to be on Integrelin x 18 hours after cath for antiplatelet coverage.  Scr 0.76 on 4/15. Goal of Therapy:     Plan:  Integrilin bolus of 22.5mg , then 15mg /hr x 18 hours.  CBC in 6 hours, monitoring for thrombocytopenia.  Mickeal Skinner 06/22/2012,7:58 PM

## 2012-06-22 NOTE — Progress Notes (Signed)
Pt arrived to 2900 from cath lab. C/o 4/10 CP, stat 12 lead EKG done. Dr Kirke Corin arrived at bedside. Pt now stating pain level 2/10. No orders received. Reviewed importance of notifying staff if CP becomes worse and use of call light. Oriented to unit and safety. Pt in bariatric bed. Monitoring closely.

## 2012-06-22 NOTE — CV Procedure (Addendum)
Cardiac Catheterization Procedure Note  Name: [name] MRN: [id number] DOB: Dec 26, 1945  Procedure: Left Heart Cath, Selective Coronary Angiography, LV angiography, aspiration thrombectomy and PTCA of the right coronary artery.  Indication: Acute inferior ST elevation myocardial infarction. This is a 67 year old male with known history of coronary  artery disease status post left main stenting in the past, recent non-ST elevation myocardial infarction in April of this year with subtotal occlusion of the right coronary artery. He underwent successful angioplasty and 2 overlapped drug-eluting stent placement. The patient has known history of non-compliance with dual antiplatelet therapy due to limited resources. He reports running out of Effient at least 2 days ago and possibly longer.  Medications:  Sedation:  2 mg IV Versed, 100 mcg IV Fentanyl  Contrast:  155 ML Omnipaque  Procedural Details: There was delays in starting the case due to morbid obesity and difficulty placing on cath table as well as lack of IV access which required calling the IV team.  The right wrist was prepped, draped, and anesthetized with 1% lidocaine. Using the modified Seldinger technique, a 6 French Slender sheath was introduced into the right radial artery. 3 mg of verapamil was administered through the sheath, weight-based unfractionated heparin was administered intravenously. A JL 3.5 catheter was used for left coronary angiography. A JR 4 guide was then used for RCA angiography and intervention. A pigtail catheter was used for left ventriculography at the end of PCI. Catheter exchanges were performed over an exchange length guidewire. There were no immediate procedural complications.  Procedural Findings:  Hemodynamics: AO:  111/68   mmHg LV:  115/12    mmHg LVEDP: 21  mmHg  Coronary angiography: Coronary dominance: Right   Left Main:  Normal in size with patent stent. There is 20% distal stenosis which  is unchanged.  Left Anterior Descending (LAD):  Moderately calcified throughout its course. There is diffuse 40% disease throughout the midsegment. The vessel is occluded distally with faint collaterals from the diagonal.  1st diagonal (D1):  Normal in size with 80% ostial stenosis.  2nd diagonal (D2):  Normal in size with 20% ostial stenosis.  3rd diagonal (D3):  Medium in size with no significant disease.  Circumflex (LCx):  Normal in size and nondominant. There is 30% mid disease.  Right Coronary Artery: Very large in size and dominant. Overlapped  noted extending from the ostium to the midsegment. The stents are occluded in the midsegment with large thrombus burden.  Posterior descending artery: Large in size with minor irregularities.  Posterior AV segment: Large in size with 40-50% disease in the midsegment.  Posterolateral branchs:  No significant disease.  Left ventriculography: Left ventricular systolic function is normal , LVEF is estimated at 55 %, there is no significant mitral regurgitation   PCI Note:  Following the diagnostic procedure, the decision was made to proceed with PCI.  I given additional heparin with a total of 12,000 units. ACT was 285. Once a therapeutic ACT was achieved, a 6 [place] JR 4 guide catheter was inserted.  A run through coronary guidewire was used to cross the lesion.  Aspiration thrombectomy was performed with an expressway catheter with 4 runs. We retrieved large amount of white thrombus. This established TIMI 3 flow. I then decided to perform balloon angioplasty with a 4.0 X20 mm noncompliant balloon. I could not advance the balloon in the midsegment due to calcifications. Thus, I used a BMW wire as a buddy. The balloon was then advanced with multiple  inflations done to a maximum of 16 atmospheres throughout the stents. There was mild residual thrombus most notable in the proximal area. I decided to start treatment with Integrilin. The patient was also  given 60 mg of Effient. I decided not to add any stents given that this was likely due to interruption of dual antiplatelet therapy in the setting of long overlapped drug-eluting stents in relatively heavily calcified vessel. Following PCI, there was 20% residual stenosis and TIMI-3 flow. Final angiography confirmed very good result. The patient tolerated the procedure well. There were no immediate procedural complications.   PCI Data: Vessel - proximal and mid RCA/Segment - 1 and 2 Percent Stenosis (pre)  100% TIMI-flow 0 Stent : No stent. Only aspiration thrombectomy and balloon angioplasty. Percent Stenosis (post) 20% TIMI-flow (post) 0   Final Conclusions:  1. Acute inferior ST elevation myocardial infarction due to late stent thrombosis involving the right coronary artery in the setting of interruption of dual antiplatelet therapy. Continued patency of the left main stent. 2. Normal LV systolic function with mildly elevated left ventricular end-diastolic pressure. 3. Successful aspiration thrombectomy and balloon angioplasty to the right coronary artery. There was mild residual thrombus which was left to be treated medically.   Recommendations:  I recommend lifelong dual antiplatelet therapy. I had a prolonged discussion with him about the importance of compliance. Continue Integrilin for 18 hours.  Lorine Bears MD, [facility] 06/22/2012, 6:14 PM

## 2012-06-22 NOTE — Progress Notes (Addendum)
Right Radial Radstat Arm band deflated per markings on band every 15 mins for a total of 5 markings. TRB post band removal followed closely. Site soft, no oozing, hematoma noted. Site covered with 2 x 2 and tegaderm. Right arm elevated on pillow and pt aware to keep it elevated x24 h. Will cont to monitor closely.  Critical Troponin (1.45) called to this RN, MD not notified as it was post cath.

## 2012-06-22 NOTE — H&P (Signed)
Patient ID: [name] MRN: [id number], DOB/AGE: [birth year]   Admit date:06/22/2012  Primary Physician: Barbette Reichmann, MD Primary Cardiologist: Judie Petit. Kirke Corin, MD   Pt. Profile:  67 y/o male with h/o CAD s/p recent NSTEMI and DES x 2 to the RCA in 04/2012, who returns with acute inf STEMI in the setting of being off of effient for at least the past 2 days.  Problem List  Past Medical History  Diagnosis Date  . Hypertension   . Hyperlipidemia   . Morbid obesity   . NSVT (nonsustained ventricular tachycardia)     a. 04/2012 post MI; normal LVF   . Physical deconditioning     requiring outpatient PT  . Osteoarthritis   . Morbid obesity   . Lower extremity cellulitis   . Peripheral edema   . Diabetes mellitus      Insulin-dependent  . Chronic knee pain   . Anemia, unspecified   . Ischemic cardiomyopathy     a. 4.2014 EF 40% by LV gram.  . Coronary artery disease     a. s/p anterior STEMI 07/13/10: tx with Promus DES to distal LM; s/p POBA to distal LAD;  b. 04/2012 NSTEMI/Cath/PCI: LM 20%, LAD 20% p, 48m, 100d, D1 60-70ost, D2 nl, D3 nl, LCX mid dzs, OM1/2/3 min irregs, RCA dom 60ost, 90p, 70m (3.5x38 & 4.0x24 Promus DES'), EF 40%, inf HK.    Past Surgical History  Procedure Laterality Date  . Coronary stent placement      drug-eluting to the distal left main and PTCA of the distal left anterior descending.  . Skin graft Right     history  . Knee surgery Left   . Cataract surgery    . Cardiac catheterization    . Cardiac catheterization      x2 stents;[facility]    Allergies  No Known Allergies  HPI  67 y/o male with h/o CAD s/p prior LM stenting in 06/2010 and more recently DES placement to the mid and prox RCA in the setting of a NSTEMI in 04/2012.  EF was 40% at that time.  Pt has a h/o of difficulty affording his medications, which has resulted in noncompliance in the past.  He was discharged in April with a 30 day Effient card and a separate Rx was e-prescribed to his  pharmacy.  He has been doing well post MI, however he says that he ran out of his Effient 2 days ago (30 day card should have run out ~ 13 days ago) and had not gotten around to his pharmacy to pick up refills yet.  Today at approximately 3:45 PM, he had sudden onset of chest pain and dyspnea that he immediately recognized as being similar to MI pain.  EMS was activated and upon their arrival ECG showed Inferior ST elevation.  A Code STEMI was activated.  Pt was treated with ntg w/o relief of Ss.  Upon arrival to the cath lab, he has persistent inferior ST elevation and 10/10 chest pain.  Emergent cath is ongoing.  Home Medications  Prior to Admission medications   Medication Sig Start Date End Date Taking? Authorizing Provider  acetaminophen (TYLENOL) 500 MG tablet Take 2 tablets (1,000 mg total) by mouth every 6 (six) hours as needed for pain (knee and chest pain). 05/10/12   Ok Anis, NP  aspirin 81 MG tablet Take 1 tablet (81 mg total) by mouth daily. 05/10/12   Ok Anis, NP  atorvastatin (LIPITOR) 20 MG tablet  Take 1 tablet (20 mg total) by mouth daily at 6 PM. 05/10/12   Ok Anis, NP  carvedilol (COREG) 3.125 MG tablet Take 1 tablet (3.125 mg total) by mouth 2 (two) times daily with a meal. 05/10/12   Ok Anis, NP  nitroGLYCERIN (NITROSTAT) 0.4 MG SL tablet Place 1 tablet (0.4 mg total) under the tongue every 5 (five) minutes as needed for chest pain. 05/10/12   Ok Anis, NP  prasugrel (EFFIENT) 10 MG TABS Take 1 tablet (10 mg total) by mouth daily. 05/10/12   Ok Anis, NP   Family History  Family History  Problem Relation Age of Onset  . Heart attack Brother 60    died  . Coronary artery disease      strongly positive family hx of  . Heart attack Father    Social History  History   Social History  . Marital Status: Married    Spouse Name: [name]    Number of Children: 2  . Years of Education: [name]   Occupational History    . Not on file.   Social History Main Topics  . Smoking status: Never Smoker   . Smokeless tobacco: Not on file  . Alcohol Use: No  . Drug Use: No  . Sexually Active: Not on file   Other Topics Concern  . Not on file   Social History Narrative  . No narrative on file    Review of Systems General:  No chills, fever, night sweats or weight changes.  Cardiovascular:  +++ chest pain and dyspnea at present.  Trace edema, No orthopnea, palpitations, paroxysmal nocturnal dyspnea. Dermatological: No rash, lesions/masses Respiratory: No cough, +++ dyspnea Urologic: No hematuria, dysuria Abdominal:   No nausea, vomiting, diarrhea, bright red blood per rectum, melena, or hematemesis Neurologic:  No visual changes, wkns, changes in mental status. All other systems reviewed and are otherwise negative except as noted above.  Physical Exam  AF, 61, 20, 109/66, 98%  2lpm  General: Pleasant, c/o chest and r knee pain. Psych: Normal affect. Neuro: Alert and oriented X 3. Moves all extremities spontaneously. HEENT: Normal  Neck: Supple without bruits or JVD. Lungs:  Resp regular and unlabored, CTA. Heart: RRR no s3, s4, or murmurs. Abdomen: Soft, non-tender, non-distended, BS + x 4.  Extremities: No clubbing, cyanosis.  Trace to 1+ bilat LE edema. DP/PT/Radials 1+ and equal bilaterally.  Labs  pending   Radiology/Studies  No results found.  ECG  RSR, 79, 1-96mm ST elevation II, III, aVF, st dep I, aVL.  ASSESSMENT AND PLAN  1.  Acute Inferior ST elevation MI/CAD:  S/p recent DES x 2 to the RCA with recent effient noncompliance.  He has ongoing chest pain and ST elevation and is undergoing emergent cath at this time.  Plan to resume asa, effient, statin, bb, arb.  He will require extensive education regarding the importance of compliance.  2.  IDDM:  Cont insulin.  Add SSI.  3.  HTN:  Currently stable - bb/arb.  4.  HL:  Cont high potency statin.  5.  ICM:  EF 40% by LV gram  in April.  Appear euvolemic.  Cont bb/arb/diuretics.  Signed, Nicolasa Ducking, NP 06/22/2012, 4:41 PM

## 2012-06-23 ENCOUNTER — Encounter (HOSPITAL_COMMUNITY): Payer: Self-pay | Admitting: *Deleted

## 2012-06-23 DIAGNOSIS — E785 Hyperlipidemia, unspecified: Secondary | ICD-10-CM

## 2012-06-23 DIAGNOSIS — R072 Precordial pain: Secondary | ICD-10-CM

## 2012-06-23 DIAGNOSIS — I2119 ST elevation (STEMI) myocardial infarction involving other coronary artery of inferior wall: Secondary | ICD-10-CM

## 2012-06-23 DIAGNOSIS — I10 Essential (primary) hypertension: Secondary | ICD-10-CM

## 2012-06-23 LAB — POCT I-STAT, CHEM 8
BUN: 13 mg/dL (ref 6–23)
Calcium, Ion: 1.12 mmol/L — ABNORMAL LOW (ref 1.13–1.30)
Chloride: 103 mEq/L (ref 96–112)
Glucose, Bld: 258 mg/dL — ABNORMAL HIGH (ref 70–99)
HCT: 47 % (ref 39.0–52.0)
TCO2: 22 mmol/L (ref 0–100)

## 2012-06-23 LAB — GLUCOSE, CAPILLARY
Glucose-Capillary: 286 mg/dL — ABNORMAL HIGH (ref 70–99)
Glucose-Capillary: 113 mg/dL — ABNORMAL HIGH (ref 70–99)
Glucose-Capillary: 113 mg/dL — ABNORMAL HIGH (ref 70–99)

## 2012-06-23 LAB — BASIC METABOLIC PANEL
CO2: 27 mEq/L (ref 19–32)
Calcium: 8.7 mg/dL (ref 8.4–10.5)
GFR calc non Af Amer: >90 mL/min (ref >90)
Glucose, Bld: 151 mg/dL — ABNORMAL HIGH (ref 70–99)
Potassium: 4.1 mEq/L (ref 3.5–5.1)
Sodium: 139 mEq/L (ref 135–145)

## 2012-06-23 LAB — LIPID PANEL
HDL: 32 mg/dL — ABNORMAL LOW (ref >39)
Total CHOL/HDL Ratio: 4.4 RATIO
VLDL: 22 mg/dL (ref 0–40)

## 2012-06-23 LAB — CBC
Hemoglobin: 14.3 g/dL (ref 13.0–17.0)
MCH: 26 pg (ref 26.0–34.0)
Platelets: 170 10*3/uL (ref 150–400)
RBC: 5.5 MIL/uL (ref 4.22–5.81)
WBC: 9 10*3/uL (ref 4.0–10.5)

## 2012-06-23 LAB — TROPONIN I
Troponin I: 19.86 ng/mL (ref <0.30)
Troponin I: 14.3 ng/mL (ref <0.30)

## 2012-06-23 MED ORDER — ACETAMINOPHEN 500 MG PO TABS: 500.0000 mg | ORAL_TABLET | Freq: Four times a day (QID) | ORAL | Status: DC | PRN

## 2012-06-23 MED ORDER — ACETAMINOPHEN 500 MG PO TABS: 1000.0000 mg | ORAL_TABLET | Freq: Four times a day (QID) | ORAL | Status: DC | PRN

## 2012-06-23 MED ORDER — PERFLUTREN LIPID MICROSPHERE
1.0000 mL | INTRAVENOUS | Status: AC | PRN
  Filled 2012-06-23: qty 10

## 2012-06-23 MED ORDER — HYDROCORTISONE 1 % EX CREA
TOPICAL_CREAM | CUTANEOUS | Status: DC | PRN
  Filled 2012-06-23: qty 28

## 2012-06-23 MED ORDER — PERFLUTREN LIPID MICROSPHERE
INTRAVENOUS | Status: AC
  Administered 2012-06-23: 14:00:00
  Filled 2012-06-23: qty 10

## 2012-06-23 MED FILL — Sodium Chloride IV Soln 0.9%: INTRAVENOUS | Qty: 50 | Status: AC

## 2012-06-23 NOTE — Progress Notes (Signed)
CARDIAC REHAB PHASE I   PRE:  Rate/Rhythm: 70 SR    BP: sitting 116/64    SaO2: 96 RA  MODE:  Ambulation: 144 ft   POST:  Rate/Rhythm: 103 ST    BP: sitting 136/60     SaO2: 97 RA  Assisted pt to BR and then ambulated 144 ft assist x2 with supervision and RW. Tolerated fairly well. Sts he was SOB after walk but it felt good to walk. Walked further distance than previous admission (80 ft with x4 rests). To bed with head of bed up. Very motivated. [id number]   Elissa Lovett [place] CES, ACSM 06/23/2012 3:12 PM

## 2012-06-23 NOTE — Progress Notes (Signed)
Second set of Troponin post cath called to MD (Dr. Terressa Koyanagi). Trop = 19.86. Pt asleep with no complaints. No new order received. Will cont to monitor.

## 2012-06-23 NOTE — Progress Notes (Signed)
Subjective:  The patient was admitted yesterday with in-stent thrombosis of recent DES to RCA.  This morning, the patient notes no cp, SOB, palpitations.  Objective:  Vital Signs in the last 24 hours: Temp:  [97.6 F (36.4 C)-98.2 F (36.8 C)] 98.2 F (36.8 C) (05/29 0400) Pulse Rate:  [58-75] 58 (05/29 0500) Resp:  [11-27] 18 (05/29 0600) BP: (91-164)/(33-77) 121/49 mmHg (05/29 0600) SpO2:  [95 %-100 %] 97 % (05/29 0600) Weight:  [378 lb (171.46 kg)-385 lb 12.9 oz (175 kg)] 378 lb (171.46 kg) (05/29 0500)  Intake/Output from previous day: 05/28 0701 - 05/29 0700 In: 1920 [P.O.:540; I.V.:1380] Out: 1450 [Urine:1450]  Physical Exam: General: alert, cooperative, and in no apparent distress HEENT: pupils equal round and reactive to light, vision grossly intact, oropharynx clear and non-erythematous  Neck: supple, no lymphadenopathy, no JVD Lungs: clear to ascultation bilaterally, normal work of respiration, no wheezes, rales, ronchi Heart: regular rate and rhythm, no murmurs, gallops, or rubs Abdomen: soft, non-tender, non-distended, normal bowel sounds Extremities: bilateral non-pitting edema Neurologic: alert & oriented X3, cranial nerves II-XII intact, strength grossly intact, sensation intact to light touch  Lab Results:  Recent Labs  06/22/12 1900 06/23/12 0644  WBC 13.1* 9.0  HGB 15.0 14.3  PLT 182 170    Recent Labs  06/22/12 1900  NA 135  K 4.1  CL 100  CO2 20  GLUCOSE 270*  BUN 13  CREATININE 0.82    Recent Labs  06/22/12 1825 06/23/12 0023  TROPONINI 1.45* 19.86*    Cardiac Studies: AO: 111/68 mmHg  LV: 115/12 mmHg  LVEDP: 21 mmHg  Coronary angiography:  Coronary dominance: Right  Left Main: Normal in size with patent stent. There is 20% distal stenosis which is unchanged.  Left Anterior Descending (LAD): Moderately calcified throughout its course. There is diffuse 40% disease throughout the midsegment. The vessel is occluded distally  with faint collaterals from the diagonal.  1st diagonal (D1): Normal in size with 80% ostial stenosis.  2nd diagonal (D2): Normal in size with 20% ostial stenosis.  3rd diagonal (D3): Medium in size with no significant disease.  Circumflex (LCx): Normal in size and nondominant. There is 30% mid disease.  Right Coronary Artery: Very large in size and dominant. Overlapped  noted extending from the ostium to the midsegment. The stents are occluded in the midsegment with large thrombus burden.  Posterior descending artery: Large in size with minor irregularities.  Posterior AV segment: Large in size with 40-50% disease in the midsegment.  Posterolateral branchs: No significant disease. Left ventriculography: Left ventricular systolic function is normal , LVEF is estimated at 55 %, there is no significant mitral regurgitation  PCI Note: Following the diagnostic procedure, the decision was made to proceed with PCI. I given additional heparin with a total of 12,000 units. ACT was 285. Once a therapeutic ACT was achieved, a 6 [place] JR 4 guide catheter was inserted. A run through coronary guidewire was used to cross the lesion. Aspiration thrombectomy was performed with an expressway catheter with 4 runs. We retrieved large amount of white thrombus. This established TIMI 3 flow. I then decided to perform balloon angioplasty with a 4.0 X20 mm noncompliant balloon. I could not advance the balloon in the midsegment due to calcifications. Thus, I used a BMW wire as a buddy. The balloon was then advanced with multiple inflations done to a maximum of 16 atmospheres throughout the stents. There was mild residual thrombus most notable in the  proximal area. I decided to start treatment with Integrilin. The patient was also given 60 mg of Effient. I decided not to add any stents given that this was likely due to interruption of dual antiplatelet therapy in the setting of long overlapped drug-eluting stents in relatively  heavily calcified vessel. Following PCI, there was 20% residual stenosis and TIMI-3 flow. Final angiography confirmed very good result. The patient tolerated the procedure well. There were no immediate procedural complications.  PCI Data:  Vessel - proximal and mid RCA/Segment - 1 and 2  Percent Stenosis (pre) 100%  TIMI-flow 0  Stent : No stent. Only aspiration thrombectomy and balloon angioplasty.  Percent Stenosis (post) 20%  TIMI-flow (post) 0  Final Conclusions:  1. Acute inferior ST elevation myocardial infarction due to late stent thrombosis involving the right coronary artery in the setting of interruption of dual antiplatelet therapy. Continued patency of the left main stent.  2. Normal LV systolic function with mildly elevated left ventricular end-diastolic pressure.  3. Successful aspiration thrombectomy and balloon angioplasty to the right coronary artery. There was mild residual thrombus which was left to be treated medically.  Tele: NSR  Assessment/Plan:  The patient is a 67 yo man, history of CAD s/p prior stents, presenting with late stent thrombosis after DES to RCA 05/09/12, s/p aspiration thrombectomy and balloon angioplasty to RCA.  # STEMI - due to late in-stent thrombosis after DES to RCA 05/09/12, in the setting of medication non-compliance.  The patient is s/p cardiac cath 5/28, with aspiration thrombectomy and balloon angioplasty to RCA.  The patient now notes resolution of chest pain. -continue aspirin, effient -continue coreg, losartan -continue atorvastatin -stress importance of medication compliance  # HTN - chronic, stable -continue coreg, losartan  # DM2 - last A1C = 9.8 (06/2010 -continue NPH and SSI  # Hyperlipidemia - chronic, stable -continue atorvastatin  Janalyn Harder, M.D. 06/23/2012, 7:24 AM    Patient seen and examined and history reviewed. Agree with above findings and plan. Patient presented with acute inferior STEMI last night about 6 weeks  post extensive stenting of the RCA. He had thrombosis and occlusion of the RCA stents treated with POBA and thrombus aspiration. Vessel is large. Also has prior stent of left main. Troponins are significantly elevated. Ecg shows resolution of ST elevation but Q waves inferiorly. Will get Echo to reassess LV function. Continue Integrelin for 18 hours. Reinforced compliance with dual antiplatelet therapy. Also on beta blocker, ARB, and high dose statin.  Theron Arista [facility] 06/23/2012 8:20 AM

## 2012-06-23 NOTE — Progress Notes (Signed)
ANTICOAGULATION CONSULT NOTE   Pharmacy Consult for Integrelin Indication: Post cath coverage  No Known Allergies  Patient Measurements: Height: 6' (182.9 cm) Weight: 378 lb (171.46 kg) IBW/kg (Calculated) : 77.6  Vital Signs: Temp: 98.6 F (37 C) (05/29 0737) Temp src: Oral (05/29 0737) BP: 147/58 mmHg (05/29 0800) Pulse Rate: 54 (05/29 0800)  Labs:  Recent Labs  06/22/12 1825 06/22/12 1900 06/23/12 0023 06/23/12 0644  HGB  --  15.0  --  14.3  HCT  --  43.8  --  43.8  PLT  --  182  --  170  CREATININE  --  0.82  --  0.75  TROPONINI 1.45*  --  19.86* 14.30*    Estimated Creatinine Clearance: 148 ml/min (by C-G formula based on Cr of 0.75).  Assessment: 67 year old man, 171 kg to be on Integrelin x 18 hours after cath for antiplatelet coverage.  Scr 0.75 this am. Plt down just slightly 182->170 and hgb also slight trend down to 14.3. No bleeding issues, oozing, hematoma noted.  Plan:  Continue integrilin at  15mg /hr(max dose), end time of 1200 this morning noted.  Sheppard Coil PharmD., BCPS Clinical Pharmacist Pager [phone] 06/23/2012 10:29 AM

## 2012-06-24 DIAGNOSIS — I2510 Atherosclerotic heart disease of native coronary artery without angina pectoris: Secondary | ICD-10-CM

## 2012-06-24 LAB — BASIC METABOLIC PANEL
BUN: 9 mg/dL (ref 6–23)
Chloride: 103 mEq/L (ref 96–112)
GFR calc Af Amer: >90 mL/min (ref >90)
GFR calc non Af Amer: >90 mL/min (ref >90)
Glucose, Bld: 166 mg/dL — ABNORMAL HIGH (ref 70–99)
Potassium: 3.4 mEq/L — ABNORMAL LOW (ref 3.5–5.1)
Sodium: 138 mEq/L (ref 135–145)

## 2012-06-24 LAB — GLUCOSE, CAPILLARY
Glucose-Capillary: 94 mg/dL (ref 70–99)
Glucose-Capillary: 128 mg/dL — ABNORMAL HIGH (ref 70–99)
Glucose-Capillary: 105 mg/dL — ABNORMAL HIGH (ref 70–99)

## 2012-06-24 LAB — CBC
HCT: 42.2 % (ref 39.0–52.0)
Hemoglobin: 14.2 g/dL (ref 13.0–17.0)
MCHC: 33.6 g/dL (ref 30.0–36.0)
RDW: 13.9 % (ref 11.5–15.5)
WBC: 7.3 10*3/uL (ref 4.0–10.5)

## 2012-06-24 LAB — HEMOGLOBIN A1C: Mean Plasma Glucose: 183 mg/dL — ABNORMAL HIGH (ref <117)

## 2012-06-24 NOTE — Progress Notes (Signed)
CARDIAC REHAB PHASE I   PRE:  Rate/Rhythm: 88 SR    BP: sitting 97/58    SaO2: 94 RA  MODE:  Ambulation: 150 ft   POST:  Rate/Rhythm: 117 ST    BP: sitting 110/66     SaO2: 98 RA  Pt anxious to walk and to do well. Pushed himself, walking fast. Encouraged rest but pt only took x1 rest break toward end. After walk exhausted and c/o significant dizziness on EOB. Dizziness lasted atleast 8 min (left pt in RN's care). BP stable. HR had increased to 117 ST walking but around 102 ST when c/o dizziness.  Sts "I just can't shake it". Will f/u for education. [id number]  Harriet Masson CES, ACSM 06/24/2012 9:36 AM

## 2012-06-24 NOTE — Progress Notes (Signed)
Subjective:  The patient notes no chest pain, palpitations, or SOB this morning.  His troponins peaked at 19.86 yesterday, then downtrended.  The patient asks about how to get effient when he leaves the hospital.  Objective:  Vital Signs in the last 24 hours: Temp:  [97.5 F (36.4 C)-98.9 F (37.2 C)] 97.6 F (36.4 C) (05/30 0600) Pulse Rate:  [54-70] 65 (05/30 0600) Resp:  [13-25] 23 (05/30 0600) BP: (111-147)/(40-69) 145/56 mmHg (05/30 0600) SpO2:  [90 %-99 %] 95 % (05/30 0600) Weight:  [384 lb 4.2 oz (174.3 kg)] 384 lb 4.2 oz (174.3 kg) (05/30 0500)  Intake/Output from previous day: 05/29 0701 - 05/30 0700 In: 1980 [P.O.:1440; I.V.:540] Out: 1675 [Urine:1675]  Physical Exam: General: alert, cooperative, and in no apparent distress HEENT: pupils equal round and reactive to light, vision grossly intact, oropharynx clear and non-erythematous  Neck: supple, no lymphadenopathy, no JVD Lungs: clear to ascultation bilaterally, normal work of respiration, no wheezes, rales, ronchi Heart: regular rate and rhythm, no murmurs, gallops, or rubs Abdomen: soft, non-tender, non-distended, normal bowel sounds Extremities: bilateral non-pitting edema Neurologic: alert & oriented X3, cranial nerves II-XII intact, strength grossly intact, sensation intact to light touch  Lab Results:  Recent Labs  06/22/12 1900 06/23/12 0644  WBC 13.1* 9.0  HGB 15.0 14.3  PLT 182 170    Recent Labs  06/22/12 1900 06/23/12 0644  NA 135 139  K 4.1 4.1  CL 100 103  CO2 20 27  GLUCOSE 270* 151*  BUN 13 9  CREATININE 0.82 0.75    Recent Labs  06/23/12 0023 06/23/12 0644  TROPONINI 19.86* 14.30*    Cardiac Studies: AO: 111/68 mmHg  LV: 115/12 mmHg  LVEDP: 21 mmHg  Coronary angiography:  Coronary dominance: Right  Left Main: Normal in size with patent stent. There is 20% distal stenosis which is unchanged.  Left Anterior Descending (LAD): Moderately calcified throughout its course.  There is diffuse 40% disease throughout the midsegment. The vessel is occluded distally with faint collaterals from the diagonal.  1st diagonal (D1): Normal in size with 80% ostial stenosis.  2nd diagonal (D2): Normal in size with 20% ostial stenosis.  3rd diagonal (D3): Medium in size with no significant disease.  Circumflex (LCx): Normal in size and nondominant. There is 30% mid disease.  Right Coronary Artery: Very large in size and dominant. Overlapped  noted extending from the ostium to the midsegment. The stents are occluded in the midsegment with large thrombus burden.  Posterior descending artery: Large in size with minor irregularities.  Posterior AV segment: Large in size with 40-50% disease in the midsegment.  Posterolateral branchs: No significant disease. Left ventriculography: Left ventricular systolic function is normal , LVEF is estimated at 55 %, there is no significant mitral regurgitation  PCI Note: Following the diagnostic procedure, the decision was made to proceed with PCI. I given additional heparin with a total of 12,000 units. ACT was 285. Once a therapeutic ACT was achieved, a 6 [place] JR 4 guide catheter was inserted. A run through coronary guidewire was used to cross the lesion. Aspiration thrombectomy was performed with an expressway catheter with 4 runs. We retrieved large amount of white thrombus. This established TIMI 3 flow. I then decided to perform balloon angioplasty with a 4.0 X20 mm noncompliant balloon. I could not advance the balloon in the midsegment due to calcifications. Thus, I used a BMW wire as a buddy. The balloon was then advanced with multiple inflations  done to a maximum of 16 atmospheres throughout the stents. There was mild residual thrombus most notable in the proximal area. I decided to start treatment with Integrilin. The patient was also given 60 mg of Effient. I decided not to add any stents given that this was likely due to interruption of dual  antiplatelet therapy in the setting of long overlapped drug-eluting stents in relatively heavily calcified vessel. Following PCI, there was 20% residual stenosis and TIMI-3 flow. Final angiography confirmed very good result. The patient tolerated the procedure well. There were no immediate procedural complications.  PCI Data:  Vessel - proximal and mid RCA/Segment - 1 and 2  Percent Stenosis (pre) 100%  TIMI-flow 0  Stent : No stent. Only aspiration thrombectomy and balloon angioplasty.  Percent Stenosis (post) 20%  TIMI-flow (post) 0  Final Conclusions:  1. Acute inferior ST elevation myocardial infarction due to late stent thrombosis involving the right coronary artery in the setting of interruption of dual antiplatelet therapy. Continued patency of the left main stent.  2. Normal LV systolic function with mildly elevated left ventricular end-diastolic pressure.  3. Successful aspiration thrombectomy and balloon angioplasty to the right coronary artery. There was mild residual thrombus which was left to be treated medically.  Echocardiogram 06/23/12 - Left ventricle: The images are severely limited. The contrast images help some. My guess for EF would be 45%. Images were inadequate for LV wall motion assessment. - Right ventricle: Not able to assess RV function due to poor visualization. Not able to assess RV size due to poor visualization. I think there is RV dilitation and RV dysfunction.  Tele: NSR  Assessment/Plan:  The patient is a 67 yo man, history of CAD s/p prior stents, presenting with late stent thrombosis after DES to RCA 05/09/12, s/p aspiration thrombectomy and balloon angioplasty to RCA.  # STEMI - due to  in-stent thrombosis after DES to RCA 05/09/12, in the setting of medication non-compliance.  The patient is s/p cardiac cath 5/28, with aspiration thrombectomy and balloon angioplasty to RCA.  The patient now notes resolution of chest pain. -continue aspirin,  effient -continue coreg, losartan -continue atorvastatin -stressed importance of medication compliance  Patietn appears to understand -will involve Case Management to see if patient qualifies for the [facility] program for Effient  # HTN - chronic, stable -continue coreg, losartan  # DM2 - last A1C = 9.8  -continue NPH and SSI  # Hyperlipidemia - chronic, stable -continue atorvastatin  # Dispo - transfer to telemetry  Ambulate today  Possible d/c tomorrow  Janalyn Harder, M.D. 06/24/2012, 6:37 AM   Patient seen and examined.  Agree with findings of R Manson Passey as noted above.  I have amended note to reflect my findings.

## 2012-06-24 NOTE — Progress Notes (Signed)
Spoke with pt re ed. Pt struggled to recall ed done from April. Reviewed MI, stent, antiplatelet, NTG, diet and stress. Pts main focus is his financial stress. He is fixated on it, it keeps him up at night. Sts he will prioritize antiplatelet now. Encouraged pt to prioritize himself and his health, to keep walking and to eat right since he has lost >100 lbs already. In general pt is not very receptive to education.  [id number] Ethelda Chick CES, ACSM 12:32 PM 06/24/2012

## 2012-06-24 NOTE — Progress Notes (Signed)
Brief Interval Progress Note  I was called by the patient's nurse around 10 am for lightheadedness.  Upon further questioning of the patient, he notes that after walking with cardiac rehab, he experienced an episode of lightheadedness, accompanied by left and right lateral neck "pounding" and numbness.  He noted no chest pain, chest numbness, SOB, nausea, or diaphoresis.  Symptoms completely resolved within about 15 minutes, and he is asymptomatic now.  EKG at the time showed no acute ST changes, and only showed the old inferior changes seen on prior EKG.  Troponin was checked, and has continued to downtrend to 4.1.  We will continue to monitor the patient's symptoms, no further intervention needed at this time.  SignedJanalyn Harder, PGY2 06/24/2012, 11:52 AM

## 2012-06-24 NOTE — Care Management Note (Addendum)
    Page 1 of 1   06/24/2012     4:08:56 PM   CARE MANAGEMENT NOTE 06/24/2012  Patient:  [name], [name]   Account Number:  [id number]  Date Initiated:  06/23/2012  Documentation initiated by:  Junius Creamer  Subjective/Objective Assessment:   adm w mi     Action/Plan:   lives w wife, pcp dr h vishanath   Anticipated DC Date:     Anticipated DC Plan:        DC Planning Services  CM consult      Choice offered to / List presented to:             Status of service:   Medicare Important Message given?   (If response is "NO", the following Medicare IM given date fields will be blank) Date Medicare IM given:   Date Additional Medicare IM given:    Discharge Disposition:    Per UR Regulation:  Reviewed for med. necessity/level of care/duration of stay  If discussed at Long Length of Stay Meetings, dates discussed:    Comments:  06/24/12 Rosalita Chessman [phone] SPOKE WITH SHARON FROM [facility] IN [place]. PT NEEDS TO COME BY OFFICE IN [place] TO PICK UP SAMPLES AND COMPLETE EFFIENT ASSISTANCE FORM.  RELAYED MESSAGE TO PT/WIFE.  THEY VERBALIZED UNDERSTANDING.  5/30 0925 debbie dowell rn,bsn left pt pt assist form for pt's w medicare d to see if company can help pt w effient since he spends all his money on care of his wife. he has 40.00 per month copay.  5/29 0941 debbie dowell rn,bsn left pt effient 30dayand copay assist card in room. per cm sec pt has 40.00 per month copay w no prior auth req.

## 2012-06-24 NOTE — Progress Notes (Signed)
Patient complained of chest numbness radiating up both sides of neck along with dizziness after walking with cardiac rehab.  Stat EKG ordered and completed.  Dr. Manson Passey notified.  Will continue to monitor pt closely.

## 2012-06-25 ENCOUNTER — Encounter (HOSPITAL_COMMUNITY): Payer: Self-pay | Admitting: Physician Assistant

## 2012-06-25 LAB — GLUCOSE, CAPILLARY
Glucose-Capillary: 81 mg/dL (ref 70–99)
Glucose-Capillary: 133 mg/dL — ABNORMAL HIGH (ref 70–99)

## 2012-06-25 MED ORDER — POTASSIUM CHLORIDE CRYS ER 20 MEQ PO TBCR
40.0000 meq | EXTENDED_RELEASE_TABLET | Freq: Once | ORAL | Status: AC
  Administered 2012-06-25: 40 meq via ORAL
  Filled 2012-06-25: qty 2

## 2012-06-25 MED ORDER — ATORVASTATIN CALCIUM 80 MG PO TABS: 80.0000 mg | ORAL_TABLET | Freq: Every day | ORAL | Status: DC

## 2012-06-25 MED ORDER — CARVEDILOL 6.25 MG PO TABS: 6.2500 mg | ORAL_TABLET | Freq: Two times a day (BID) | ORAL | Status: DC

## 2012-06-25 MED ORDER — NITROGLYCERIN 0.4 MG SL SUBL: 0.4000 mg | SUBLINGUAL_TABLET | SUBLINGUAL | Status: AC | PRN

## 2012-06-25 MED ORDER — LOSARTAN POTASSIUM 25 MG PO TABS: 25.0000 mg | ORAL_TABLET | Freq: Every day | ORAL | Status: DC

## 2012-06-25 MED ORDER — PRASUGREL HCL 10 MG PO TABS: 10.0000 mg | ORAL_TABLET | Freq: Every day | ORAL | Status: DC

## 2012-06-25 MED ORDER — CARVEDILOL 6.25 MG PO TABS
6.2500 mg | ORAL_TABLET | Freq: Two times a day (BID) | ORAL | Status: DC
  Administered 2012-06-25: 6.25 mg via ORAL
  Filled 2012-06-25 (×2): qty 1

## 2012-06-25 NOTE — Discharge Summary (Signed)
Discharge Summary   Patient ID: [name] MRN: [id number], DOB/AGE: November 07, 1945 67 y.o. Admit date: 06/22/2012 D/C date:     06/25/2012  Primary Cardiologist: Kirke Corin  Primary Discharge Diagnoses:  1. CAD - recurrent inferior STEMI secondary to late stent thrombosis due to Effient noncompliance s/p aspiration thrombectomy and balloon angioplasty - prior history: a. s/p anterior STEMI 07/13/10: tx with Promus DES to distal LM; s/p POBA to distal LAD;  b. 04/2012 NSTEMI/Cath/PCI: LM 20%, LAD 20% p, 27m, 100d, D1 60-70ost, D2 nl, D3 nl, LCX mid dzs, OM1/2/3 min irregs, RCA dom 60ost, 90p, 46m (3.5x38 & 4.0x24 Promus DES'), EF 40%, inf HK 2. HTN 3. DM2 4. Hyperlipidemia 5. Ischemic cardiomyopathy  Secondary Discharge Diagnoses:   1. Morbid obesity 2. NSVT 04/2012 post-MI, normal EF 3. Physical deconditioning 4. Osteoarthritis 5. Peripheral edema 6. LE cellulitis 7. Chronic knee pain 8. Anemia unspecified  Hospital Course: [name] is a 67 y/o M with history of HTN, HL, ICM, CAD s/p recent NSTEMI and DES x 2 to the RCA in 04/2012, who returns with acute inferior STEMI in the setting of being off of Effient for at least 2 days. He has a h/o of difficulty affording his medications, which has resulted in noncompliance in the past. He was discharged in April with an initial 30-day-free Effient card and a separate Rx was e-prescribed to his pharmacy. He stated that he ran out of his Effient 2 days prior to admission and had not gotten around to his pharmacy to pick up refills yet. (On day of discharge, he actually states he does not recall how he obtained his Effient - he does not recall using the 30-day-free card and thinks he paid for it himself.) On day of admission he developed chest pain similar to prior angina and EMS was called. Code STEMI was activated due to inferior STEMI. Cardiac cath demonstrated: Final Conclusions:  1. Acute inferior ST elevation myocardial infarction due to late stent  thrombosis involving the right coronary artery in the setting of interruption of dual antiplatelet therapy. Continued patency of the left main stent.  2. Normal LV systolic function with mildly elevated left ventricular end-diastolic pressure.  He was treated with successful aspiration thrombectomy and balloon angioplasty to the right coronary artery. There was mild residual thrombus which was left to be treated medically. Lifelong DAPT was recommended. He was treated with integrillin post-procedure. Post-procedurally he did develop some chest pain but remained stable. EKG showed resolution of ST elevation but Q waves inferiorly. 2D echocardiogram showed EF 45% but poor images. On day of discharge he is feeling well. Dr. Shirlee Latch has seen and examined him and feels he is stable for discharge. Compliance was strongly recommended.  Care management again saw him this admission and he is working on sending in paperwork to his Medicare to request cheaper Effient. They spoke with our [facility] who apparently has samples in stock. Unfortunately he is being discharged on Saturday so this office is not open. He truly feels that he never used the 30-day free card last month - he is not eligible to use it more than once, so if he realizes he did use it, I asked him if he would be willing to pay for the prescription in the meantime until he can contact our office for samples. He assured me he would. I reinforced the grave dangerous of going without his medication including repeat MI/death and he verbalized understanding. He has the 30-day free card  in his possession from care management now. He knows to call the office on Monday to inquire about samples.  He was again given a separate 30-day rx to use with his free card. I printed out his prescriptions this admission because he usually uses a mail order pharmacy.This is to ensure there is no delay on obtaining at least some of the medication. I have left a message on  our office's scheduling voicemail [facility] voicemail since [facility] does not have one) requesting a follow-up appointment, and our office will call the patient with this appointment.   Discharge Vitals: Blood pressure 136/58, pulse 66, temperature 97.4 F (36.3 C), temperature source Oral, resp. rate 16, height 6' (1.829 m), weight 384 lb 4.2 oz (174.3 kg), SpO2 95.00%.  Labs: Lab Results  Component Value Date   WBC 7.3 06/24/2012   HGB 14.2 06/24/2012   HCT 42.2 06/24/2012   MCV 79.0 06/24/2012   PLT 172 06/24/2012    Recent Labs Lab 06/22/12 1900  06/24/12 1042  NA 135  < > 138  K 4.1  < > 3.4*  CL 100  < > 103  CO2 20  < > 27  BUN 13  < > 9  CREATININE 0.82  < > 0.74  CALCIUM 8.8  < > 8.6  PROT 6.1  --   --   BILITOT 0.8  --   --   ALKPHOS 81  --   --   ALT 12  --   --   AST 43*  --   --   GLUCOSE 270*  < > 166*  < > = values in this interval not displayed.  Recent Labs  06/22/12 1825 06/23/12 0023 06/23/12 0644 06/24/12 1043  TROPONINI 1.45* 19.86* 14.30* 4.10*   Lab Results  Component Value Date   CHOL 140 06/23/2012   HDL 32* 06/23/2012   LDLCALC 86 06/23/2012   TRIG 108 06/23/2012    Diagnostic Studies/Procedures   2D Echo 06/23/12 - Left ventricle: The images are severely limited. The contrast images help some. My guess for EF would be 45%. Images were inadequate for LV wall motion assessment. - Right ventricle: Not able to assess RV function due to poor visualization. Not able to assess RV size due to poor visualization. I think there is RV dilitation and RV dysfunction.   Cardiac catheterization this admission, please see full report and above for summary.   Discharge Medications     Medication List    TAKE these medications       acetaminophen 500 MG tablet  Commonly known as:  TYLENOL  Take 2 tablets (1,000 mg total) by mouth every 6 (six) hours as needed for pain (knee and chest pain).     aspirin 81 MG tablet  Take 1 tablet (81 mg  total) by mouth daily.     atorvastatin 80 MG tablet  Commonly known as:  LIPITOR  Take 1 tablet (80 mg total) by mouth daily.     betamethasone valerate ointment 0.1 %  Commonly known as:  VALISONE  Apply 1 application topically 2 (two) times daily as needed (for excema).     carvedilol 6.25 MG tablet  Commonly known as:  COREG  Take 1 tablet (6.25 mg total) by mouth 2 (two) times daily with a meal.     glyBURIDE-metformin 2.5-500 MG per tablet  Commonly known as:  GLUCOVANCE  Take 2 tablets by mouth 2 (two) times daily with a meal.  insulin NPH 100 UNIT/ML injection  Commonly known as:  HUMULIN N,NOVOLIN N  Inject 50 Units into the skin 2 (two) times daily.     losartan 25 MG tablet  Commonly known as:  COZAAR  Take 1 tablet (25 mg total) by mouth daily.     nitroGLYCERIN 0.4 MG SL tablet  Commonly known as:  NITROSTAT  Place 1 tablet (0.4 mg total) under the tongue every 5 (five) minutes as needed for chest pain (up to 3 doses).     prasugrel 10 MG Tabs  Commonly known as:  EFFIENT  Take 1 tablet (10 mg total) by mouth daily.        Disposition   The patient will be discharged in stable condition to home. Discharge Orders   Future Appointments Provider Department Dept Phone   08/15/2012 2:00 PM Iran Ouch, MD Selena Batten at [facility] [phone]   Future Orders Complete By Expires     Diet - low sodium heart healthy  As directed     Discharge instructions  As directed     Comments:      One of your heart tests showed weakness of the heart muscle this admission. This may make you more susceptible to weight gain from fluid retention, which can lead to symptoms that we call heart failure. Follow these special instructions:  1. Follow a low-salt diet and watch your fluid intake. In general, you should not be taking in more than 2 liters of fluid per day (no more than 8 glasses per day). Some patients are restricted to less than 1.5 liters of fluid per  day (no more than 6 glasses per day). This includes sources of water in foods like soup, coffee, tea, milk, etc. 2. Weigh yourself on the same scale at same time of day and keep a log. 3. Call your doctor: (Anytime you feel any of the following symptoms)  - 3-4 pound weight gain in 1-2 days or 2 pounds overnight  - Shortness of breath, with or without a dry hacking cough  - Swelling in the hands, feet or stomach  - If you have to sleep on extra pillows at night in order to breathe   IT IS IMPORTANT TO LET YOUR DOCTOR KNOW EARLY ON IF YOU ARE HAVING SYMPTOMS SO WE CAN HELP YOU!    Increase activity slowly  As directed     Comments:      No driving for 2 weeks. No lifting over 10 lbs for 4 weeks. No sexual activity for 4 weeks. You may not return to work until cleared by your cardiologist. Keep procedure site clean & dry. If you notice increased pain, swelling, bleeding or pus, call/return!  You may shower, but no soaking baths/hot tubs/pools for 1 week.      Follow-up Information   Follow up with Lorine Bears, MD. (Our office will call you for a follow-up appointment. Please call the office if you have not heard from [place] within 3 days.)    Contact information:   [address] [place] [address] [phone]         Duration of Discharge Encounter: Greater than 30 minutes including physician and PA time.  Signed, Ronie Spies PA-C 06/25/2012, 3:55 PM

## 2012-06-25 NOTE — Progress Notes (Signed)
NCM spoke to pt and states he has never received 30 days free of Effient. He has 30 day free trial card, he will take with Rx to his pharmacy. He stated he could afford his copay. Isidoro Donning RN CCM Case Mgmt phone [phone]

## 2012-06-25 NOTE — Progress Notes (Signed)
Pt discharged per MD order and protocol. Pt leaving with 30 day Effient card in hand and prescriptions. Discharge instructions reviewed with patient and all questions answered. Pt aware of all follow up appointments and the importance of taking Effient. All prescriptions given to patient.

## 2012-06-25 NOTE — Progress Notes (Signed)
CARDIAC REHAB PHASE I   PRE:  Rate/Rhythm: 65 SR  BP:  Supine: 126/56 Sitting:   Standing:    SaO2: 97% RA  MODE:  Ambulation: 300 ft   POST:  Rate/Rhythm: 91  BP:  Supine:   Sitting: 122/68  Standing:    SaO2: 98% RA  1135-1154 Pt tolerated ambulation fair with assist x2, mult standing rest breaks taken. Increased distance and very motivated to walk today. SaO2 remained high throughout walk.  Cristy Hilts, MS, ACSM CES

## 2012-06-25 NOTE — Progress Notes (Signed)
Patient ID: [name], male   DOB: [birth year], 67 y.o.   MRN: [id number]    Subjective:  The patient notes no chest pain, palpitations, or SOB this morning.    Objective:  Vital Signs in the last 24 hours: Temp:  [97.4 F (36.3 C)-98 F (36.7 C)] 97.4 F (36.3 C) (05/31 0504) Pulse Rate:  [66-70] 66 (05/31 0504) Resp:  [16-18] 16 (05/31 0504) BP: (91-136)/(49-58) 136/58 mmHg (05/31 0504) SpO2:  [94 %-97 %] 95 % (05/31 0504)  Intake/Output from previous day: 05/30 0701 - 05/31 0700 In: 1530 [P.O.:1530] Out: 650 [Urine:650]  Physical Exam: General: alert, cooperative, and in no apparent distress. Obese.  HEENT: pupils equal round and reactive to light, vision grossly intact, oropharynx clear and non-erythematous  Neck: supple, no lymphadenopathy, no JVD Lungs: clear to ascultation bilaterally, normal work of respiration, no wheezes, rales, ronchi Heart: regular rate and rhythm, no murmurs, gallops, or rubs Abdomen: soft, non-tender, non-distended, normal bowel sounds Extremities: bilateral non-pitting edema Neurologic: alert & oriented X3, cranial nerves II-XII intact, strength grossly intact, sensation intact to light touch  Lab Results:  Recent Labs  06/23/12 0644 06/24/12 1042  WBC 9.0 7.3  HGB 14.3 14.2  PLT 170 172    Recent Labs  06/23/12 0644 06/24/12 1042  NA 139 138  K 4.1 3.4*  CL 103 103  CO2 27 27  GLUCOSE 151* 166*  BUN 9 9  CREATININE 0.75 0.74    Recent Labs  06/23/12 0644 06/24/12 1043  TROPONINI 14.30* 4.10*    Cardiac Studies: AO: 111/68 mmHg  LV: 115/12 mmHg  LVEDP: 21 mmHg  Coronary angiography:  Coronary dominance: Right  Left Main: Normal in size with patent stent. There is 20% distal stenosis which is unchanged.  Left Anterior Descending (LAD): Moderately calcified throughout its course. There is diffuse 40% disease throughout the midsegment. The vessel is occluded distally with faint collaterals from the diagonal.  1st  diagonal (D1): Normal in size with 80% ostial stenosis.  2nd diagonal (D2): Normal in size with 20% ostial stenosis.  3rd diagonal (D3): Medium in size with no significant disease.  Circumflex (LCx): Normal in size and nondominant. There is 30% mid disease.  Right Coronary Artery: Very large in size and dominant. Overlapped  noted extending from the ostium to the midsegment. The stents are occluded in the midsegment with large thrombus burden.  Posterior descending artery: Large in size with minor irregularities.  Posterior AV segment: Large in size with 40-50% disease in the midsegment.  Posterolateral branchs: No significant disease. Left ventriculography: Left ventricular systolic function is normal , LVEF is estimated at 55 %, there is no significant mitral regurgitation  PCI Note: Following the diagnostic procedure, the decision was made to proceed with PCI. I given additional heparin with a total of 12,000 units. ACT was 285. Once a therapeutic ACT was achieved, a 6 [place] JR 4 guide catheter was inserted. A run through coronary guidewire was used to cross the lesion. Aspiration thrombectomy was performed with an expressway catheter with 4 runs. We retrieved large amount of white thrombus. This established TIMI 3 flow. I then decided to perform balloon angioplasty with a 4.0 X20 mm noncompliant balloon. I could not advance the balloon in the midsegment due to calcifications. Thus, I used a BMW wire as a buddy. The balloon was then advanced with multiple inflations done to a maximum of 16 atmospheres throughout the stents. There was mild residual thrombus most notable in the  proximal area. I decided to start treatment with Integrilin. The patient was also given 60 mg of Effient. I decided not to add any stents given that this was likely due to interruption of dual antiplatelet therapy in the setting of long overlapped drug-eluting stents in relatively heavily calcified vessel. Following PCI, there was  20% residual stenosis and TIMI-3 flow. Final angiography confirmed very good result. The patient tolerated the procedure well. There were no immediate procedural complications.  PCI Data:  Vessel - proximal and mid RCA/Segment - 1 and 2  Percent Stenosis (pre) 100%  TIMI-flow 0  Stent : No stent. Only aspiration thrombectomy and balloon angioplasty.  Percent Stenosis (post) 20%  TIMI-flow (post) 0  Final Conclusions:  1. Acute inferior ST elevation myocardial infarction due to late stent thrombosis involving the right coronary artery in the setting of interruption of dual antiplatelet therapy. Continued patency of the left main stent.  2. Normal LV systolic function with mildly elevated left ventricular end-diastolic pressure.  3. Successful aspiration thrombectomy and balloon angioplasty to the right coronary artery. There was mild residual thrombus which was left to be treated medically.  Echocardiogram 06/23/12 - Left ventricle: The images are severely limited. The contrast images help some. My guess for EF would be 45%. Images were inadequate for LV wall motion assessment. - Right ventricle: Not able to assess RV function due to poor visualization. Not able to assess RV size due to poor visualization. I think there is RV dilitation and RV dysfunction.  Tele: NSR  Assessment/Plan:  The patient is a 67 yo man, history of CAD s/p prior stents, presenting with late stent thrombosis after DES to RCA 05/09/12, s/p aspiration thrombectomy and balloon angioplasty to RCA.  # STEMI - due to  in-stent thrombosis after DES to RCA 05/09/12, in the setting of medication non-compliance.  The patient is s/p cardiac cath 5/28, with aspiration thrombectomy and balloon angioplasty to RCA.  No further chest pain.  EF 45% on echo. -continue aspirin, effient -continue coreg, losartan -continue atorvastatin -stressed importance of medication compliance, patient appears to understand.   # HTN - chronic,  stable -continue coreg, losartan  # DM2 - last A1C = 9.8   # Hyperlipidemia - chronic, stable -continue atorvastatin  # Dispo - Home today.  Continue current cardiac meds and use home diabetes regimen.   Needs cardiac rehab at [facility].  Needs followup within 2 wks with Dr. Kirke Corin at [facility].   Marca Ancona, M.D. 06/25/2012, 12:07 PM

## 2012-06-27 ENCOUNTER — Other Ambulatory Visit (HOSPITAL_COMMUNITY): Payer: Self-pay | Admitting: Internal Medicine

## 2012-06-27 ENCOUNTER — Inpatient Hospital Stay (HOSPITAL_COMMUNITY)
Admission: EM | Admit: 2012-06-27 | Discharge: 2012-06-30 | DRG: 206 | Disposition: A | Discharge status: Skilled Nursing Facility | Payer: Medicare Other | Attending: Family Medicine | Admitting: Family Medicine

## 2012-06-27 ENCOUNTER — Encounter (HOSPITAL_COMMUNITY): Payer: Self-pay | Admitting: Emergency Medicine

## 2012-06-27 ENCOUNTER — Emergency Department (HOSPITAL_COMMUNITY): Payer: Medicare Other

## 2012-06-27 DIAGNOSIS — R0600 Dyspnea, unspecified: Secondary | ICD-10-CM

## 2012-06-27 DIAGNOSIS — R5383 Other fatigue: Secondary | ICD-10-CM | POA: Diagnosis present

## 2012-06-27 DIAGNOSIS — I2510 Atherosclerotic heart disease of native coronary artery without angina pectoris: Secondary | ICD-10-CM

## 2012-06-27 DIAGNOSIS — I472 Ventricular tachycardia, unspecified: Secondary | ICD-10-CM | POA: Diagnosis present

## 2012-06-27 DIAGNOSIS — I252 Old myocardial infarction: Secondary | ICD-10-CM

## 2012-06-27 DIAGNOSIS — F3289 Other specified depressive episodes: Secondary | ICD-10-CM | POA: Diagnosis present

## 2012-06-27 DIAGNOSIS — E662 Morbid (severe) obesity with alveolar hypoventilation: Principal | ICD-10-CM | POA: Diagnosis present

## 2012-06-27 DIAGNOSIS — R079 Chest pain, unspecified: Secondary | ICD-10-CM

## 2012-06-27 DIAGNOSIS — B9789 Other viral agents as the cause of diseases classified elsewhere: Secondary | ICD-10-CM | POA: Diagnosis present

## 2012-06-27 DIAGNOSIS — M1990 Unspecified osteoarthritis, unspecified site: Secondary | ICD-10-CM | POA: Diagnosis present

## 2012-06-27 DIAGNOSIS — R5381 Other malaise: Secondary | ICD-10-CM

## 2012-06-27 DIAGNOSIS — Z79899 Other long term (current) drug therapy: Secondary | ICD-10-CM

## 2012-06-27 DIAGNOSIS — I2589 Other forms of chronic ischemic heart disease: Secondary | ICD-10-CM | POA: Diagnosis present

## 2012-06-27 DIAGNOSIS — E119 Type 2 diabetes mellitus without complications: Secondary | ICD-10-CM | POA: Diagnosis present

## 2012-06-27 DIAGNOSIS — I255 Ischemic cardiomyopathy: Secondary | ICD-10-CM | POA: Diagnosis present

## 2012-06-27 DIAGNOSIS — I509 Heart failure, unspecified: Secondary | ICD-10-CM | POA: Diagnosis present

## 2012-06-27 DIAGNOSIS — R1310 Dysphagia, unspecified: Secondary | ICD-10-CM | POA: Diagnosis present

## 2012-06-27 DIAGNOSIS — F411 Generalized anxiety disorder: Secondary | ICD-10-CM | POA: Diagnosis present

## 2012-06-27 DIAGNOSIS — IMO0001 Reserved for inherently not codable concepts without codable children: Secondary | ICD-10-CM

## 2012-06-27 DIAGNOSIS — I872 Venous insufficiency (chronic) (peripheral): Secondary | ICD-10-CM | POA: Diagnosis present

## 2012-06-27 DIAGNOSIS — J029 Acute pharyngitis, unspecified: Secondary | ICD-10-CM | POA: Diagnosis present

## 2012-06-27 DIAGNOSIS — Z23 Encounter for immunization: Secondary | ICD-10-CM

## 2012-06-27 DIAGNOSIS — E785 Hyperlipidemia, unspecified: Secondary | ICD-10-CM | POA: Diagnosis present

## 2012-06-27 DIAGNOSIS — Z794 Long term (current) use of insulin: Secondary | ICD-10-CM

## 2012-06-27 DIAGNOSIS — R531 Weakness: Secondary | ICD-10-CM

## 2012-06-27 DIAGNOSIS — F329 Major depressive disorder, single episode, unspecified: Secondary | ICD-10-CM | POA: Diagnosis present

## 2012-06-27 DIAGNOSIS — G4730 Sleep apnea, unspecified: Secondary | ICD-10-CM | POA: Diagnosis present

## 2012-06-27 DIAGNOSIS — Z9861 Coronary angioplasty status: Secondary | ICD-10-CM

## 2012-06-27 DIAGNOSIS — I4729 Other ventricular tachycardia: Secondary | ICD-10-CM | POA: Diagnosis present

## 2012-06-27 DIAGNOSIS — Z6841 Body Mass Index (BMI) 40.0 and over, adult: Secondary | ICD-10-CM

## 2012-06-27 DIAGNOSIS — I10 Essential (primary) hypertension: Secondary | ICD-10-CM | POA: Diagnosis present

## 2012-06-27 DIAGNOSIS — Z7982 Long term (current) use of aspirin: Secondary | ICD-10-CM

## 2012-06-27 DIAGNOSIS — Z8249 Family history of ischemic heart disease and other diseases of the circulatory system: Secondary | ICD-10-CM

## 2012-06-27 HISTORY — DX: Depression, unspecified: F32.A

## 2012-06-27 HISTORY — DX: Anxiety disorder, unspecified: F41.9

## 2012-06-27 HISTORY — DX: Major depressive disorder, single episode, unspecified: F32.9

## 2012-06-27 HISTORY — DX: Sleep apnea, unspecified: G47.30

## 2012-06-27 HISTORY — DX: Heart failure, unspecified: I50.9

## 2012-06-27 LAB — BASIC METABOLIC PANEL
BUN: 12 mg/dL (ref 6–23)
CO2: 24 mEq/L (ref 19–32)
GFR calc non Af Amer: >90 mL/min (ref >90)
Glucose, Bld: 242 mg/dL — ABNORMAL HIGH (ref 70–99)
Potassium: 3.8 mEq/L (ref 3.5–5.1)
Sodium: 134 mEq/L — ABNORMAL LOW (ref 135–145)

## 2012-06-27 LAB — CBC
HCT: 40.9 % (ref 39.0–52.0)
Hemoglobin: 13.8 g/dL (ref 13.0–17.0)
MCH: 26.6 pg (ref 26.0–34.0)
MCHC: 33.7 g/dL (ref 30.0–36.0)
RBC: 5.18 MIL/uL (ref 4.22–5.81)

## 2012-06-27 LAB — POCT I-STAT, CHEM 8
Calcium, Ion: 1.12 mmol/L — ABNORMAL LOW (ref 1.13–1.30)
Chloride: 102 mEq/L (ref 96–112)
Creatinine, Ser: 0.8 mg/dL (ref 0.50–1.35)
Glucose, Bld: 235 mg/dL — ABNORMAL HIGH (ref 70–99)
HCT: 42 % (ref 39.0–52.0)
Potassium: 3.9 mEq/L (ref 3.5–5.1)

## 2012-06-27 LAB — POCT I-STAT TROPONIN I

## 2012-06-27 MED ORDER — NITROGLYCERIN 0.4 MG SL SUBL: 0.4000 mg | SUBLINGUAL_TABLET | SUBLINGUAL | Status: DC | PRN

## 2012-06-27 MED ORDER — ASPIRIN 81 MG PO CHEW: 324.0000 mg | CHEWABLE_TABLET | Freq: Once | ORAL | Status: DC

## 2012-06-27 NOTE — H&P (Signed)
Family Medicine Teaching [facility] Admission History and Physical  Patient name: [name] Medical record number: [id number] Date of birth: 1945-02-25 Age: 67 y.o. Gender: male  Primary Care Provider: Barbette Reichmann, MD  Chief Complaint: weakness History of Present Illness: [name] is a 67 y.o. year old male with CAD s/p MI 5/28, HTN, HLD, DM presenting with 3 day history of progressive weakness, cough, shortness of breath and intermittent chest pain.  He was recently admitted 5/28-31 for inferior MI.  He states that he felt pretty good at time of discharge, but then developed progressive weakness.  He states that he has had rhinorrhea, cough, shortness of breath for the last 3 days.  This is associated with subjective fevers and chills.  He reports intermittent chest pressure that occurs with any sort of movement and resolves quickly with rest.  States that his dyspnea gets worse with laying flat and he has been sleeping more propped up than normal.  Denies any increase in lower extremity swelling.  Denies any nausea, vomiting, abdominal pain, diarrhea, dysuria, trouble eating/drinking.  He called EMS today because of the weakness and not being able to care for himself at home.  ED Course: New oxygen requirement; troponin elevated but trending down; pro-BNP elevated  Patient Active Problem List   Diagnosis Date Noted  . Dyspnea 06/27/2012  . Acute exacerbation of CHF (congestive heart failure) 06/27/2012  . Chest pain 06/27/2012  . ST elevation myocardial infarction (STEMI) of inferior wall 06/22/2012  . NSTEMI (non-ST elevated myocardial infarction) 05/10/2012  . IDDM (insulin dependent diabetes mellitus) 05/10/2012  . Cardiomyopathy, ischemic 05/10/2012  . Coronary artery disease   . Hypertension   . Hyperlipidemia   . Morbid obesity    Past Medical History: Past Medical History  Diagnosis Date  . Hypertension   . Hyperlipidemia   . Morbid obesity   . NSVT  (nonsustained ventricular tachycardia)     a. 04/2012 post MI; normal LVF   . Physical deconditioning     requiring outpatient PT  . Osteoarthritis   . Lower extremity cellulitis   . Peripheral edema   . Diabetes mellitus      Insulin-dependent  . Chronic knee pain   . Anemia, unspecified   . Ischemic cardiomyopathy     a. 4.2014 EF 40% by LV gram.  . Coronary artery disease     a. s/p anterior STEMI 07/13/10: tx with Promus DES to distal LM; s/p POBA to distal LAD;  b. 04/2012 NSTEMI/Cath/PCI: LM 20%, LAD 20% p, 52m, 100d, D1 60-70ost, D2 nl, D3 nl, LCX mid dzs, OM1/2/3 min irregs, RCA dom 60ost, 90p, 29m (3.5x38 & 4.0x24 Promus DES'), EF 40%, inf HK. c. 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    Past Surgical History: Past Surgical History  Procedure Laterality Date  . Coronary stent placement      drug-eluting to the distal left main and PTCA of the distal left anterior descending.  . Skin graft Right     history  . Knee surgery Left   . Cataract surgery    . Cardiac catheterization    . Cardiac catheterization      x2 stents;[facility]    Social History: History   Social History  . Marital Status: Married    Spouse Name: [name]    Number of Children: 2  . Years of Education: [name]   Social History Main Topics  .  Smoking status: Never Smoker   . Smokeless tobacco: Never Used  . Alcohol Use: No  . Drug Use: No  . Sexually Active: Not Currently   Other Topics Concern  . None   Social History Narrative  . None    Family History: Family History  Problem Relation Age of Onset  . Heart attack Brother 60    died  . Coronary artery disease      strongly positive family hx of  . Heart attack Father     Allergies: No Known Allergies  Current Facility-Administered Medications  Medication Dose Route Frequency Provider Last Rate Last Dose  . aspirin chewable tablet 324 mg  324 mg Oral Once Toney Sang, MD      . nitroGLYCERIN (NITROSTAT) SL tablet 0.4 mg  0.4 mg Sublingual Q5 min PRN Toney Sang, MD       Current Outpatient Prescriptions  Medication Sig Dispense Refill  . acetaminophen (TYLENOL) 500 MG tablet Take 2 tablets (1,000 mg total) by mouth every 6 (six) hours as needed for pain (knee and chest pain).  30 tablet    . aspirin 81 MG tablet Take 1 tablet (81 mg total) by mouth daily.      Marland Kitchen atorvastatin (LIPITOR) 80 MG tablet Take 1 tablet (80 mg total) by mouth daily.  30 tablet  3  . betamethasone valerate ointment (VALISONE) 0.1 % Apply 1 application topically 2 (two) times daily as needed (for excema).      . carvedilol (COREG) 6.25 MG tablet Take 1 tablet (6.25 mg total) by mouth 2 (two) times daily with a meal.  60 tablet  3  . glyBURIDE-metformin (GLUCOVANCE) 2.5-500 MG per tablet Take 2 tablets by mouth 2 (two) times daily with a meal.      . insulin NPH (HUMULIN N,NOVOLIN N) 100 UNIT/ML injection Inject 50 Units into the skin 2 (two) times daily.      Marland Kitchen losartan (COZAAR) 25 MG tablet Take 1 tablet (25 mg total) by mouth daily.  30 tablet  3  . nitroGLYCERIN (NITROSTAT) 0.4 MG SL tablet Place 1 tablet (0.4 mg total) under the tongue every 5 (five) minutes as needed for chest pain (up to 3 doses).  25 tablet  3  . prasugrel (EFFIENT) 10 MG TABS Take 1 tablet (10 mg total) by mouth daily.  30 tablet  6   Review Of Systems: Per HPI with the following additions: none Otherwise 12 point review of systems was performed and was unremarkable.  Physical Exam: Pulse: 74  Blood Pressure: 118/54 RR: 19   O2: 96 on 2L Temp: 98.6  General: alert, cooperative, appears older than stated age, mild distress, morbidly obese and mild distress with any sort of movement HEENT: PERRLA, extra ocular movement intact, sclera clear, anicteric, oropharynx clear, no lesions and neck supple with midline trachea Heart: S1, S2 normal, no murmur, rub or gallop, regular rate and rhythm Lungs:  unlabored breathing, mild bibasilar crackles, no wheezes Abdomen: abdomen is soft without significant tenderness, masses, organomegaly or guarding Extremities: trace edema in bilateral legs to knees Skin:no rashes Neurology: normal without focal findings, mental status, speech normal, alert and oriented x3, PERLA and muscle tone and strength normal and symmetric  Labs and Imaging: Lab Results  Component Value Date/Time   NA 136 06/27/2012  8:48 PM   K 3.9 06/27/2012  8:48 PM   CL 102 06/27/2012  8:48 PM   CO2 24 06/27/2012  8:33 PM   BUN  11 06/27/2012  8:48 PM   CREATININE 0.80 06/27/2012  8:48 PM   GLUCOSE 235* 06/27/2012  8:48 PM   Lab Results  Component Value Date   WBC 10.6* 06/27/2012   HGB 14.3 06/27/2012   HCT 42.0 06/27/2012   MCV 79.0 06/27/2012   PLT 197 06/27/2012   Trop: 0.40 Pro-BNP: 1937  CXR: 1. Low lung volumes without radiographic evidence of acute  cardiopulmonary disease.  EKG: nsr with 1st degree AV block, non-specific T wave changes  Assessment and Plan: LORETTA KLUENDER is a 67 y.o. year old male with CAD s/p posterior MI 4 days ago, morbid obesity, DM, CHF presenting with weakness, dyspnea and intermittent chest pain.  Pro-BNP is elevated at 1900.  # Dyspnea: Concern for CHF exacerbation vs pneumonia.  New oxygen requirement.  CXR was of poor quality but did not show frank pulmonary edema or infiltrate.  Pro-BNP and orthopnea go with CHF exacerbation but he has minimal LE edema.  Subjective fevers and mild white count concerning for pneumonia. - will get 2 view CXR in am - lasix 40mg  IV x1 tonight, redose based on response - start cefepime and vanc for possible HCAP - continue home ARB and BB - strict I&Os - daily weights - SLIV  # Chest pain: Admitted 5/28 with inferior STEMI, had cath with successful aspiration thrombectomy and balloon angioplasty to the RCA, EF was normal on cath.  Current chest pain is likely angina given history and presence with activity.  Troponin is  mildly elevated at 0.4, but this is trending down from previous hospitalization. - No active chest pain unless he moves - monitor in step down on telemetry - SL nitroglycerin prn  - morphine 1mg  q2hr prn - will continue effient and aspirin - continue metoprolol - cycle troponins - risk stratification labs done last month except for TSH which I will order - will call cardiology in AM  # Weakness: May be related to current CHF exacerbation vs ?PNA.  May also be related to recent MI.   - treat acute issues as above - consult PT/OT for recs - SW consult for likely SNF placement - patient agreeable to this  # Hypertension: Stable. - continue home meds  # DM: A1c 8.0 on 06/24/12.  On insulin NPH 50 units BID at home + metformin/glyburide - will continue NPH at 25 units BID - moderate SSI - hold metformin/glyburide   FEN/GI: SLIV, carb-modified diet Prophylaxis: SQ heparin Disposition: admit to step down; d/c pending clinical improvement and PT/OT evaluation  BOOTH, Matalynn Graff 06/28/2012, 12:03 AM

## 2012-06-27 NOTE — ED Provider Notes (Signed)
History     CSN: [id number]  Arrival date & time 06/27/12  3086   First MD Initiated Contact with Patient 06/27/12 1941      Chief Complaint  Patient presents with  . Chest Pain    HPI 67 year old male with history of MI, morbid obesity, and multiple chronic medical comorbidities presents complaining of chest pain.  He sleeps in a bariatric bed in his living room at home. Today he could not get out of his bed, therefore he called EMS for assistance. On arrival he was assisted out of bed, however while exerting himself in this effort he became very diaphoretic, short of breath, and complained of chest pain. He was recently discharged from here after suffering a myocardial infarction which was managed with thrombectomy at cath. Due to chest pain, EMS brought him here for further evaluation. They report that his living conditions" terrible." He stated there was food, 3 containers, clothing, all over his house and it is obvious that he is not able to care for himself at this point. The patient reports he has been in a skilled nursing facility before and he thinks that he needs placement in a nursing facility again as he is unable to adequately care for himself.  He reports that his chest pain today started while he was trying out of bed, it was located to the center of his chest, it did not radiate, it was moderate in severity, it was similar to his prior heart attack but not as severe. It was associated with nausea, diaphoresis, and shortness of breath. Currently, he has very minimal chest pain. It improved with nitroglycerin en route.   Past Medical History  Diagnosis Date  . Hypertension   . Hyperlipidemia   . Morbid obesity   . NSVT (nonsustained ventricular tachycardia)     a. 04/2012 post MI; normal LVF   . Physical deconditioning     requiring outpatient PT  . Osteoarthritis   . Lower extremity cellulitis   . Peripheral edema   . Diabetes mellitus      Insulin-dependent  . Chronic  knee pain   . Anemia, unspecified   . Ischemic cardiomyopathy     a. 4.2014 EF 40% by LV gram.  . Coronary artery disease     a. s/p anterior STEMI 07/13/10: tx with Promus DES to distal LM; s/p POBA to distal LAD;  b. 04/2012 NSTEMI/Cath/PCI: LM 20%, LAD 20% p, 55m, 100d, D1 60-70ost, D2 nl, D3 nl, LCX mid dzs, OM1/2/3 min irregs, RCA dom 60ost, 90p, 46m (3.5x38 & 4.0x24 Promus DES'), EF 40%, inf HK. c. 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  Marland Kitchen Myocardial infarction   . Anxiety   . Depression   . Sleep apnea   . CHF (congestive heart failure)     Past Surgical History  Procedure Laterality Date  . Coronary stent placement      drug-eluting to the distal left main and PTCA of the distal left anterior descending.  . Skin graft Right     history  . Knee surgery Left   . Cataract surgery    . Cardiac catheterization    . Cardiac catheterization      x2 stents;[facility]    Family History  Problem Relation Age of Onset  . Heart attack Brother 60    died  . Coronary artery disease      strongly positive family hx of  .  Heart attack Father     History  Substance Use Topics  . Smoking status: Never Smoker   . Smokeless tobacco: Never Used  . Alcohol Use: No      Review of Systems  Constitutional: Negative for fever and chills.  HENT: Negative for congestion, rhinorrhea, neck pain and neck stiffness.   Eyes: Negative for visual disturbance.  Respiratory: Positive for shortness of breath. Negative for cough.   Cardiovascular: Positive for chest pain. Negative for leg swelling.  Gastrointestinal: Positive for nausea. Negative for vomiting, abdominal pain and diarrhea.  Genitourinary: Negative for dysuria, urgency, frequency, flank pain and difficulty urinating.  Musculoskeletal: Negative for back pain.  Skin: Negative for rash.  Neurological: Negative for syncope, weakness, numbness and headaches.  All  other systems reviewed and are negative.    Allergies  Review of patient's allergies indicates no known allergies.  Home Medications   No current outpatient prescriptions on file.  BP 113/54  Pulse 74  Temp(Src) 98.1 F (36.7 C) (Oral)  Resp 19  Ht 6' (1.829 m)  Wt 375 lb 7.1 oz (170.3 kg)  BMI 50.91 kg/m2  SpO2 95%  Physical Exam  Nursing note and vitals reviewed. Constitutional: He is oriented to person, place, and time. He appears well-developed and well-nourished. No distress.  Morbidly obese  HENT:  Head: Normocephalic and atraumatic.  Mouth/Throat: Oropharynx is clear and moist.  Eyes: Conjunctivae and EOM are normal. Pupils are equal, round, and reactive to light. No scleral icterus.  Neck: Normal range of motion. Neck supple. No JVD present.  Cardiovascular: Normal rate, regular rhythm, normal heart sounds and intact distal pulses.  Exam reveals no gallop and no friction rub.   No murmur heard. Pulmonary/Chest: Effort normal and breath sounds normal. No respiratory distress. He has no wheezes. He has no rales.  Abdominal: Soft. Bowel sounds are normal. He exhibits no distension. There is no tenderness. There is no rebound and no guarding.  Musculoskeletal: He exhibits no edema.  Neurological: He is alert and oriented to person, place, and time. No cranial nerve deficit. He exhibits normal muscle tone. Coordination normal.  Skin: Skin is warm and dry. He is not diaphoretic.    ED Course  Procedures (including critical care time)  Labs Reviewed  PRO B NATRIURETIC PEPTIDE - Abnormal; Notable for the following:    Pro B Natriuretic peptide (BNP) 1937.0 (*)    All other components within normal limits  CBC - Abnormal; Notable for the following:    WBC 10.6 (*)    All other components within normal limits  BASIC METABOLIC PANEL - Abnormal; Notable for the following:    Sodium 134 (*)    Glucose, Bld 242 (*)    All other components within normal limits  GLUCOSE,  CAPILLARY - Abnormal; Notable for the following:    Glucose-Capillary 215 (*)    All other components within normal limits  POCT I-STAT, CHEM 8 - Abnormal; Notable for the following:    Glucose, Bld 235 (*)    Calcium, Ion 1.12 (*)    All other components within normal limits  POCT I-STAT TROPONIN I - Abnormal; Notable for the following:    Troponin i, poc 0.40 (*)    All other components within normal limits  CBC  COMPREHENSIVE METABOLIC PANEL  TROPONIN I  TROPONIN I  TSH   Dg Chest Port 1 View  06/27/2012   *RADIOLOGY REPORT*  Clinical Data: Nausea, vomiting and weakness.  History of blood clots.  PORTABLE CHEST - 1 VIEW  Comparison: Chest x-ray 03/20/2012.  Findings: Lung volumes are low.  No definite acute consolidative airspace disease or pleural effusions.  Pulmonary venous congestion, likely accentuated by low lung volumes, without frank pulmonary edema.  Heart size appears borderline enlarged.  Upper mediastinal contours are within normal limits given the patient's low lung volumes and lordotic positioning.  IMPRESSION: 1.  Low lung volumes without radiographic evidence of acute cardiopulmonary disease.   Original Report Authenticated By: Trudie Reed, M.D.     1. Chest pain       MDM  67 year old male, morbidly obese, with coronary disease and recent MI here with chest pain. This came on while he was exerting himself an effort to get out of bed. States that he could not get out of bed today so he called EMS and his symptoms developed while they were assisting him out of bed. EMS reported that his living conditions are very poor and it is evident from being in his house that he is unable to care for himself.  His vitals are essentially within normal limits. He is morbidly obese. His exam is limited by this, but is otherwise unremarkable. EKG demonstrates normal sinus rhythm with nonspecific T-wave abnormality in the inferior and precordial leads; his T-wave  flattening/inversion in the lateral precordial leads are new compared to prior. His troponin is 0.4.  BNP is 1937. Labs otherwise essentially unremarkable. Chest x-ray demonstrates no acute cardiopulmonary disease.  Think he needs to be admitted for ACS rule out. He also will likely require placement at a skilled nursing facility.        Toney Sang, MD 06/28/12 [phone]

## 2012-06-27 NOTE — ED Notes (Signed)
Pt states he is currently cp free.

## 2012-06-27 NOTE — ED Notes (Addendum)
Per ems-- pt from home. Pt had STEMI last week. Cath lab cleared 2 clots- no stent placement. Today pt fell due to bed being broken. After EMS helped him to his bed- pt became diaphoretic, nausea, sob, cp. Pt living conditions not suitable. Pt asking for help with living situation. IV L upper arm. ems administered 324 asa and 2 nitro. Nitro decreased pain slightly.

## 2012-06-28 ENCOUNTER — Inpatient Hospital Stay (HOSPITAL_COMMUNITY): Payer: Medicare Other

## 2012-06-28 ENCOUNTER — Encounter (HOSPITAL_COMMUNITY): Payer: Self-pay | Admitting: *Deleted

## 2012-06-28 DIAGNOSIS — I2510 Atherosclerotic heart disease of native coronary artery without angina pectoris: Secondary | ICD-10-CM

## 2012-06-28 DIAGNOSIS — R5383 Other fatigue: Secondary | ICD-10-CM

## 2012-06-28 DIAGNOSIS — R079 Chest pain, unspecified: Secondary | ICD-10-CM

## 2012-06-28 LAB — CBC
HCT: 41.4 % (ref 39.0–52.0)
Hemoglobin: 13.9 g/dL (ref 13.0–17.0)
MCHC: 33.6 g/dL (ref 30.0–36.0)
MCV: 79.2 fL (ref 78.0–100.0)
RDW: 14 % (ref 11.5–15.5)
WBC: 10.1 10*3/uL (ref 4.0–10.5)

## 2012-06-28 LAB — GLUCOSE, CAPILLARY
Glucose-Capillary: 157 mg/dL — ABNORMAL HIGH (ref 70–99)
Glucose-Capillary: 173 mg/dL — ABNORMAL HIGH (ref 70–99)
Glucose-Capillary: 215 mg/dL — ABNORMAL HIGH (ref 70–99)
Glucose-Capillary: 305 mg/dL — ABNORMAL HIGH (ref 70–99)

## 2012-06-28 LAB — COMPREHENSIVE METABOLIC PANEL
Albumin: 2.9 g/dL — ABNORMAL LOW (ref 3.5–5.2)
Alkaline Phosphatase: 84 U/L (ref 39–117)
BUN: 10 mg/dL (ref 6–23)
Chloride: 99 mEq/L (ref 96–112)
Creatinine, Ser: 0.76 mg/dL (ref 0.50–1.35)
GFR calc Af Amer: >90 mL/min (ref >90)
Glucose, Bld: 169 mg/dL — ABNORMAL HIGH (ref 70–99)
Potassium: 3.7 mEq/L (ref 3.5–5.1)
Total Bilirubin: 0.7 mg/dL (ref 0.3–1.2)

## 2012-06-28 LAB — TROPONIN I
Troponin I: <0.3 ng/mL (ref <0.30)
Troponin I: <0.3 ng/mL (ref <0.30)

## 2012-06-28 MED ORDER — INSULIN NPH (HUMAN) (ISOPHANE) 100 UNIT/ML ~~LOC~~ SUSP
25.0000 [IU] | Freq: Every day | SUBCUTANEOUS | Status: DC
  Administered 2012-06-28 – 2012-06-30 (×3): 25 [IU] via SUBCUTANEOUS
  Filled 2012-06-28 (×2): qty 10

## 2012-06-28 MED ORDER — HEPARIN SODIUM (PORCINE) 5000 UNIT/ML IJ SOLN
5000.0000 [IU] | Freq: Three times a day (TID) | INTRAMUSCULAR | Status: DC
  Administered 2012-06-28 – 2012-06-30 (×7): 5000 [IU] via SUBCUTANEOUS
  Filled 2012-06-28 (×10): qty 1

## 2012-06-28 MED ORDER — CARVEDILOL 6.25 MG PO TABS
6.2500 mg | ORAL_TABLET | Freq: Two times a day (BID) | ORAL | Status: DC
  Administered 2012-06-28 – 2012-06-30 (×5): 6.25 mg via ORAL
  Filled 2012-06-28 (×7): qty 1

## 2012-06-28 MED ORDER — MORPHINE SULFATE 2 MG/ML IJ SOLN: 1.0000 mg | INTRAMUSCULAR | Status: DC | PRN

## 2012-06-28 MED ORDER — ACETAMINOPHEN 650 MG RE SUPP: 650.0000 mg | Freq: Four times a day (QID) | RECTAL | Status: DC | PRN

## 2012-06-28 MED ORDER — PHENOL 1.4 % MT LIQD
1.0000 | OROMUCOSAL | Status: DC | PRN
  Filled 2012-06-28: qty 177

## 2012-06-28 MED ORDER — LOSARTAN POTASSIUM 25 MG PO TABS
25.0000 mg | ORAL_TABLET | Freq: Every day | ORAL | Status: DC
  Administered 2012-06-28 – 2012-06-30 (×3): 25 mg via ORAL
  Filled 2012-06-28 (×3): qty 1

## 2012-06-28 MED ORDER — INSULIN NPH (HUMAN) (ISOPHANE) 100 UNIT/ML ~~LOC~~ SUSP
25.0000 [IU] | Freq: Every day | SUBCUTANEOUS | Status: DC
  Administered 2012-06-28 – 2012-06-30 (×3): 25 [IU] via SUBCUTANEOUS

## 2012-06-28 MED ORDER — ASPIRIN EC 81 MG PO TBEC
81.0000 mg | DELAYED_RELEASE_TABLET | Freq: Every day | ORAL | Status: DC
  Administered 2012-06-28 – 2012-06-30 (×3): 81 mg via ORAL
  Filled 2012-06-28 (×3): qty 1

## 2012-06-28 MED ORDER — ONDANSETRON HCL 4 MG/2ML IJ SOLN: 4.0000 mg | Freq: Four times a day (QID) | INTRAMUSCULAR | Status: DC | PRN

## 2012-06-28 MED ORDER — VANCOMYCIN HCL 10 G IV SOLR
2500.0000 mg | Freq: Once | INTRAVENOUS | Status: AC
  Administered 2012-06-28: 2500 mg via INTRAVENOUS
  Filled 2012-06-28: qty 2500

## 2012-06-28 MED ORDER — ATORVASTATIN CALCIUM 80 MG PO TABS
80.0000 mg | ORAL_TABLET | Freq: Every day | ORAL | Status: DC
  Administered 2012-06-28 – 2012-06-29 (×2): 80 mg via ORAL
  Filled 2012-06-28 (×3): qty 1

## 2012-06-28 MED ORDER — SODIUM CHLORIDE 0.9 % IV SOLN: 250.0000 mL | INTRAVENOUS | Status: DC | PRN

## 2012-06-28 MED ORDER — INSULIN ASPART 100 UNIT/ML ~~LOC~~ SOLN
0.0000 [IU] | Freq: Three times a day (TID) | SUBCUTANEOUS | Status: DC
  Administered 2012-06-28: 3 [IU] via SUBCUTANEOUS
  Administered 2012-06-28: 11 [IU] via SUBCUTANEOUS
  Administered 2012-06-28 – 2012-06-29 (×3): 3 [IU] via SUBCUTANEOUS
  Administered 2012-06-30: 2 [IU] via SUBCUTANEOUS

## 2012-06-28 MED ORDER — PRASUGREL HCL 10 MG PO TABS
10.0000 mg | ORAL_TABLET | Freq: Every day | ORAL | Status: DC
  Administered 2012-06-28 – 2012-06-30 (×3): 10 mg via ORAL
  Filled 2012-06-28 (×3): qty 1

## 2012-06-28 MED ORDER — VANCOMYCIN HCL 10 G IV SOLR
1750.0000 mg | Freq: Two times a day (BID) | INTRAVENOUS | Status: DC
  Administered 2012-06-28: 1750 mg via INTRAVENOUS
  Filled 2012-06-28 (×2): qty 1750

## 2012-06-28 MED ORDER — SODIUM CHLORIDE 0.9 % IJ SOLN
3.0000 mL | Freq: Two times a day (BID) | INTRAMUSCULAR | Status: DC
  Administered 2012-06-29 – 2012-06-30 (×2): 3 mL via INTRAVENOUS

## 2012-06-28 MED ORDER — INSULIN NPH (HUMAN) (ISOPHANE) 100 UNIT/ML ~~LOC~~ SUSP
25.0000 [IU] | Freq: Two times a day (BID) | SUBCUTANEOUS | Status: DC
Start: 2012-06-28 — End: 2012-06-28

## 2012-06-28 MED ORDER — ONDANSETRON HCL 4 MG PO TABS: 4.0000 mg | ORAL_TABLET | Freq: Four times a day (QID) | ORAL | Status: DC | PRN

## 2012-06-28 MED ORDER — SODIUM CHLORIDE 0.9 % IJ SOLN: 3.0000 mL | INTRAMUSCULAR | Status: DC | PRN

## 2012-06-28 MED ORDER — FUROSEMIDE 10 MG/ML IJ SOLN
40.0000 mg | Freq: Once | INTRAMUSCULAR | Status: AC
  Administered 2012-06-28: 40 mg via INTRAVENOUS
  Filled 2012-06-28: qty 4

## 2012-06-28 MED ORDER — DEXTROSE 5 % IV SOLN
2.0000 g | Freq: Three times a day (TID) | INTRAVENOUS | Status: DC
  Administered 2012-06-28 (×2): 2 g via INTRAVENOUS
  Filled 2012-06-28 (×4): qty 2

## 2012-06-28 MED ORDER — SODIUM CHLORIDE 0.9 % IJ SOLN
3.0000 mL | Freq: Two times a day (BID) | INTRAMUSCULAR | Status: DC
  Administered 2012-06-28 – 2012-06-30 (×4): 3 mL via INTRAVENOUS

## 2012-06-28 MED ORDER — ACETAMINOPHEN 325 MG PO TABS
650.0000 mg | ORAL_TABLET | Freq: Four times a day (QID) | ORAL | Status: DC | PRN
  Administered 2012-06-28 – 2012-06-29 (×3): 650 mg via ORAL
  Filled 2012-06-28 (×3): qty 2

## 2012-06-28 NOTE — H&P (Signed)
Family Medicine Teaching Service Attending Note  I interviewed and examined patient [name] and reviewed their tests and x-rays.  I discussed with Dr. Elwyn Reach and reviewed their note for today.  I agree with their assessment and plan.     Additionally  Feeling better today stilling complains of diffuse fatigue/weakness and sore throat when swallowing.  No chest pain or fever or shortness of breath Exam limited by habitus  Pneumonia unlikely given normal chest xray and lack of fever or wbc - would stop antibiotics Pulmonary emboli unlikely given good saturation but monitor for this Await cardiology opinion  May benefit from rehab for conditioning

## 2012-06-28 NOTE — Progress Notes (Signed)
Patient admitted from ER via stretcher. Patient ambulated with assist to bed. VSS. Patient instructed on callbell and placed at side. Patient oriented to unit and room. No family at bedside. Will monitor.

## 2012-06-28 NOTE — Progress Notes (Signed)
Family Medicine Teaching Service Attending Note  I discussed patient [name]  with Dr. Benjamin Stain and reviewed their note for today.  I agree with their assessment and plan.

## 2012-06-28 NOTE — Progress Notes (Signed)
Utilization review completed.  

## 2012-06-28 NOTE — Evaluation (Signed)
Physical Therapy Evaluation Patient Details Name: [name] MRN: [id number] DOB: 1945-04-25 Today's Date: 06/28/2012 Time: 4098-1191 PT Time Calculation (min): 41 min  PT Assessment / Plan / Recommendation Clinical Impression  Patient is a 67 y/o male admitted with weakness, SOB and question CHF versus pneumonia.  He is s/p recent hospitalization last week for STEMI.  He presents with decreased independence and safety with mobility due to generalized weakness, decreased activity tolerance, decreased balance, and will benefit from skilled PT in the acute setting to maximize independence and safety with mobiltiy to allow return home alone following STSNF stay.    PT Assessment  Patient needs continued PT services    Follow Up Recommendations  SNF       Barriers to Discharge Decreased caregiver support      Equipment Recommendations  None recommended by PT       Frequency Min 3X/week    Precautions / Restrictions Precautions Precautions: Fall Restrictions Weight Bearing Restrictions: No   Pertinent Vitals/Pain No pain complaints      Mobility  Bed Mobility Bed Mobility: Supine to Sit;Sit to Supine;Sitting - Scoot to Edge of Bed;Scooting to [facility] Supine to Sit: 5: Supervision;With rails;HOB elevated Sitting - Scoot to Edge of Bed: 5: Supervision;With rail Sit to Supine: 5: Supervision;HOB flat;With rail Scooting to [facility]: With rail;5: Supervision (bed in trendelenberg) Details for Bed Mobility Assistance: increased time for all mobility and needing to stop to rest after each transition Transfers Transfers: Sit to Stand;Stand to Sit Sit to Stand: 4: Min guard;From bed Stand to Sit: 4: Min guard;To bed Details for Transfer Assistance: for safety, uses momentum strategy; first attempt unsuccessful and sat back on bed, needed increased time and multiple attempts Ambulation/Gait Ambulation/Gait Assistance: 4: Min guard Ambulation Distance (Feet): 80 Feet Assistive device:  Rolling walker Ambulation/Gait Assistance Details: 2-3 standing rest breaks with elbows on walker and noted dyspnea.  demonstrates increased proximity to walker with flexed posture throughout. Gait Pattern: Step-through pattern;Decreased stride length;Shuffle;Wide base of support;Trunk flexed        PT Diagnosis: Difficulty walking;Generalized weakness  PT Problem List: Decreased strength;Decreased activity tolerance;Decreased balance;Cardiopulmonary status limiting activity;Decreased mobility PT Treatment Interventions: DME instruction;Balance training;Gait training;Stair training;Functional mobility training;Patient/family education;Therapeutic activities;Therapeutic exercise   PT Goals Acute Rehab PT Goals PT Goal Formulation: With patient Time For Goal Achievement: 07/12/12 Potential to Achieve Goals: Good Pt will go Supine/Side to Sit: with modified independence PT Goal: Supine/Side to Sit - Progress: Goal set today Pt will go Sit to Stand: with modified independence PT Goal: Sit to Stand - Progress: Goal set today Pt will Stand: with modified independence;3 - 5 min;with unilateral upper extremity support PT Goal: Stand - Progress: Goal set today Pt will Ambulate: >150 feet;with rolling walker;with modified independence PT Goal: Ambulate - Progress: Goal set today  Visit Information  Last PT Received On: 06/28/12    Subjective Data  Subjective: I just don't want to get sick again. Patient Stated Goal: To go to rehab    Prior Functioning  Home Living Lives With: Alone Type of Home: House Home Access: Stairs to enter;Ramped entrance Home Layout: One level Bathroom Shower/Tub: Health visitor: Handicapped height Home Adaptive Equipment: Built-in shower seat;Grab bars in shower;Grab bars around toilet;Walker - four wheeled;Crutches Additional Comments: states daughter may come down from [place] soon to visit, but doesn't know how long she will stay Prior  Function Level of Independence: Independent with assistive device(s) Driving: Yes Vocation: Retired Comments: used crutches in  the house and walker outside Communication Communication: No difficulties Dominant Hand: Right    Cognition  Cognition Arousal/Alertness: Awake/alert Behavior During Therapy: WFL for tasks assessed/performed Overall Cognitive Status: Within Functional Limits for tasks assessed    Extremity/Trunk Assessment Right Lower Extremity Assessment RLE ROM/Strength/Tone: [facility] for tasks assessed Left Lower Extremity Assessment LLE ROM/Strength/Tone: [facility] for tasks assessed   Balance Balance Balance Assessed: Yes Static Standing Balance Static Standing - Balance Support: Bilateral upper extremity supported Static Standing - Level of Assistance: 5: Stand by assistance  End of Session PT - End of Session Equipment Utilized During Treatment: Gait belt Activity Tolerance: Patient limited by fatigue Patient left: in bed;with call bell/phone within reach;with bed alarm set  GP     [facility] 06/28/2012, 12:13 PM Sheran Lawless, PT [phone] 06/28/2012

## 2012-06-28 NOTE — Consult Note (Signed)
    I have seen and examined the patient. I agree with the above note with the addition of : The patient is well known to me. Recent inferior STEMI due to stent thrombosis. Now presented with symptoms suggestive of viral illness/bronichitis. No clear heart failure. Recent EF was normal.  HE reports right side chest pain clearly different from recent MI. CEs are negative. ECG without any ST changes.  Continue current cardiac medications. Continue treatment of underlying condition. He reports difficulty in caring for himself at him. Consider social work consult.   Lorine Bears MD, [facility] 06/28/2012 10:02 AM

## 2012-06-28 NOTE — Progress Notes (Signed)
ANTIBIOTIC CONSULT NOTE - INITIAL  Pharmacy Consult for Vancomycin and Cefepime Indication: rule out pneumonia  No Known Allergies  Patient Measurements: Height: 6' (182.9 cm) Weight: 375 lb 7.1 oz (170.3 kg) IBW/kg (Calculated) : 77.6 Adjusted Body Weight: 120 kg  Vital Signs: Temp: 98.1 F (36.7 C) (06/03 0000) Temp src: Oral (06/03 0000) BP: 113/54 mmHg (06/03 0000) Pulse Rate: 74 (06/03 0000)  Labs:  Recent Labs  06/27/12 2033 06/27/12 2048  WBC 10.6*  --   HGB 13.8 14.3  PLT 197  --   CREATININE 0.75 0.80   Estimated Creatinine Clearance: 147.4 ml/min (by C-G formula based on Cr of 0.8). No results found for this basename: VANCOTROUGH, Leodis Binet, VANCORANDOM, GENTTROUGH, GENTPEAK, GENTRANDOM, TOBRATROUGH, TOBRAPEAK, TOBRARND, AMIKACINPEAK, AMIKACINTROU, AMIKACIN,  in the last 72 hours   Microbiology: Recent Results (from the past 720 hour(s))  MRSA PCR SCREENING     Status: None   Collection Time    06/22/12  6:31 PM      Result Value Range Status   MRSA by PCR NEGATIVE  NEGATIVE Final   Comment:            The GeneXpert MRSA Assay (FDA     approved for NASAL specimens     only), is one component of a     comprehensive MRSA colonization     surveillance program. It is not     intended to diagnose MRSA     infection nor to guide or     monitor treatment for     MRSA infections.    Medical History: Past Medical History  Diagnosis Date  . Hypertension   . Hyperlipidemia   . Morbid obesity   . NSVT (nonsustained ventricular tachycardia)     a. 04/2012 post MI; normal LVF   . Physical deconditioning     requiring outpatient PT  . Osteoarthritis   . Lower extremity cellulitis   . Peripheral edema   . Diabetes mellitus      Insulin-dependent  . Chronic knee pain   . Anemia, unspecified   . Ischemic cardiomyopathy     a. 4.2014 EF 40% by LV gram.  . Coronary artery disease     a. s/p anterior STEMI 07/13/10: tx with Promus DES to distal LM; s/p POBA  to distal LAD;  b. 04/2012 NSTEMI/Cath/PCI: LM 20%, LAD 20% p, 43m, 100d, D1 60-70ost, D2 nl, D3 nl, LCX mid dzs, OM1/2/3 min irregs, RCA dom 60ost, 90p, 45m (3.5x38 & 4.0x24 Promus DES'), EF 40%, inf HK. c. 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  Marland Kitchen Myocardial infarction   . Anxiety   . Depression   . Sleep apnea   . CHF (congestive heart failure)     Medications:  Prescriptions prior to admission  Medication Sig Dispense Refill  . acetaminophen (TYLENOL) 500 MG tablet Take 2 tablets (1,000 mg total) by mouth every 6 (six) hours as needed for pain (knee and chest pain).  30 tablet    . aspirin 81 MG tablet Take 1 tablet (81 mg total) by mouth daily.      Marland Kitchen atorvastatin (LIPITOR) 80 MG tablet Take 1 tablet (80 mg total) by mouth daily.  30 tablet  3  . betamethasone valerate ointment (VALISONE) 0.1 % Apply 1 application topically 2 (two) times daily as needed (for excema).      . carvedilol (COREG) 6.25 MG tablet Take 1 tablet (  6.25 mg total) by mouth 2 (two) times daily with a meal.  60 tablet  3  . glyBURIDE-metformin (GLUCOVANCE) 2.5-500 MG per tablet Take 2 tablets by mouth 2 (two) times daily with a meal.      . insulin NPH (HUMULIN N,NOVOLIN N) 100 UNIT/ML injection Inject 50 Units into the skin 2 (two) times daily.      Marland Kitchen losartan (COZAAR) 25 MG tablet Take 1 tablet (25 mg total) by mouth daily.  30 tablet  3  . nitroGLYCERIN (NITROSTAT) 0.4 MG SL tablet Place 1 tablet (0.4 mg total) under the tongue every 5 (five) minutes as needed for chest pain (up to 3 doses).  25 tablet  3  . prasugrel (EFFIENT) 10 MG TABS Take 1 tablet (10 mg total) by mouth daily.  30 tablet  6   Assessment: 66 YO male with weakness/SOB, possible PNA, for empiric antibiotics  Goal of Therapy:  Vancomycin trough level 15-20 mcg/ml  Plan:  Vancomycin 2500 mg IV now, then 1750 mg IV q12h Cefepime 2 g IV q8h  Eddie Candle 06/28/2012,12:20 AM

## 2012-06-28 NOTE — Progress Notes (Signed)
FMTS Daily Intern Progress Note  Subjective: Complains of throat pain and congestion today, denies difficulty laying flat to sleep or dysuria, though reports heavy urination since lasix yesterday. Able to tolerate liquid PO.   I have reviewed the patient's medications.  Objective Temp:  [97.5 F (36.4 C)-98.6 F (37 C)] 97.7 F (36.5 C) (06/03 1152) Pulse Rate:  [71-94] 75 (06/03 1230) Resp:  [9-34] 27 (06/03 1230) BP: (98-150)/(46-81) 129/81 mmHg (06/03 1150) SpO2:  [85 %-97 %] 91 % (06/03 1230) Weight:  [375 lb 7.1 oz (170.3 kg)] 375 lb 7.1 oz (170.3 kg) (06/03 0000)   Intake/Output Summary (Last 24 hours) at 06/28/12 1317 Last data filed at 06/28/12 1201  Gross per 24 hour  Intake    840 ml  Output   2275 ml  Net  -1435 ml    CBG (last 3)   Recent Labs  06/28/12 0033 06/28/12 0758 06/28/12 1146  GLUCAP 215* 157* 196*    General: NAD, lying in bed, morbidly obese HEENT: AT/[place], EOMI, sclera clear CV: Distant, 1+ bilateral radial pulse with regular rate and rhythm Pulm: Distant, faint crackles, normal effort, occasional cough Abd: Morbidly obese, soft Ext: Bilateral LE edema that is non-pitting; bilateral LE venous stasis hyperpigmentation, SCDs on bilaterally Neuro: Awake, alert, no focal deficits, normal speech  Labs and Imaging  Recent Labs Lab 06/24/12 1042 06/27/12 2033 06/27/12 2048 06/28/12 0435  WBC 7.3 10.6*  --  10.1  HGB 14.2 13.8 14.3 13.9  HCT 42.2 40.9 42.0 41.4  PLT 172 197  --  188     Recent Labs Lab 06/24/12 1042 06/27/12 2033 06/27/12 2048 06/28/12 0435  NA 138 134* 136 135  K 3.4* 3.8 3.9 3.7  CL 103 98 102 99  CO2 27 24  --  29  BUN 9 12 11 10   CREATININE 0.74 0.75 0.80 0.76  GLUCOSE 166* 242* 235* 169*  CALCIUM 8.6 8.9  --  8.8   CXR: Low volume, no acute findings EKG: NSR, question previous inferior infarct   Recent Labs Lab 06/23/12 0023 06/23/12 0644 06/24/12 1043 06/28/12 0435 06/28/12 1040  TROPONINI 19.86*  14.30* 4.10* <0.30 <0.30      Assessment and Plan [name] is a 67 y.o. year old male with CAD s/p posterior MI 5 days ago, morbid obesity, DM, CHF presenting with weakness, dyspnea and intermittent chest pain. Pro-BNP is elevated at 1900.   # Dyspnea: Concern for CHF exacerbation vs pneumonia. New oxygen requirement. CXR was of poor quality but did not show frank pulmonary edema or infiltrate. Pro-BNP and orthopnea go with CHF exacerbation but he has minimal LE edema. Subjective fevers and mild white count concerning for pneumonia, though no documented fever and 2-view CXR this AM with no infiltrate. - s/p lasix 40mg  IV x1 6/2, will not redose given no continued signs of fluid overload. Continue to monitor and reconsider if develops crackles or new oxygen requirement/SOB.  - started cefepime and vanc for possible HCAP 6/2 - will discontinue today given unlikely pneumonia with no fevers, SOB or findings on CXR. - continue home ARB and BB  - strict I&Os - Down net 1.4L - daily weights - weight 375.5 lbs from 386lbs on 5/30. - SLIV  - Dyspnea currently stable and not characteristic for PE, though risk factors include obesity and likely sedentary. D-dimer will likely be elevated even without clot in obese sedentary patient with recent cardiac cath. Will hold off on CTA unless patient desaturates.  #  Chest pain: Admitted 5/28 with inferior STEMI, had cath with successful aspiration thrombectomy and balloon angioplasty to the RCA, EF was normal on cath. Current chest pain is likely angina given history and presence with activity. Troponin has continued to decrease from previous hospitalization, now <0.3 x 2. - No active chest pain unless he moves  - monitor in step down on telemetry  - SL nitroglycerin prn - has not needed yet - morphine 1mg  q2hr prn  - will continue effient and aspirin for ACS - continue metoprolol  - risk stratification labs done last month except for TSH which is now  pending - Cardiology consulted with no further recommendations. - Chloraseptic spray for throat pain  # Weakness: May be related to current CHF exacerbation vs ?PNA. May also be related to recent MI. Other DDx includes electrolyte abnormalities (not present), infection (bacterial unlikely, possible viral illness), rheumatologic, malignancy, obesity hypoventilation, myopathy, anemia, thyroid disease).  - treat acute issues as above  - consult PT/OT for recs  - SW consulted for likely SNF placement - patient agreeable to this  - CK checked and WNL; F/u TSH - Consulted respiratory therapy for evaluation for CPAP in hospital  # Hypertension: Stable.  - continue home meds  - Hold ARB if BP gets too low.  # DM: A1c 8.0 on 06/24/12. On insulin NPH 50 units BID at home + metformin/glyburide  - will continue NPH at 25 units BID  - moderate SSI  - hold metformin/glyburide   FEN/GI: SLIV, carb-modified diet  Prophylaxis: SQ heparin  Disposition: admit to step down; d/c pending clinical improvement and PT/OT evaluation; will hopefully discharge to SNF   [name] Pager: [phone] 06/28/2012, 1:17 PM Family Practice Service Pager: [phone] Text pages welcome.  Can page through [email or web address], [place] [facility].

## 2012-06-28 NOTE — Progress Notes (Addendum)
Clinical Social Work Department CLINICAL SOCIAL WORK PLACEMENT NOTE 06/28/2012  Patient:  [name], [name]  Account Number:  [id number] Admit date:  06/27/2012  Clinical Social Worker:  Theresia Bough, Theresia Majors  Date/time:  06/28/2012 12:54 PM  Clinical Social Work is seeking post-discharge placement for this patient at the following level of care:   SKILLED NURSING   (*CSW will update this form in Epic as items are completed)   06/28/2012  Patient/family provided with [facility] Department of Clinical Social Work's list of facilities offering this level of care within the geographic area requested by the patient (or if unable, by the patient's family).  06/28/2012  Patient/family informed of their freedom to choose among providers that offer the needed level of care, that participate in Medicare, Medicaid or managed care program needed by the patient, have an available bed and are willing to accept the patient.  06/28/2012  Patient/family informed of [facility]' ownership interest in [facility], as well as of the fact that they are under no obligation to receive care at this facility.  PASARR submitted to EDS on 06/28/2012 PASARR number received from EDS on existing #  FL2 transmitted to all facilities in geographic area requested by pt/family on  06/28/2012 FL2 transmitted to all facilities within larger geographic area on   Patient informed that his/her managed care company has contracts with or will negotiate with  certain facilities, including the following:     Patient/family informed of bed offers received:  06/29/12 Patient chooses bed at [facility] Physician recommends and patient chooses bed at    Patient to be transferred to  on   Patient to be transferred to facility by   The following physician request were entered in Epic:   Additional Comments: Pt requesting placement at [facility] or [facility]. Theresia Bough, MSW,  Theresia Majors [phone]

## 2012-06-28 NOTE — Progress Notes (Signed)
Clinical Social Work Department BRIEF PSYCHOSOCIAL ASSESSMENT 06/28/2012  Patient:  [name], [name]     Account Number:  [id number]     Admit date:  06/27/2012  Clinical Social Worker:  Lourdes Sledge  Date/Time:  06/28/2012 11:06 AM  Referred by:  Physician  Date Referred:  06/28/2012 Referred for  SNF Placement   Other Referral:   Interview type:  Patient Other interview type:    PSYCHOSOCIAL DATA Living Status:  ALONE Admitted from facility:   Level of care:   Primary support name:  Abisai Deer [phone] Primary support relationship to patient:  SPOUSE Degree of support available:   Pt has limited support as spouse is at a nursing home and pt lives alone.    CURRENT CONCERNS Current Concerns  Post-Acute Placement   Other Concerns:    SOCIAL WORK ASSESSMENT / PLAN CSW received a referral for SNF placement.    CSW visited pt room and introduced herself and role. CSW explored pt living situation and amount of support pt has available at home. Pt stated he lives alone at home and has very limited support due to his wife being in a nursing home. Pt stated he is also unable to clean his home, his bed is broken and he is having a difficult time caring for himself. CSW provided active listening and validated pt feelings of how difficult this is for pt. CSW explored whether pt would be a greeable to SNF placement until pt is able to get stronger and obtain needed assistance at home. Pt stated he would be agreeable to SNF placement preferrably at [facility] or [facility].    CSW to complete an FL2 and do a SNF search.   Assessment/plan status:  Psychosocial Support/Ongoing Assessment of Needs Other assessment/ plan:   Information/referral to community resources:   CSW provided pt with a SNF list and CSW contact information.    PATIENT'S/FAMILY'S RESPONSE TO PLAN OF CARE: Pt laying in bed alert, oriented and agreeable to SNF placement. Pt requested to take  his cat with him however CSW informed him that unfortunately no pets are allowed at a facility, pt remains agreeable to placement.       Theresia Bough, MSW, Theresia Majors [phone]

## 2012-06-28 NOTE — Consult Note (Signed)
CARDIOLOGY CONSULT NOTE  Patient ID: [name] MRN: [id number], DOB/AGE: [birth year]   Admit date: 06/27/2012 Date of Consult: 06/28/2012  Primary Physician: Barbette Reichmann, MD Primary Cardiologist: Judie Petit. Kirke Corin, MD  Pt. Profile  67 y/o male with recent inf stemi in the setting of effient noncompliance who was readmitted overnight 2/2 malaise, n, v, dysphagia since Sunday.  Problem List  Past Medical History  Diagnosis Date  . Hypertension   . Hyperlipidemia   . Morbid obesity   . NSVT (nonsustained ventricular tachycardia)     a. 04/2012 post [name]; normal LVF   . Physical deconditioning     requiring outpatient PT  . Osteoarthritis   . Lower extremity cellulitis   . Peripheral edema   . Diabetes mellitus      Insulin-dependent  . Chronic knee pain   . Anemia, unspecified   . Ischemic cardiomyopathy     a. 4.2014 EF 40% by LV gram.  . Coronary artery disease     a. s/p anterior STEMI 07/13/10: tx with Promus DES to distal LM; s/p POBA to distal LAD;  b. 04/2012 NSTEMI/Cath/PCI: LM 20%, LAD 20% p, 70m, 100d, D1 60-70ost, D2 nl, D3 nl, LCX mid dzs, OM1/2/3 min irregs, RCA dom 60ost, 90p, 88m (3.5x38 & 4.0x24 Promus DES'), EF 40%, inf HK. c. 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  Marland Kitchen Anxiety   . Depression   . Sleep apnea   . CHF (congestive heart failure)     Past Surgical History  Procedure Laterality Date  . Coronary stent placement      drug-eluting to the distal left main and PTCA of the distal left anterior descending.  . Skin graft Right     history  . Knee surgery Left   . Cataract surgery    . Cardiac catheterization    . Cardiac catheterization      x2 stents;[facility]    Allergies  No Known Allergies  HPI   67 y/o male with the above complex problem list.  He was hospitalized in April following NSTEMI @ which time cath showed occlusive dzs in the RCA that was successfully treated with 2  Promus DES'.  He was d/c'd on Effient but at some point came off (he says 2 days prior to his May admission).  On 5/28, he came in as an acute inf STEMI.  His RCA was thrombotic and occluded.  This was successfully treated with PTCA and thrombectomy with bail out integrilin.  He was reloaded on effient and d/c'd home this past Saturday 5/31.  He was again given a 30 day Effient card and this time he had it filled.  On Sunday morning, he awoke feeling general malaise and fatigue.  This progressed to include nausea and vomiting, though he believes he kept his meds down.  By Sunday evening he was experiencing fever and chills associated with dull right sided chest discomfort and dysphagia/sore throat.  Yesterday, his Ss persisted and he was so weak that he couldn't get out of his hospital bed @ home.  He called EMS and they assisted him out of bed and brought him to [facility] ED.  There, CXR/ECG/Labs were unrevealing.  He was admitted by internal medicine.  He has since r/o for [name].  He is currently on abx and was treated with 1 dose of IV lasix.  He reports mild right chest discomfort that has been constant since  Sunday and is worth with deep breathing and coughing.  His biggest complaint at this point is a sore throat.  At rest, he has no nausea.  He is clear that his chest pain is different from prior angina.  Inpatient Medications  . aspirin  324 mg Oral Once  . aspirin EC  81 mg Oral Daily  . atorvastatin  80 mg Oral q1800  . carvedilol  6.25 mg Oral BID WC  . ceFEPime (MAXIPIME) IV  2 g Intravenous Q8H  . heparin  5,000 Units Subcutaneous Q8H  . insulin aspart  0-15 Units Subcutaneous TID WC  . insulin NPH  25 Units Subcutaneous QAC breakfast  . insulin NPH  25 Units Subcutaneous QHS  . losartan  25 mg Oral Daily  . prasugrel  10 mg Oral Daily  . sodium chloride  3 mL Intravenous Q12H  . sodium chloride  3 mL Intravenous Q12H  . vancomycin  1,750 mg Intravenous Q12H   Family History Family  History  Problem Relation Age of Onset  . Heart attack Brother 60    died  . Coronary artery disease      strongly positive family hx of  . Heart attack Father     Social History History   Social History  . Marital Status: Married    Spouse Name: [name]    Number of Children: 2  . Years of Education: [name]   Occupational History  . Not on file.   Social History Main Topics  . Smoking status: Never Smoker   . Smokeless tobacco: Never Used  . Alcohol Use: No  . Drug Use: No  . Sexually Active: Not Currently   Other Topics Concern  . Not on file   Social History Narrative  . Pt lives by himself in [place].    Review of Systems  General:  +++ chills, +++ fever.  No night sweats or weight changes.  Cardiovascular:  +++ right sided chest pain.  Chronic DOE - no worse than usual.  Chronic mild LE edema/  No orthopnea, palpitations, paroxysmal nocturnal dyspnea. Dermatological: No rash, lesions/masses Respiratory: +++ cough, chronic dyspnea Urologic: No hematuria, dysuria Abdominal:   +++ nausea, vomiting.  No diarrhea, bright red blood per rectum, melena, or hematemesis Neurologic:  No visual changes, +++ wkns, no changes in mental status. All other systems reviewed and are otherwise negative except as noted above.  Physical Exam  Blood pressure 105/52, pulse 71, temperature 97.5 F (36.4 C), temperature source Oral, resp. rate 21, height 6' (1.829 m), weight 375 lb 7.1 oz (170.3 kg), SpO2 91.00%.  General: Pleasant, NAD Psych: Normal affect. Neuro: Alert and oriented X 3. Moves all extremities spontaneously. HEENT: Normal  Neck: Supple without bruits.  Girth prevents adequate eval of neck veins. Lungs:  Resp regular and unlabored, CTA. Heart: RRR - distant, no obvious s3, s4, or murmurs. Abdomen: Soft, non-tender, non-distended, BS + x 4.  Extremities: No clubbing, cyanosis.  Trace bilat le edema. DP/PT/Radials 1+ and equal bilaterally.  Labs   Recent Labs   06/28/12 0435  TROPONINI <0.30   Lab Results  Component Value Date   WBC 10.1 06/28/2012   HGB 13.9 06/28/2012   HCT 41.4 06/28/2012   MCV 79.2 06/28/2012   PLT 188 06/28/2012    Recent Labs Lab 06/28/12 0435  NA 135  K 3.7  CL 99  CO2 29  BUN 10  CREATININE 0.76  CALCIUM 8.8  PROT 6.6  BILITOT 0.7  ALKPHOS 84  ALT 17  AST 15  GLUCOSE 169*   Lab Results  Component Value Date   CHOL 140 06/23/2012   HDL 32* 06/23/2012   LDLCALC 86 06/23/2012   TRIG 108 06/23/2012   Radiology/Studies  X-ray Chest Pa And Lateral   06/28/2012   *RADIOLOGY REPORT*  Clinical Data: Dyspnea.  Chest pain.  CHEST - 2 VIEW    IMPRESSION: Low lung volumes.  No acute findings.   Original Report Authenticated By: Myles Rosenthal, M.D.   Dg Chest Port 1 View  06/27/2012   *RADIOLOGY REPORT*  Clinical Data: Nausea, vomiting and weakness.  History of blood clots.  PORTABLE CHEST - 1 VIEW   IMPRESSION: 1.  Low lung volumes without radiographic evidence of acute cardiopulmonary disease.   Original Report Authenticated By: Trudie Reed, M.D.   ECG  Sinus rhythm, 84, inf infarct, ant infarct, no acute st/t changes.  ASSESSMENT AND PLAN  1.  Malaise/fevers/chills:  Suspect viral illness.  No evidence of cardiac ischemia to account for Ss.  Mgmt per IM.  2.  CAD:  S/p recent inf stemi in setting of previous DES' and effient noncompliance.  He reports that he has been taking his asa/effient since d/c on Saturday.  As above, no objective evidence of ischemia.  He c/o mild right sided chest pain that is different from prior angina and is worse with deep breathing/coughing.  Cont asa, bb, effient, statin.  No indication for further cardiac w/u @ this time.  3.  Dysphagia/pharyngitis:  Suspect part of #1.  Per IM.  4.  HTN:  Stable.  5.  HL:  Cont statin.  6.  ICM:  Volume appears to be stable though his size makes it difficult to judge.  He denies any worsening of dyspnea/orthopnea/pnd.  Cont bb/arb.  7.  DM:  Per  IM.  Signed, Nicolasa Ducking, NP 06/28/2012, 8:50 AM

## 2012-06-29 LAB — GLUCOSE, CAPILLARY: Glucose-Capillary: 104 mg/dL — ABNORMAL HIGH (ref 70–99)

## 2012-06-29 MED ORDER — ONDANSETRON HCL 4 MG PO TABS: 4.0000 mg | ORAL_TABLET | Freq: Four times a day (QID) | ORAL | Status: DC | PRN

## 2012-06-29 MED ORDER — PHENOL 1.4 % MT LIQD: 1.0000 | OROMUCOSAL | Status: DC | PRN

## 2012-06-29 MED ORDER — ASPIRIN 81 MG PO CHEW: 324.0000 mg | CHEWABLE_TABLET | Freq: Once | ORAL | Status: DC

## 2012-06-29 MED ORDER — INSULIN NPH (HUMAN) (ISOPHANE) 100 UNIT/ML ~~LOC~~ SUSP: 25.0000 [IU] | Freq: Every day | SUBCUTANEOUS | Status: DC

## 2012-06-29 MED ORDER — INSULIN ASPART 100 UNIT/ML ~~LOC~~ SOLN: 0.0000 [IU] | Freq: Three times a day (TID) | SUBCUTANEOUS | Status: DC

## 2012-06-29 NOTE — Discharge Instructions (Signed)
Patient is being discharged to SNF. 

## 2012-06-29 NOTE — Progress Notes (Signed)
FMTS Daily Intern Progress Note  Subjective: Feels a little better, with no more throat pain, no chest pain, and no SOB. Hungry and tolerating PO, about to eat breakfast. No dysuria. Interested in SNF and in CPAP. Thinks he snores at night. O2 placed when snoring and O2 dropped to 70s briefly, immediately improved.  I have reviewed the patient's medications. PRN tylenol at 2330.  Objective Temp:  [97.4 F (36.3 C)-98.7 F (37.1 C)] 97.4 F (36.3 C) (06/04 0405) Pulse Rate:  [62-94] 62 (06/04 0405) Resp:  [17-34] 26 (06/04 0405) BP: (105-133)/(47-81) 125/59 mmHg (06/04 0405) SpO2:  [91 %-98 %] 98 % (06/04 0405) Weight:  [375 lb 7.1 oz (170.3 kg)-383 lb 2.6 oz (173.8 kg)] 383 lb 2.6 oz (173.8 kg) (06/04 0500) On oxygen last night for desaturation briefly into 70s which improved with O2 by [place]. Pt removed this morning at 8am and is feeling fine.   Intake/Output Summary (Last 24 hours) at 06/29/12 1610 Last data filed at 06/29/12 0300  Gross per 24 hour  Intake      0 ml  Output   1200 ml  Net  -1200 ml    CBG (last 3)   Recent Labs  06/28/12 1146 06/28/12 1705 06/28/12 2134  GLUCAP 196* 305* 173*    General: NAD, lying in bed, morbidly obese HEENT: AT/[place], EOMI, sclera clear CV: Distant, 1+ bilateral radial pulse with regular rate and rhythm Pulm: Distant with movement heard at bilateral apices, normal effort, occasional cough Abd: Morbidly obese, soft, nontender, nondistended Ext: Bilateral LE edema that is non-pitting; bilateral LE venous stasis hyperpigmentation Neuro: Awake, alert, no focal deficits, normal speech  Labs and Imaging  Recent Labs Lab 06/24/12 1042 06/27/12 2033 06/27/12 2048 06/28/12 0435  WBC 7.3 10.6*  --  10.1  HGB 14.2 13.8 14.3 13.9  HCT 42.2 40.9 42.0 41.4  PLT 172 197  --  188     Recent Labs Lab 06/24/12 1042 06/27/12 2033 06/27/12 2048 06/28/12 0435  NA 138 134* 136 135  K 3.4* 3.8 3.9 3.7  CL 103 98 102 99  CO2 27 24  --  29   BUN 9 12 11 10   CREATININE 0.74 0.75 0.80 0.76  GLUCOSE 166* 242* 235* 169*  CALCIUM 8.6 8.9  --  8.8   CXR: Low volume, no acute findings EKG: NSR, question previous inferior infarct    Recent Labs Lab 06/23/12 0023 06/23/12 0644 06/24/12 1043 06/28/12 0435 06/28/12 1040  TROPONINI 19.86* 14.30* 4.10* <0.30 <0.30   CK 51 TSH 2.27  Assessment and Plan  [name] is a 67 y.o. year old male with CAD s/p posterior MI 5/28, morbid obesity, DM, and CHF presenting with weakness, dyspnea and intermittent chest pain. Pro-BNP is elevated at 1900.   # Dyspnea: Concern for CHF exacerbation vs pneumonia. New oxygen requirement. CXR was poor quality but did not show frank pulmonary edema or infiltrate. Pro-BNP and orthopnea support CHF exacerbation but he has minimal pitting LE edema. Subjective fevers and mild white count concerning for pneumonia, though no documented fever and 2-view CXR 6/3 with no infiltrate. - s/p lasix 40mg  IV x1 6/2, will not redose given no continued signs of fluid overload. Continue to monitor and reconsider if develops crackles or new oxygen requirement/SOB.  - started cefepime and vanc for possible HCAP 6/2 - discontinued 6/3 given unlikely pneumonia with no fevers, SOB or findings on CXR. - continue home ARB and BB  - strict I&Os -  Down net 2.6L - daily weights - weight 383lbs from 375.5 lbs from 386lbs on 5/30. - SLIV  - Dyspnea currently stable and not characteristic for PE, though risk factors include obesity and likely sedentary. D-dimer will likely be elevated even without clot in obese sedentary patient with recent cardiac cath. Will hold off on CTA unless patient desaturates with chest pain or SOB.   # Chest pain: Admitted 5/28 with inferior STEMI, had cath with successful aspiration thrombectomy and balloon angioplasty to the RCA, EF was normal on cath. Current chest pain is likely angina given history and presence with activity. Troponin has continued to  decrease from previous hospitalization, now <0.3 x 2. - No active chest pain unless he moves  - Transfer out of stepdown to regular telemetry bed today. - SL nitroglycerin prn - has not needed yet - morphine 1mg  q2hr prn - has not needed in 24 hours. - will continue effient and aspirin for ACS - continue metoprolol  - risk stratification labs done last month, TSH done this admission WNL - Cardiology consulted with no further recommendations. - Chloraseptic spray for throat pain helped  # Weakness: May be related to current CHF exacerbation vs ?PNA. May also be related to recent MI. Other DDx includes electrolyte abnormalities (not present), infection (bacterial unlikely, possible viral illness), rheumatologic, malignancy (no unintentional wt loss or gain or night sweats), obesity hypoventilation (pt does snore nightly), myopathy (CK normal, no specific muscle pain or weakness), anemia, thyroid disease (TSH normal)). Pt is interested in CPAP. - treat acute issues as above  - F/u consult to PT/OT for recs  - SW consulted for likely SNF placement - patient agreeable to this - f/u recs - Consulted respiratory therapy for evaluation for CPAP in hospital  # Hypertension: Stable.  - continue home meds  - Hold ARB if BP gets too low.  # DM: A1c 8.0 on 06/24/12. On insulin NPH 50 units BID at home + metformin/glyburide  - will continue NPH at 25 units BID  - moderate SSI  - hold metformin/glyburide   FEN/GI: SLIV, carb-modified diet  Prophylaxis: SQ heparin  Disposition: Transfer out of stepdown unit today; clinically appears improved; discharge pending respiratory and PT/OT evaluations; will hopefully discharge to SNF   [name] Pager: [phone] 06/29/2012, 7:02 AM Family Practice Service Pager: [phone] Text pages welcome.  Can page through [email or web address], [place] [facility].

## 2012-06-29 NOTE — Progress Notes (Signed)
PGY-1 Update Note  Spoke with CSW. Pt has a SNF bed but they cannot accept him today. Will plan for D/C tomorrow per Dr. Lucienne Minks note, today.  Bobbye Morton, MD PGY-1, [facility] Family Medicine FPTS Intern pager: [phone]

## 2012-06-29 NOTE — Progress Notes (Signed)
Family Medicine Teaching Service Attending Note  I interviewed and examined patient [name] and reviewed their tests and x-rays.  I discussed with Dr. Benjamin Stain and reviewed their note for today.  I agree with their assessment and plan.     Additionally  Feeling much improved No signs of bacterial infection Stable for SNF

## 2012-06-29 NOTE — ED Provider Notes (Signed)
I saw and evaluated the patient, reviewed the resident's note and I agree with the findings and plan. I personally evaluated the ECG and agree with the interpretation of the resident   Atypical chest pain however with some shortness of breath.  Patient will need a cardiac rule out in the hospital. Also is struggling to care for himself. Placement may be required  Lyanne Co, MD 06/29/12 [phone]

## 2012-06-29 NOTE — Discharge Summary (Signed)
Discharge Summary 06/30/2012 9:23 AM  [name] DOB: 1945-02-22 MRN: [id number]  Date of Admission: 06/27/2012 Date of Discharge: 06/30/2012   PCP: Barbette Reichmann, MD Consultants: Cardiology  Reason for Admission: Dyspnea, intermittent chest pain with elevated proBNP  Discharge Diagnosis Primary 1. Obesity hypoventilation syndrome vs sleep apnea likely Secondary 1. Fatigue 2. Viral illness 3. Recent STEMI with chest pain 4. HTN 5. DM  Hospital Course: [name] is a 67 y.o. year old male with CAD s/p posterior MI 5/28, morbid obesity, DM, and CHF presenting with weakness, dyspnea and intermittent chest pain. Pro-BNP is elevated at 1900.   # Obesity hypoventilation syndrome vs sleep apnea, likely: With dyspnea and chest pain, there was concern on admission for CHF exacerbation, pneumonia, and cardiac cause, especially given recent admission 5/28 with inferior STEMI. CXR was poor quality but showed no infiltratte or frank pulmonary edema, and was stable on repeat. ProBNP was elevated to support CHF, but patient had minimal pitting LE edema. Pt admitted to stepdown for workup. Troponin continued trending downward and was negative x 2. With subjective fevers and pneumonia, pt had no documented fever and 2-view CXR was WNL. Pt received one dose of IV lasix which was not redosed. Diuresed about 2.6L this hospital course. Weight likely unreliable in obese male. Started cefipime and vancomycin which was discontinued on day 2. Further clarification revealed patient snores nightly and while hospitalized had O2 desaturations at night time requiring O2 by [place], when during the day he required no oxygen by discharge. Dyspnea was not characteristic for PE though risk factors of obesity and likely sedentary nature were present. Did not order d-dimer given recent cardiac cath, and held off on CTA chest unless pt desaturated with chest pain or SOB so did not order this. Was transferred out of stepdown prior  to discharge. Unable to perform sleep study in the hospital but strongly recommend that patient get split sleep study, for which he can follow-up with pulmonology to get kit and use this in SNF, and then bring it back to clinic for interpretation. He may greatly benefit from CPAP, though this was unable to be performed in hospital with no previous evaluation.  # Fatigue - Thought due to viral illness vs undiagnosed sleep apnea/obesity hypoventilation syndrome. Pt denied focal deficits, unintentional weight loss/night sweats, or thyroid symptoms, making neurologic process, malignancy, and thyroid dysfunction unlikely. TSH and CK were WNL. He reported snoring (see above) and had 1-2 episodes of oxygen desaturation while asleep at night, improved with O2 by [place]. Attempted to place CPAP but patient did not get. He will need a sleep study but cannot get as an inpatient. Recommend outpatient pulmonology visit for sleep study kit, which he can use in SNF and return to clinic for evaluation. Pt requested evaluation for SNF, and PT/OT consulted and PT recommended SNF placement for help with ADLs/IADLs.   # Viral illness - Pt arrived with neck / throat pain and chest congestion / mild cough that was greatly relieved by day 2 after receiving chloraseptic spray. CHF exacerbation and pneumonia less likely per above. He was afebrile this hospitalization. He had mild shortnes of breath night times with oxygen desaturations, which resolved with oxygen administration by [place] at 2L and did not require any support during daytime. This was thought to be due most likely to obesity hypoventilation / sleep apnea as yet undiagnosed, given lack of CP/SOB by discharge and long h/o snoring per pt, along with morbidly obese habitus.  #  Recent STEMI with chest pain - Chest pain seemed anginal on admission. Prior hospitalization, he had catheterization with successful aspiration thrombectomy and balloon angioplasty with EF normal on cath. In  the ED, patient had new O2 requirement and chest pain was partially relieved with SL nitroglycerin and GI cocktail. Continued effient and aspirin for ACS, along with metoprolol. Risk stratification labs were done prior month, but ordered TSH which was normal. Cardiology was consulted with no further recommendations. Through hospital course, pain became more when he turned in bed and then by day 3 was resolved.  # HTN - Stable. Continued home ARB and beta blocker with no issues.  # DM - A1c 8.0 on 06/24/12. On insulin NPH 50 units BID at home + metformin/glyburide. Continued NPH 25 units BID, moderate SSI, and held metformin/glyburide. Follow up at [facility] for med management.   Discharge day physical exam: Filed Vitals:   06/30/12 0649  BP: 119/74  Pulse: 68  Temp: 98.1 F (36.7 C)  Resp: 18  General: NAD, sitting on bedside, morbidly obese  HEENT: AT/[place], EOMI, sclera clear  CV: Distant, 1+ bilateral radial pulse with regular rate and rhythm  Pulm: Distant but clear throughout, normal effort, occasional cough  Abd: Morbidly obese, soft, nontender, nondistended  Ext: Bilateral LE edema that is non-pitting; bilateral LE venous stasis hyperpigmentation  Neuro: Awake, alert, no focal deficits, normal speech, normal core strength  Procedures: None this hospitalization  Discharge Medications   Medication List    STOP taking these medications       aspirin 81 MG tablet     glyBURIDE-metformin 2.5-500 MG per tablet  Commonly known as:  GLUCOVANCE      TAKE these medications       acetaminophen 500 MG tablet  Commonly known as:  TYLENOL  Take 2 tablets (1,000 mg total) by mouth every 6 (six) hours as needed for pain (knee and chest pain).     aspirin 81 MG chewable tablet  Chew 4 tablets (324 mg total) by mouth once.     atorvastatin 80 MG tablet  Commonly known as:  LIPITOR  Take 1 tablet (80 mg total) by mouth daily.     betamethasone valerate ointment 0.1 %  Commonly known as:   VALISONE  Apply 1 application topically 2 (two) times daily as needed (for excema).     carvedilol 6.25 MG tablet  Commonly known as:  COREG  Take 1 tablet (6.25 mg total) by mouth 2 (two) times daily with a meal.     insulin aspart 100 UNIT/ML injection  Commonly known as:  novoLOG  Inject 0-15 Units into the skin 3 (three) times daily with meals.     insulin NPH 100 UNIT/ML injection  Commonly known as:  HUMULIN N,NOVOLIN N  Inject 25 Units into the skin daily before breakfast.     insulin NPH 100 UNIT/ML injection  Commonly known as:  HUMULIN N,NOVOLIN N  Inject 25 Units into the skin at bedtime.     losartan 25 MG tablet  Commonly known as:  COZAAR  Take 1 tablet (25 mg total) by mouth daily.     nitroGLYCERIN 0.4 MG SL tablet  Commonly known as:  NITROSTAT  Place 1 tablet (0.4 mg total) under the tongue every 5 (five) minutes as needed for chest pain (up to 3 doses).     ondansetron 4 MG tablet  Commonly known as:  ZOFRAN  Take 1 tablet (4 mg total) by mouth every 6 (  six) hours as needed for nausea.     phenol 1.4 % Liqd  Commonly known as:  CHLORASEPTIC  Use as directed 1 spray in the mouth or throat as needed.     prasugrel 10 MG Tabs  Commonly known as:  EFFIENT  Take 1 tablet (10 mg total) by mouth daily.        Pertinent Hospital Labs Recent Labs  Lab  06/24/12 1042  06/27/12 2033  06/27/12 2048  06/28/12 0435   WBC  7.3  10.6*  --  10.1   HGB  14.2  13.8  14.3  13.9   HCT  42.2  40.9  42.0  41.4   PLT  172  197  --  188     Recent Labs  Lab  06/24/12 1042  06/27/12 2033  06/27/12 2048  06/28/12 0435   NA  138  134*  136  135   K  3.4*  3.8  3.9  3.7   CL  103  98  102  99   CO2  27  24  --  29   BUN  9  12  11  10    CREATININE  0.74  0.75  0.80  0.76   GLUCOSE  166*  242*  235*  169*   CALCIUM  8.6  8.9  --  8.8    CXR: Low volume, no acute findings  EKG: NSR, question previous inferior infarct   Recent Labs  Lab  06/23/12 0023   06/23/12 0644  06/24/12 1043  06/28/12 0435  06/28/12 1040   TROPONINI  19.86*  14.30*  4.10*  <0.30  <0.30    CK 51  TSH 2.27   Recent Labs Lab 06/29/12 0815 06/29/12 1115 06/29/12 1615 06/30/12 0011 06/30/12 0648  GLUCAP 104* 153* 167* 168* 150*    Discharge instructions: Per AVS  Condition at discharge: stable  Disposition: To Skilled Nursing Facility [facility]  Pending Tests: None  Follow up: Follow-up Information   Follow up with Physician at the nursing home for medical care.      Follow up with Pulmonology clinic.      Follow up with Cardiologist as scheduled at last hospitalization.      Follow up Issues:  - Sleep study kit from pulmonologist at SNF, to be returned to pulmnologist for evaluation. - F/u blood glucose    [name] 06/30/2012 9:23 AM PGY-1 Family Medicine Teaching Service Service Pager: [phone] Text pages welcome.  Can page through [email or web address], [place] [facility].

## 2012-06-29 NOTE — Progress Notes (Signed)
Pt arrived from 3300 per bed. VS done placed on tele monitor Oriented to room.

## 2012-06-29 NOTE — Progress Notes (Signed)
Physical Therapy Treatment Patient Details Name: [name] MRN: [id number] DOB: Jun 10, 1945 Today's Date: 06/29/2012 Time: 8657-8469 PT Time Calculation (min): 28 min  PT Assessment / Plan / Recommendation Comments on Treatment Session  67 y.o. male admitted to [facility] for weakness and SOB.  Dx with CHF exacerbation vs PNA.  He is very deconditioned from multiple recent hospital stays.  He is appropriate for SNF for rehab before going home.      Follow Up Recommendations  SNF     Does the patient have the potential to tolerate intense rehabilitation    NA  Barriers to Discharge   none      Equipment Recommendations  None recommended by PT    Recommendations for Other Services   none  Frequency Min 2X/week   Plan Discharge plan remains appropriate;Frequency needs to be updated    Precautions / Restrictions Precautions Precautions: Fall Restrictions Weight Bearing Restrictions: No   Pertinent Vitals/Pain See vitals flow sheet. O2 stable on RA despite 2/4 DOE with gait/mobility    Mobility  Bed Mobility Supine to Sit: 5: Supervision;With rails;HOB elevated Sitting - Scoot to Edge of Bed: 5: Supervision;With rail Details for Bed Mobility Assistance: Pt using momentum to get to EOB.  Pt reports he has a bed at home that elevates.   Transfers Transfers: Sit to Stand;Stand to Sit Sit to Stand: 4: Min guard;With upper extremity assist;With armrests;From bed Stand to Sit: With armrests;With upper extremity assist;To chair/3-in-1;To toilet;4: Min guard Details for Transfer Assistance: min guard for safety and to help stabilize RW (pt using it to pull up from sitting.  Multiple attempts needed to be successful.   Ambulation/Gait Ambulation/Gait Assistance: 5: Supervision Ambulation Distance (Feet): 15 Feet Assistive device: Rolling walker Ambulation/Gait Assistance Details: to the bathroom and back to the recliner chair (lunch was waiting on him).  Flexed posture, shuffling pattern.   Pt demonstrated for 1-2 steps why he cannot walk without RW (very unsteady without that bil upper extremity support).   Gait Pattern: Step-through pattern;Shuffle;Trunk flexed Gait velocity: less than 1.8 ft/sec indicating risk for recurrent falls.   General Gait Details: DOE seems better today.  Still coughing throughout session, but DOE 2/4.  O2 sats remined in the mid to upper 90s on RA.        PT Goals Acute Rehab PT Goals PT Goal: Supine/Side to Sit - Progress: Progressing toward goal PT Goal: Sit to Stand - Progress: Progressing toward goal PT Goal: Ambulate - Progress: Progressing toward goal  Visit Information  Last PT Received On: 06/29/12 Assistance Needed: +1 PT/OT Co-Evaluation/Treatment: Yes    Subjective Data  Subjective: Pt was reluctatnt to get OOB due to feeling weak, but was glad he did after doing it.  "I feel better now" Pt's biggest difficulty at home is keeping his yard and his house managed.  He has a hard time  Patient Stated Goal: To go to rehab    Cognition  Cognition Arousal/Alertness: Awake/alert Behavior During Therapy: WFL for tasks assessed/performed Overall Cognitive Status: Within Functional Limits for tasks assessed    Balance  Static Standing Balance Static Standing - Balance Support: Bilateral upper extremity supported Static Standing - Level of Assistance: 5: Stand by assistance  End of Session PT - End of Session Activity Tolerance: Patient limited by fatigue Patient left: in chair;with call bell/phone within reach     [place] B. Johnay Mano, PT, DPT [phone]   06/29/2012, 12:54 PM

## 2012-06-29 NOTE — Progress Notes (Signed)
CSW spoke with Lambert Mody- Admissions Director [facility] - [facility] SNF. Bed offer in place for patient and she will have a vacancy tomorrow. This is patient's placement choice. MD is aware. CSW will facilitate d/c in the a.m.  Lorri Frederick. [place]  [phone]

## 2012-06-29 NOTE — Progress Notes (Addendum)
IV infiltrated, and dc'd intact.  Site unremarkable but slightly swollen.  Feels better now that IV dc'd.  Continue to monitor.  IV therapy notified as patient is difficult to restart IV.

## 2012-06-29 NOTE — Progress Notes (Signed)
Pt jumped and pulled arm back with needle puncture, he refuses to be stuck again, RN notified of pt's request. [name]

## 2012-06-29 NOTE — Evaluation (Signed)
Occupational Therapy Evaluation Patient Details Name: [name] MRN: [id number] DOB: 04-May-1945 Today's Date: 06/29/2012 Time: 4098-1191 OT Time Calculation (min): 27 min  OT Assessment / Plan / Recommendation Clinical Impression  Pt admitted weakness and SOB.  Dx with CHF exacerbation vs PNA.   Will continue to follow acutely in order to address below problem list. Recommending [facility] for d/c planning to further progress rehab before returning home.    OT Assessment  Patient needs continued OT Services    Follow Up Recommendations  SNF;Supervision/Assistance - 24 hour    Barriers to Discharge Decreased caregiver support    Equipment Recommendations  None recommended by OT    Recommendations for Other Services    Frequency  Min 2X/week    Precautions / Restrictions Precautions Precautions: Fall   Pertinent Vitals/Pain See vitals    ADL  Eating/Feeding: Performed;Independent Where Assessed - Eating/Feeding: Chair Grooming: Performed;Wash/dry hands;Set up Where Assessed - Grooming: Unsupported sitting Upper Body Bathing: Simulated;Set up Where Assessed - Upper Body Bathing: Unsupported sitting Lower Body Bathing: Simulated;Minimal assistance Where Assessed - Lower Body Bathing: Supported sit to stand Upper Body Dressing: Simulated;Set up Where Assessed - Upper Body Dressing: Unsupported sitting Lower Body Dressing: Performed;Minimal assistance Where Assessed - Lower Body Dressing: Supported sit to stand Toilet Transfer: Performed;Min guard Statistician Method:  (ambulating) Acupuncturist: Comfort height toilet;Grab bars Toileting - Clothing Manipulation and Hygiene: Performed;+1 Total assistance;Min guard Where Assessed - Engineer, mining and Hygiene: Standing Equipment Used: Rolling walker Transfers/Ambulation Related to ADLs: Min guard with RW.  Pt leans forward onto elbows on RW while ambulating. ADL Comments: min guard while performing  clothing manipulation in standing at toilet. +1 total assist to perform back peri care/hygiene while standing. Assist to don socks over toes but pt able to finish pulling socks up over heels while he sat EOB.    OT Diagnosis: Generalized weakness  OT Problem List: Decreased strength;Decreased activity tolerance;Obesity;Decreased knowledge of use of DME or AE OT Treatment Interventions: Self-care/ADL training;DME and/or AE instruction;Therapeutic activities;Patient/family education;Energy conservation   OT Goals Acute Rehab OT Goals OT Goal Formulation: With patient Time For Goal Achievement: 07/06/12 Potential to Achieve Goals: Good ADL Goals Pt Will Perform Grooming: with modified independence;Standing at sink ADL Goal: Grooming - Progress: Goal set today Pt Will Perform Lower Body Bathing: with modified independence;Sit to stand from chair;Sit to stand from bed;with adaptive equipment ADL Goal: Lower Body Bathing - Progress: Goal set today Pt Will Perform Lower Body Dressing: with modified independence;Sit to stand from bed;Sit to stand from chair;with adaptive equipment ADL Goal: Lower Body Dressing - Progress: Goal set today Pt Will Transfer to Toilet: with modified independence;Ambulation;with DME;Comfort height toilet ADL Goal: Toilet Transfer - Progress: Goal set today Pt Will Perform Toileting - Clothing Manipulation: with modified independence;Standing ADL Goal: Toileting - Clothing Manipulation - Progress: Goal set today Pt Will Perform Toileting - Hygiene: with modified independence;Sit to stand from 3-in-1/toilet ADL Goal: Toileting - Hygiene - Progress: Goal set today  Visit Information  Last OT Received On: 06/29/12 Assistance Needed: +1 PT/OT Co-Evaluation/Treatment: Yes    Subjective Data      Prior Functioning     Home Living Lives With: Alone Available Help at Discharge: Family;Available PRN/intermittently Type of Home: House Home Access: Stairs to  enter;Ramped entrance Home Layout: One level Bathroom Shower/Tub: Health visitor: Handicapped height Home Adaptive Equipment: Built-in shower seat;Grab bars in shower;Grab bars around toilet;Walker - four wheeled;Crutches Additional Comments:  states daughter may come down from [place] soon to visit, but doesn't know how long she will stay Prior Function Level of Independence: Independent with assistive device(s) Driving: Yes Vocation: Retired Musician: No difficulties         Vision/Perception     Copywriter, advertising Arousal/Alertness: Awake/alert Behavior During Therapy: WFL for tasks assessed/performed Overall Cognitive Status: Within Functional Limits for tasks assessed    Extremity/Trunk Assessment Right Upper Extremity Assessment RUE ROM/Strength/Tone: [facility] for tasks assessed Left Upper Extremity Assessment LUE ROM/Strength/Tone: WFL for tasks assessed     Mobility Bed Mobility Bed Mobility: Supine to Sit;Sit to Supine;Sitting - Scoot to Edge of Bed;Scooting to [facility] Supine to Sit: 5: Supervision;With rails;HOB elevated Sitting - Scoot to Edge of Bed: 5: Supervision;With rail Details for Bed Mobility Assistance: Pt using momentum to get to EOB.  Pt reports he has a bed at home that elevates.   Transfers Transfers: Sit to Stand;Stand to Sit Sit to Stand: 4: Min guard;With upper extremity assist;From toilet;From bed Stand to Sit: 4: Min guard;To chair/3-in-1;To toilet;With upper extremity assist Details for Transfer Assistance: Min guard for safety.  Multiple attempts and uses momentum for power up.     Exercise     Balance Static Standing Balance Static Standing - Balance Support: Bilateral upper extremity supported Static Standing - Level of Assistance: 5: Stand by assistance   End of Session OT - End of Session Equipment Utilized During Treatment:  (RW) Activity Tolerance: Patient tolerated treatment well Patient left: in  chair;with call bell/phone within reach Nurse Communication: Mobility status  GO   06/29/2012 Cipriano Mile OTR/L Pager [phone] Office [phone]   Cipriano Mile 06/29/2012, 4:27 PM

## 2012-06-29 NOTE — Progress Notes (Signed)
Clinical Child psychotherapist (CSW) provided pt with bed offers. Pt has chosen placement at [facility]. CSW contacted facility who will be able to provide placement at discharge. Pt very appreciative of CSW assistance. Pt has now transferred to 4700, hand off given to new CSW who will begin following and assist with dc when pt is medically ready. This CSW signing off.  Theresia Bough, MSW, Theresia Majors [phone]

## 2012-06-29 NOTE — Progress Notes (Signed)
Patient refused to have IV reinserted, MD notified and does not need Iv now.

## 2012-06-30 DIAGNOSIS — R5383 Other fatigue: Secondary | ICD-10-CM | POA: Diagnosis present

## 2012-06-30 DIAGNOSIS — G4730 Sleep apnea, unspecified: Secondary | ICD-10-CM | POA: Diagnosis present

## 2012-06-30 DIAGNOSIS — R0609 Other forms of dyspnea: Secondary | ICD-10-CM

## 2012-06-30 LAB — GLUCOSE, CAPILLARY: Glucose-Capillary: 168 mg/dL — ABNORMAL HIGH (ref 70–99)

## 2012-06-30 NOTE — Progress Notes (Addendum)
Interim Progress Note  Patient with 1st degree AV block noted on telemetry once at 8pm last night, with no notification to our team. No chest pain, dyspnea, or new discomfort at that time or since. Normal pulse. Pt's vital signs remained stable and can clinically follow if recurs or chest pain develops.  [name]  06/30/2012 2:27 PM

## 2012-06-30 NOTE — Discharge Summary (Signed)
I have reviewed this discharge summary and agree.    

## 2012-06-30 NOTE — Clinical Social Work Note (Signed)
CSW covering for unit CSW facilitating d/c today.  CSW notified Family Practice re: completion of d/c summary.  MD Benjamin Stain will re-examine the pt this morning and complete d/c summary.  CSW also requests MD to ensure FL2 on chart has been signed.  MD is agreeable.  CSW continuing to follow.  Vickii Penna, LCSWA [phone]  Clinical Social Work

## 2012-06-30 NOTE — Clinical Social Work Placement (Signed)
     Clinical Social Work Department CLINICAL SOCIAL WORK PLACEMENT NOTE 06/30/2012  Patient:  COMMODORE, BELLEW  Account Number:  [id number] Admit date:  06/27/2012  Clinical Social Worker:  Theresia Bough, Theresia Majors  Date/time:  06/28/2012 12:54 PM  Clinical Social Work is seeking post-discharge placement for this patient at the following level of care:   SKILLED NURSING   (*CSW will update this form in Epic as items are completed)   06/28/2012  Patient/family provided with [facility] Department of Clinical Social Works list of facilities offering this level of care within the geographic area requested by the patient (or if unable, by the patients family).  06/28/2012  Patient/family informed of their freedom to choose among providers that offer the needed level of care, that participate in Medicare, Medicaid or managed care program needed by the patient, have an available bed and are willing to accept the patient.  06/28/2012  Patient/family informed of [facility] ownership interest in [facility], as well as of the fact that they are under no obligation to receive care at this facility.  PASARR submitted to EDS on 06/28/2012 PASARR number received from EDS on 06/29/2012  FL2 transmitted to all facilities in geographic area requested by pt/family on  06/28/2012 FL2 transmitted to all facilities within larger geographic area on   Patient informed that his/her managed care company has contracts with or will negotiate with  certain facilities, including the following:     Patient/family informed of bed offers received:  06/29/2012 Patient chooses bed at [facility] Physician recommends and patient chooses bed at    Patient to be transferred to [facility] [place] on  06/30/2012 Patient to be transferred to facility by ambulance Sharin Mons)  The following physician request were entered in Epic:   Additional Comments: Pt requesting placement  at [facility] or [facility].  06/30/12  Ok for d/c today to SNF- Bariatric bed in place per SNF and they are ready for pateint.  Ok per patient and his son is also aware. Notified patient's nurse Pattricia Boss who will call report. No further CSW needs identified; CSW signing off.  Lorri Frederick. Lazariah Savard, LCSWA  [phone]

## 2012-06-30 NOTE — Progress Notes (Signed)
  Echocardiogram 2D Echocardiogram has been performed.  [name] 06/30/2012, 11:20 AM

## 2012-06-30 NOTE — Clinical Social Work Note (Addendum)
Covering CSW sent d/c summary to [facility], [place] for review.  CSW also called and confirmed today's admission with Traci at [place], [place].  Traci stated that SNF would be prepared to receive pt around 3pm.  They are awaiting the arrival of his bariatric bed.    [facility] SNF # for report: [phone]  Vickii Penna, LCSWA [phone]  Clinical Social Work

## 2012-06-30 NOTE — Progress Notes (Signed)
1530 report given to [facility], LPN from [facility]

## 2012-06-30 NOTE — Progress Notes (Signed)
1550 Transported to [employer] via [employer]

## 2012-07-26 ENCOUNTER — Ambulatory Visit (INDEPENDENT_AMBULATORY_CARE_PROVIDER_SITE_OTHER)
Admission: RE | Admit: 2012-07-26 | Discharge: 2012-07-26 | Disposition: A | Discharge status: Home / Self Care | Payer: Medicare Other | Source: Ambulatory Visit | Attending: Internal Medicine | Admitting: Internal Medicine

## 2012-07-26 ENCOUNTER — Encounter: Payer: Self-pay | Admitting: Internal Medicine

## 2012-07-26 ENCOUNTER — Ambulatory Visit (INDEPENDENT_AMBULATORY_CARE_PROVIDER_SITE_OTHER): Payer: Medicare Other | Admitting: Internal Medicine

## 2012-07-26 VITALS — BP 122/68 | HR 72 | Temp 98.4°F

## 2012-07-26 DIAGNOSIS — R05 Cough: Secondary | ICD-10-CM

## 2012-07-26 DIAGNOSIS — R0609 Other forms of dyspnea: Secondary | ICD-10-CM

## 2012-07-26 DIAGNOSIS — R0600 Dyspnea, unspecified: Secondary | ICD-10-CM

## 2012-07-26 DIAGNOSIS — R079 Chest pain, unspecified: Secondary | ICD-10-CM

## 2012-07-26 MED ORDER — CARVEDILOL 12.5 MG PO TABS: 12.5000 mg | ORAL_TABLET | Freq: Two times a day (BID) | ORAL | Status: DC

## 2012-07-26 NOTE — Assessment & Plan Note (Signed)
Chest pain is chronic, reproduced with exertion and assoc with sob but no pleuritic features in pt with known ihd.  No evidence to suggest pe but he is and will be at risk chronically.   For now rec increase coreg to 12.5 bid and f/u with cardiology w/in the next week

## 2012-07-26 NOTE — Assessment & Plan Note (Signed)
The most common causes of chronic cough in immunocompetent adults include the following: upper airway cough syndrome (UACS), previously referred to as postnasal drip syndrome (PNDS), which is caused by variety of rhinosinus conditions; (2) asthma; (3) GERD; (4) chronic bronchitis from cigarette smoking or other inhaled environmental irritants; (5) nonasthmatic eosinophilic bronchitis; and (6) bronchiectasis.   These conditions, singly or in combination, have accounted for up to 94% of the causes of chronic cough in prospective studies.   Other conditions have constituted no >6% of the causes in prospective studies These have included bronchogenic carcinoma, chronic interstitial pneumonia, sarcoidosis, left ventricular failure, ACEI-induced cough, and aspiration from a condition associated with pharyngeal dysfunction.    Chronic cough is often simultaneously caused by more than one condition. A single cause has been found from 38 to 82% of the time, multiple causes from 18 to 62%. Multiply caused cough has been the result of three diseases up to 42% of the time.       At this point most likely this is  Classic Upper airway cough syndrome, so named because it's frequently impossible to sort out how much is  CR/sinusitis with freq throat clearing (which can be related to primary GERD)   vs  causing  secondary (" extra esophageal")  GERD from wide swings in gastric pressure that occur with throat clearing, often  promoting self use of mint and menthol lozenges that reduce the lower esophageal sphincter tone and exacerbate the problem further in a cyclical fashion.   These are the same pts (now being labeled as having "irritable larynx syndrome" by some cough centers) who not infrequently have a history of having failed to tolerate ace inhibitors,  dry powder inhalers or biphosphonates or report having atypical reflux symptoms that don't respond to standard doses of PPI , and are easily confused as having  aecopd or asthma flares by even experienced allergists/ pulmonologists.   For now with nl cxr and most of his symptoms related to throat clearing will defer further w/u and rx

## 2012-07-26 NOTE — Patient Instructions (Addendum)
Discomfort anywhere in your chest that occurs only  with exertion assoc with difficulty breathing is coming from your heart until proven otherwise and you will need to return to Dr Kirke Corin.  In meantime increase coreg to 12.5 mg twice daily and follow up with Dr Kirke Corin / [facility] cardiology in [place] - to ER if symptoms don't go away at rest.   Please remember to go to the x-ray department downstairs for your tests - we will call you with the results when they are available.  Pulmonary follow up is as needed at this point

## 2012-07-26 NOTE — Progress Notes (Signed)
Subjective:     Patient ID: [name], male   DOB: 06/27/1945  MRN: [id number]  HPI  Date of Admission: 06/27/2012  Date of Discharge: 06/30/2012  PCP: Barbette Reichmann, MD  Consultants: Cardiology  Reason for Admission: Dyspnea, intermittent chest pain with elevated proBNP  Discharge Diagnosis  Primary  1. Obesity hypoventilation syndrome vs sleep apnea likely  Secondary  1. Fatigue  2. Viral illness  3. Recent STEMI with chest pain  4. HTN  5. DM  Hospital Course:  [name] is a 67 y.o. year old male with CAD s/p posterior MI 5/28, morbid obesity, DM, and CHF presenting with weakness, dyspnea and intermittent chest pain. Pro-BNP is elevated at 1900.  # Obesity hypoventilation syndrome vs sleep apnea, likely: With dyspnea and chest pain, there was concern on admission for CHF exacerbation, pneumonia, and cardiac cause, especially given recent admission 5/28 with inferior STEMI. CXR was poor quality but showed no infiltratte or frank pulmonary edema, and was stable on repeat. ProBNP was elevated to support CHF, but patient had minimal pitting LE edema. Pt admitted to stepdown for workup. Troponin continued trending downward and was negative x 2. With subjective fevers and pneumonia, pt had no documented fever and 2-view CXR was WNL. Pt received one dose of IV lasix which was not redosed. Diuresed about 2.6L this hospital course. Weight likely unreliable in obese male. Started cefipime and vancomycin which was discontinued on day 2. Further clarification revealed patient snores nightly and while hospitalized had O2 desaturations at night time requiring O2 by [place], when during the day he required no oxygen by discharge. Dyspnea was not characteristic for PE though risk factors of obesity and likely sedentary nature were present. Did not order d-dimer given recent cardiac cath, and held off on CTA chest unless pt desaturated with chest pain or SOB so did not order this. Was transferred out of  stepdown prior to discharge. Unable to perform sleep study in the hospital but strongly recommend that patient get split sleep study, for which he can follow-up with pulmonology to get kit and use this in SNF, and then bring it back to clinic for interpretation. He may greatly benefit from CPAP, though this was unable to be performed in hospital with no previous evaluation.  # Fatigue - Thought due to viral illness vs undiagnosed sleep apnea/obesity hypoventilation syndrome. Pt denied focal deficits, unintentional weight loss/night sweats, or thyroid symptoms, making neurologic process, malignancy, and thyroid dysfunction unlikely. TSH and CK were WNL. He reported snoring (see above) and had 1-2 episodes of oxygen desaturation while asleep at night, improved with O2 by [place]. Attempted to place CPAP but patient did not get. He will need a sleep study but cannot get as an inpatient. Recommend outpatient pulmonology visit for sleep study kit, which he can use in SNF and return to clinic for evaluation. Pt requested evaluation for SNF, and PT/OT consulted and PT recommended SNF placement for help with ADLs/IADLs.  # Viral illness - Pt arrived with neck / throat pain and chest congestion / mild cough that was greatly relieved by day 2 after receiving chloraseptic spray. CHF exacerbation and pneumonia less likely per above. He was afebrile this hospitalization. He had mild shortnes of breath night times with oxygen desaturations, which resolved with oxygen administration by [place] at 2L and did not require any support during daytime. This was thought to be due most likely to obesity hypoventilation / sleep apnea as yet undiagnosed, given lack of CP/SOB  by discharge and long h/o snoring per pt, along with morbidly obese habitus.  # Recent STEMI with chest pain - Chest pain seemed anginal on admission. Prior hospitalization, he had catheterization with successful aspiration thrombectomy and balloon angioplasty with EF normal  on cath. In the ED, patient had new O2 requirement and chest pain was partially relieved with SL nitroglycerin and GI cocktail. Continued effient and aspirin for ACS, along with metoprolol. Risk stratification labs were done prior month, but ordered TSH which was normal. Cardiology was consulted with no further recommendations. Through hospital course, pain became more when he turned in bed and then by day 3 was resolved.  # HTN - Stable. Continued home ARB and beta blocker with no issues.  # DM - A1c 8.0 on 06/24/12. On insulin NPH 50 units BID at home + metformin/glyburide. Continued NPH 25 units BID, moderate SSI, and held metformin/glyburide. Follow up at [facility] for med management.   07/26/2012 new pt ov/[facility]  Chief Complaint  Patient presents with  . HFU    Pt c/o still having rt side CP and prod cough with large amounts of yellow sputum x 1 month.   onset was over a month prior to OV  Was gen ant cp now just describing a Right upper chest  " "congestion"  better p coughing with min beige mucus production more day than night, more of a throat clearing.  His cp is not pleuritic at all but is intermittent and brought on by exertion and assoc with sob and has been present ever since admit, more localized than previously.  No obvious daytime variabilty or assoc  chest tightness, subjective wheeze overt sinus or hb symptoms. No unusual exp hx or h/o childhood pna/ asthma or knowledge of premature birth.   Sleeping ok without nocturnal  or early am exacerbation  of respiratory  c/o's or need for noct saba. Also denies any obvious fluctuation of symptoms with weather or environmental changes or other aggravating or alleviating factors except as outlined above  Current Medications, Allergies, Past Medical History, Past Surgical History, Family History, and Social History were reviewed in [employer] record.  ROS  The following are not active complaints unless bolded sore throat,  dysphagia, dental problems, itching, sneezing,  nasal congestion or excess/ purulent secretions, ear ache,   fever, chills, sweats, unintended wt loss, pleuritic or exertional cp, hemoptysis,  orthopnea pnd or leg swelling, presyncope, palpitations, heartburn, abdominal pain, anorexia, nausea, vomiting, diarrhea  or change in bowel or urinary habits, change in stools or urine, dysuria,hematuria,  rash, arthralgias, visual complaints, headache, numbness weakness or ataxia or problems with walking or coordination,  change in mood/affect or memory.       Review of Systems     Objective:   Physical Exam  Obese wm on stretcher nad ? Belle affect who  failed to answer a single question asked in a straightforward manner, tending to go off on tangents or answer questions with ambiguous medical terms or diagnoses and seemed perplexed   when asked the same question more than once for clarification.   HEENT: nl dentition, turbinates, and orophanx. Nl external ear canals without cough reflex   NECK :  without JVD/Nodes/TM/ nl carotid upstrokes bilaterally   LUNGS: no acc muscle use, clear to A and P bilaterally without cough on insp or exp maneuvers   CV:  RRR  no s3 or murmur or increase in P2, 1+   bilateral pitting  edema  ABD:  soft and nontender with nl excursion in the supine position. No bruits or organomegaly, bowel sounds nl  MS:  warm without deformities, calf tenderness, cyanosis or clubbing  SKIN: warm and dry without lesions    NEURO:   No focal  deficits    CXR  07/26/2012 :  Low lung volumes with no new focal or acute abnormality seen       Assessment:

## 2012-07-26 NOTE — Assessment & Plan Note (Signed)
Complicated by ? osa and now severely debilitated and relatively high risk for dvt/ pe.   Will need f/u as outpt once no longer in snf by one of our sleep docs

## 2012-08-03 ENCOUNTER — Encounter: Payer: Self-pay | Admitting: *Deleted

## 2012-08-03 NOTE — Progress Notes (Signed)
Quick Note:  Letter mailed to the pt ______ 

## 2012-08-15 ENCOUNTER — Encounter: Payer: Medicare Other | Admitting: Physician Assistant

## 2012-08-15 ENCOUNTER — Ambulatory Visit: Payer: Medicare Other | Admitting: Cardiovascular Disease

## 2012-08-22 IMAGING — CR DG CHEST 1V PORT
1 series · 1 of 1 positions shown · non-contrast
Comparison: 04/01/2010.

CLINICAL DATA: 65-year-old male myocardial infarction.  Cardiac
stent.

PORTABLE CHEST - 1 VIEW

[view not recorded]
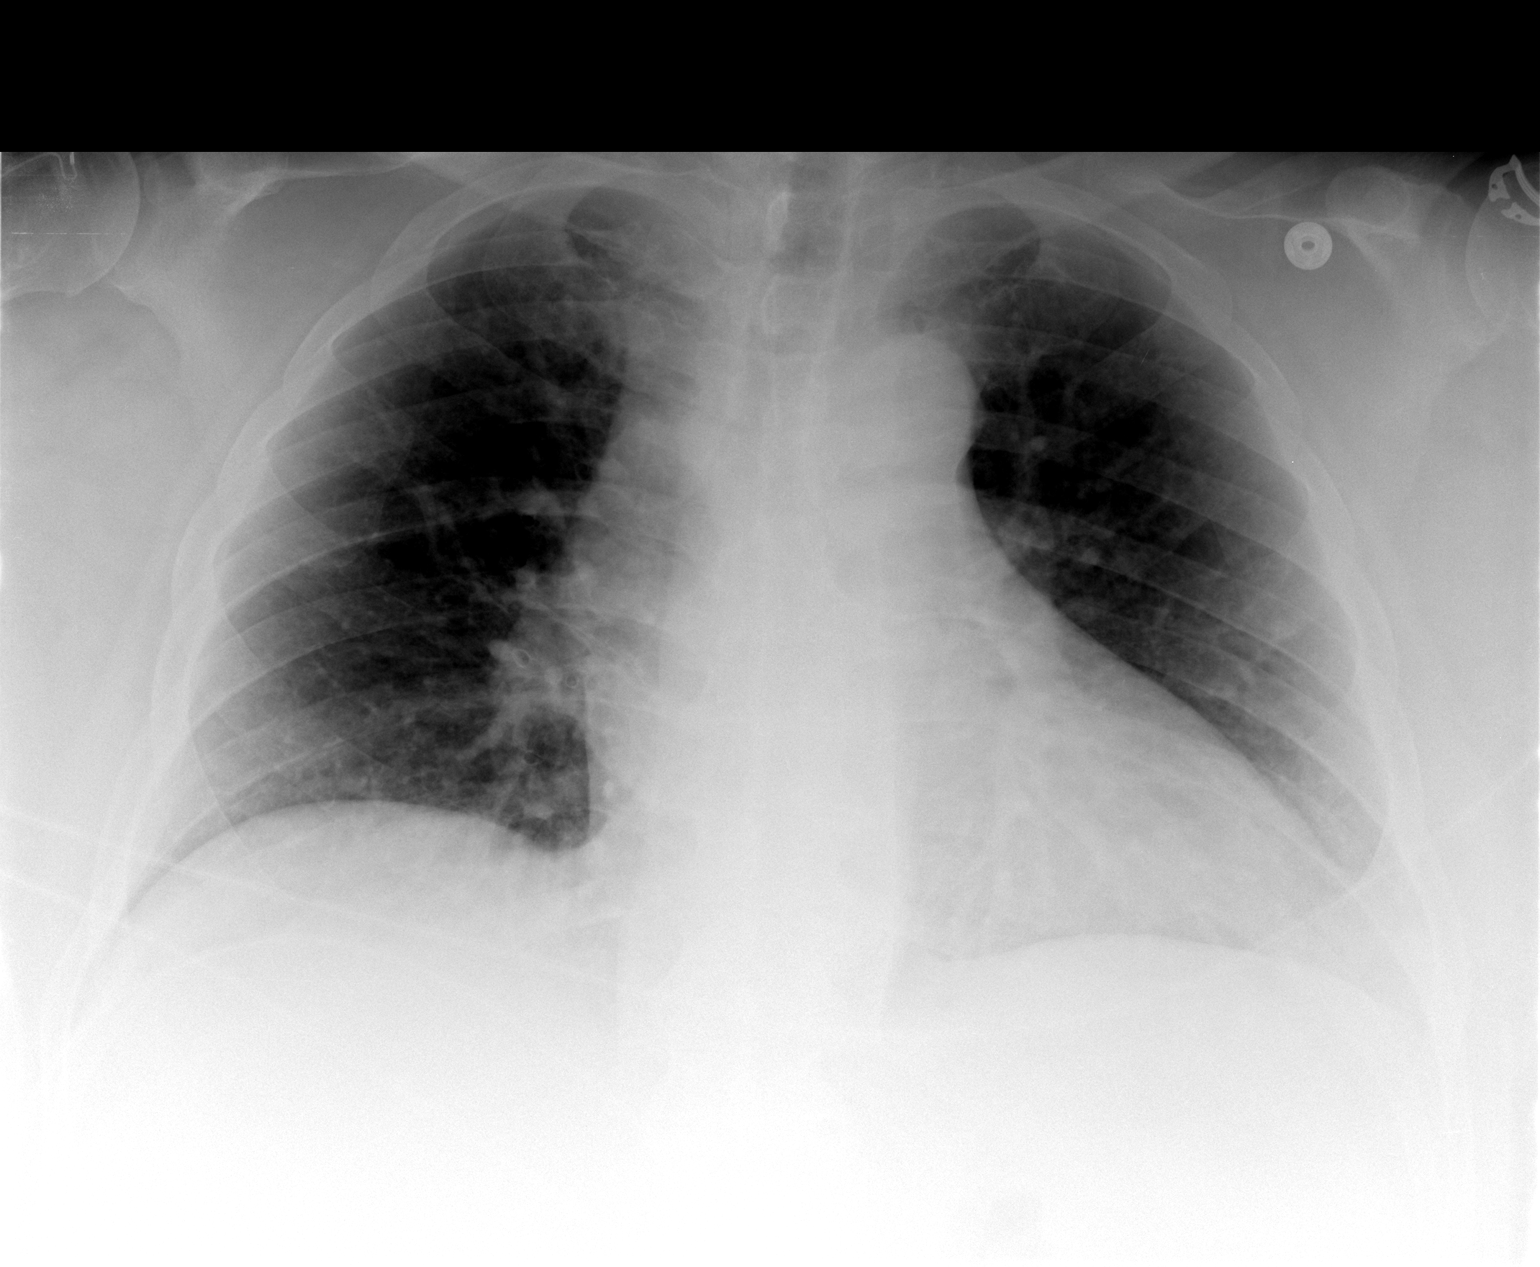

[1 of 1 positions shown; findings below may reference images not displayed]

FINDINGS: Portable semi upright AP view 9099 hours.  Stable low
lung volumes. Stable cardiomegaly and mediastinal contours.  No
pneumothorax, pulmonary edema, pleural effusion or acute pulmonary
opacity.
IMPRESSION: No acute cardiopulmonary abnormality.

## 2012-08-26 ENCOUNTER — Encounter: Payer: Self-pay | Admitting: Cardiovascular Disease

## 2012-08-26 ENCOUNTER — Ambulatory Visit (INDEPENDENT_AMBULATORY_CARE_PROVIDER_SITE_OTHER): Payer: Medicare Other | Admitting: Cardiovascular Disease

## 2012-08-26 VITALS — BP 110/78 | HR 81 | Ht 71.0 in | Wt 394.5 lb

## 2012-08-26 DIAGNOSIS — E785 Hyperlipidemia, unspecified: Secondary | ICD-10-CM

## 2012-08-26 DIAGNOSIS — I2510 Atherosclerotic heart disease of native coronary artery without angina pectoris: Secondary | ICD-10-CM

## 2012-08-26 DIAGNOSIS — I255 Ischemic cardiomyopathy: Secondary | ICD-10-CM

## 2012-08-26 DIAGNOSIS — I10 Essential (primary) hypertension: Secondary | ICD-10-CM

## 2012-08-26 DIAGNOSIS — I2589 Other forms of chronic ischemic heart disease: Secondary | ICD-10-CM

## 2012-08-26 DIAGNOSIS — R0789 Other chest pain: Secondary | ICD-10-CM

## 2012-08-26 NOTE — Assessment & Plan Note (Signed)
Lab Results  Component Value Date   CHOL 140 06/23/2012   HDL 32* 06/23/2012   LDLCALC 86 06/23/2012   TRIG 108 06/23/2012   CHOLHDL 4.4 06/23/2012   Continue treatment with high dose atorvastatin.

## 2012-08-26 NOTE — Patient Instructions (Addendum)
Continue same medications.  Follow up in 3 months.  

## 2012-08-26 NOTE — Progress Notes (Signed)
HPI  This is a 67 year old male who is here today for a followup visit regarding coronary artery disease.  In 2012, he had anterior elevation myocardial infarction and was noted to have severe left main coronary artery stenosis due to plaque rupture. He was not a candidate for CABG due to morbid obesity. He underwent angioplasty and drug-eluting stent placement without complications. He presented in April of 2014 with non-ST elevation myocardial infarction. He was transferred to [facility] where I performed cardiac catheterization via the right radial artery. This showed patent left main stent. He was found to have subtotal thrombotic occlusion of the right coronary artery with large thrombus. He underwent successful thrombectomy and 2 overlapped drug-eluting stent placements to the mid and proximal RCA.  He presented 6 weeks later with inferior ST elevation MI needed to late stent thrombosis after he stopped taking Effient. I treated him with thrombectomy and balloon angioplasty. Ejection fraction was normal. He was readmitted few weeks later with bronchitis and atypical chest pain. He was treated with antibiotics and discharged to a skilled nursing facility where he he stayed for a month. Unfortunately, he continues to gain weight. He had recurrent atypical chest pain after his myocardial infarction. He is supposed to be on Lasix 20 mg once daily but does not take it on a regular basis. He lives by himself with limited social support.   No Known Allergies   Current Outpatient Prescriptions on File Prior to Visit  Medication Sig Dispense Refill  . acetaminophen (TYLENOL) 500 MG tablet Take 2 tablets (1,000 mg total) by mouth every 6 (six) hours as needed for pain (knee and chest pain).  30 tablet    . aspirin 81 MG chewable tablet Chew 4 tablets (324 mg total) by mouth once.      Marland Kitchen atorvastatin (LIPITOR) 80 MG tablet Take 1 tablet (80 mg total) by mouth daily.  30 tablet  3  . betamethasone valerate  ointment (VALISONE) 0.1 % Apply 1 application topically 2 (two) times daily as needed (for excema).      . insulin aspart (NOVOLOG) 100 UNIT/ML injection Inject 0-15 Units into the skin 3 (three) times daily with meals.  1 vial  12  . losartan (COZAAR) 25 MG tablet Take 1 tablet (25 mg total) by mouth daily.  30 tablet  3  . nitroGLYCERIN (NITROSTAT) 0.4 MG SL tablet Place 1 tablet (0.4 mg total) under the tongue every 5 (five) minutes as needed for chest pain (up to 3 doses).  25 tablet  3  . phenol (CHLORASEPTIC) 1.4 % LIQD Use as directed 1 spray in the mouth or throat as needed.    0  . prasugrel (EFFIENT) 10 MG TABS Take 1 tablet (10 mg total) by mouth daily.  30 tablet  6   No current facility-administered medications on file prior to visit.     Past Medical History  Diagnosis Date  . Hypertension   . Hyperlipidemia   . Morbid obesity   . NSVT (nonsustained ventricular tachycardia)     a. 04/2012 post MI; normal LVF   . Physical deconditioning     requiring outpatient PT  . Osteoarthritis   . Lower extremity cellulitis   . Peripheral edema   . Diabetes mellitus      Insulin-dependent  . Chronic knee pain   . Anemia, unspecified   . Ischemic cardiomyopathy     a. 4.2014 EF 40% by LV gram.  . Coronary artery disease  a. s/p anterior STEMI 07/13/10: tx with Promus DES to distal LM; s/p POBA to distal LAD;  b. 04/2012 NSTEMI/Cath/PCI: LM 20%, LAD 20% p, 41m, 100d, D1 60-70ost, D2 nl, D3 nl, LCX mid dzs, OM1/2/3 min irregs, RCA dom 60ost, 90p, 63m (3.5x38 & 4.0x24 Promus DES'), EF 40%, inf HK. c. 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  Marland Kitchen Anxiety   . Depression   . Sleep apnea   . CHF (congestive heart failure)      Past Surgical History  Procedure Laterality Date  . Coronary stent placement      drug-eluting to the distal left main and PTCA of the distal left anterior descending.  . Skin graft  Right     history  . Knee surgery Left   . Cataract surgery    . Cardiac catheterization    . Cardiac catheterization      x2 stents;[facility]     Family History  Problem Relation Age of Onset  . Heart attack Brother 60    died  . Coronary artery disease      strongly positive family hx of  . Heart attack Father      History   Social History  . Marital Status: Married    Spouse Name: [name]    Number of Children: 2  . Years of Education: [name]   Occupational History  . Not on file.   Social History Main Topics  . Smoking status: Never Smoker   . Smokeless tobacco: Never Used  . Alcohol Use: No  . Drug Use: No  . Sexually Active: Not Currently   Other Topics Concern  . Not on file   Social History Narrative  . No narrative on file     PHYSICAL EXAM   BP 110/78  Pulse 81  Ht 5\' 11"  (1.803 m)  Wt 394 lb 8 oz (178.944 kg)  BMI 55.05 kg/m2 Constitutional: He is oriented to person, place, and time. He appears well-developed and well-nourished. No distress.  HENT: No nasal discharge.  Head: Normocephalic and atraumatic.  Eyes: Pupils are equal and round. Right eye exhibits no discharge. Left eye exhibits no discharge.  Neck: Normal range of motion. Neck supple. No JVD present. No thyromegaly present.  Cardiovascular: Normal rate, regular rhythm, normal heart sounds and. Exam reveals no gallop and no friction rub. No murmur heard.  Pulmonary/Chest: Effort normal and breath sounds normal. No stridor. No respiratory distress. He has no wheezes. He has no rales. He exhibits no tenderness.  Abdominal: Soft. Bowel sounds are normal. He exhibits no distension. There is no tenderness. There is no rebound and no guarding.  Musculoskeletal: Normal range of motion. He exhibits +1 edema and no tenderness.  Neurological: He is alert and oriented to person, place, and time. Coordination normal.  Skin: Skin is warm and dry. Severe stasis dermatitis in the lower extremities. He is not  diaphoretic. No erythema. No pallor.  Psychiatric: He has a normal mood and affect. His behavior is normal. Judgment and thought content normal.       UEA:VWUJW  Rhythm  -First degree A-V block  PRi = 220 Low voltage in precordial leads.   -Old inferior infarct  -Old anterior infarct.   ABNORMAL    ASSESSMENT AND PLAN

## 2012-08-26 NOTE — Assessment & Plan Note (Addendum)
Blood pressure is well controlled on current medications. 

## 2012-08-26 NOTE — Assessment & Plan Note (Signed)
His ejection fraction was mildly reduced during the initial cardiac catheterization which was normal subsequently. He does seem to be mildly fluid overloaded and has not been taking Lasix on a regular basis. I advised him to take this once a day.

## 2012-08-26 NOTE — Assessment & Plan Note (Signed)
The patient has begun atypical chest pain. ECG did not show any new ischemic changes. Continue medical therapy with lifelong dual antiplatelet therapy. Unfortunately, given his morbid obesity, there is limited utility for stress testing. I recommend reserving cardiac catheterization for unstable symptoms. I discussed with him the importance of weight loss. He also does not have any significant social support at home. This is going to be a significant issue going down the road.

## 2012-11-29 ENCOUNTER — Ambulatory Visit: Payer: Medicare Other | Admitting: Cardiovascular Disease

## 2012-12-01 ENCOUNTER — Ambulatory Visit: Payer: Medicare Other | Admitting: Cardiovascular Disease

## 2013-01-30 ENCOUNTER — Inpatient Hospital Stay (HOSPITAL_COMMUNITY)
Admission: EM | Admit: 2013-01-30 | Discharge: 2013-02-02 | DRG: 872 | Disposition: A | Discharge status: Skilled Nursing Facility | Payer: Medicare Other | Attending: Internal Medicine | Admitting: Internal Medicine

## 2013-01-30 ENCOUNTER — Emergency Department (HOSPITAL_COMMUNITY): Payer: Medicare Other

## 2013-01-30 ENCOUNTER — Encounter (HOSPITAL_COMMUNITY): Payer: Self-pay | Admitting: Emergency Medicine

## 2013-01-30 DIAGNOSIS — A419 Sepsis, unspecified organism: Principal | ICD-10-CM | POA: Diagnosis present

## 2013-01-30 DIAGNOSIS — R5381 Other malaise: Secondary | ICD-10-CM | POA: Diagnosis present

## 2013-01-30 DIAGNOSIS — M25569 Pain in unspecified knee: Secondary | ICD-10-CM | POA: Diagnosis present

## 2013-01-30 DIAGNOSIS — I255 Ischemic cardiomyopathy: Secondary | ICD-10-CM

## 2013-01-30 DIAGNOSIS — I252 Old myocardial infarction: Secondary | ICD-10-CM

## 2013-01-30 DIAGNOSIS — E119 Type 2 diabetes mellitus without complications: Secondary | ICD-10-CM | POA: Diagnosis present

## 2013-01-30 DIAGNOSIS — G8929 Other chronic pain: Secondary | ICD-10-CM | POA: Diagnosis present

## 2013-01-30 DIAGNOSIS — I509 Heart failure, unspecified: Secondary | ICD-10-CM | POA: Diagnosis present

## 2013-01-30 DIAGNOSIS — W010XXA Fall on same level from slipping, tripping and stumbling without subsequent striking against object, initial encounter: Secondary | ICD-10-CM | POA: Diagnosis present

## 2013-01-30 DIAGNOSIS — Z7982 Long term (current) use of aspirin: Secondary | ICD-10-CM

## 2013-01-30 DIAGNOSIS — F3289 Other specified depressive episodes: Secondary | ICD-10-CM | POA: Diagnosis present

## 2013-01-30 DIAGNOSIS — IMO0001 Reserved for inherently not codable concepts without codable children: Secondary | ICD-10-CM | POA: Diagnosis present

## 2013-01-30 DIAGNOSIS — L0390 Cellulitis, unspecified: Secondary | ICD-10-CM

## 2013-01-30 DIAGNOSIS — Z8249 Family history of ischemic heart disease and other diseases of the circulatory system: Secondary | ICD-10-CM

## 2013-01-30 DIAGNOSIS — F329 Major depressive disorder, single episode, unspecified: Secondary | ICD-10-CM | POA: Diagnosis present

## 2013-01-30 DIAGNOSIS — I2589 Other forms of chronic ischemic heart disease: Secondary | ICD-10-CM | POA: Diagnosis present

## 2013-01-30 DIAGNOSIS — L538 Other specified erythematous conditions: Secondary | ICD-10-CM | POA: Diagnosis present

## 2013-01-30 DIAGNOSIS — E1165 Type 2 diabetes mellitus with hyperglycemia: Secondary | ICD-10-CM

## 2013-01-30 DIAGNOSIS — E785 Hyperlipidemia, unspecified: Secondary | ICD-10-CM | POA: Diagnosis present

## 2013-01-30 DIAGNOSIS — K219 Gastro-esophageal reflux disease without esophagitis: Secondary | ICD-10-CM | POA: Diagnosis present

## 2013-01-30 DIAGNOSIS — L02219 Cutaneous abscess of trunk, unspecified: Secondary | ICD-10-CM | POA: Diagnosis present

## 2013-01-30 DIAGNOSIS — Z9114 Patient's other noncompliance with medication regimen: Secondary | ICD-10-CM

## 2013-01-30 DIAGNOSIS — L03311 Cellulitis of abdominal wall: Secondary | ICD-10-CM

## 2013-01-30 DIAGNOSIS — Z91199 Patient's noncompliance with other medical treatment and regimen due to unspecified reason: Secondary | ICD-10-CM

## 2013-01-30 DIAGNOSIS — G4733 Obstructive sleep apnea (adult) (pediatric): Secondary | ICD-10-CM | POA: Diagnosis present

## 2013-01-30 DIAGNOSIS — I10 Essential (primary) hypertension: Secondary | ICD-10-CM | POA: Diagnosis present

## 2013-01-30 DIAGNOSIS — G4730 Sleep apnea, unspecified: Secondary | ICD-10-CM | POA: Diagnosis present

## 2013-01-30 DIAGNOSIS — I2510 Atherosclerotic heart disease of native coronary artery without angina pectoris: Secondary | ICD-10-CM | POA: Diagnosis present

## 2013-01-30 DIAGNOSIS — M1990 Unspecified osteoarthritis, unspecified site: Secondary | ICD-10-CM | POA: Diagnosis present

## 2013-01-30 DIAGNOSIS — Z9861 Coronary angioplasty status: Secondary | ICD-10-CM

## 2013-01-30 DIAGNOSIS — R079 Chest pain, unspecified: Secondary | ICD-10-CM | POA: Diagnosis present

## 2013-01-30 DIAGNOSIS — Z6841 Body Mass Index (BMI) 40.0 and over, adult: Secondary | ICD-10-CM

## 2013-01-30 DIAGNOSIS — Z9119 Patient's noncompliance with other medical treatment and regimen: Secondary | ICD-10-CM

## 2013-01-30 DIAGNOSIS — L03319 Cellulitis of trunk, unspecified: Secondary | ICD-10-CM

## 2013-01-30 DIAGNOSIS — Z794 Long term (current) use of insulin: Secondary | ICD-10-CM

## 2013-01-30 DIAGNOSIS — F411 Generalized anxiety disorder: Secondary | ICD-10-CM | POA: Diagnosis present

## 2013-01-30 LAB — RAPID URINE DRUG SCREEN, HOSP PERFORMED
Amphetamines: NOT DETECTED
Barbiturates: NOT DETECTED
Benzodiazepines: NOT DETECTED
Cocaine: NOT DETECTED
OPIATES: NOT DETECTED
Tetrahydrocannabinol: NOT DETECTED

## 2013-01-30 LAB — CG4 I-STAT (LACTIC ACID): Lactic Acid, Venous: 1.23 mmol/L (ref 0.5–2.2)

## 2013-01-30 LAB — INFLUENZA PANEL BY PCR (TYPE A & B)
H1N1 flu by pcr: NOT DETECTED
Influenza A By PCR: NEGATIVE
Influenza B By PCR: NEGATIVE

## 2013-01-30 LAB — BASIC METABOLIC PANEL
BUN: 15 mg/dL (ref 6–23)
CALCIUM: 8.9 mg/dL (ref 8.4–10.5)
CO2: 28 mEq/L (ref 19–32)
Chloride: 94 mEq/L — ABNORMAL LOW (ref 96–112)
Creatinine, Ser: 0.83 mg/dL (ref 0.50–1.35)
GFR calc Af Amer: >90 mL/min (ref >90)
GFR, EST NON AFRICAN AMERICAN: 90 mL/min — AB (ref >90)
Glucose, Bld: 364 mg/dL — ABNORMAL HIGH (ref 70–99)
Potassium: 4.7 mEq/L (ref 3.7–5.3)
SODIUM: 133 meq/L — AB (ref 137–147)

## 2013-01-30 LAB — CBC
HCT: 44.5 % (ref 39.0–52.0)
Hemoglobin: 14.8 g/dL (ref 13.0–17.0)
MCH: 26.3 pg (ref 26.0–34.0)
MCHC: 33.3 g/dL (ref 30.0–36.0)
MCV: 79.2 fL (ref 78.0–100.0)
PLATELETS: 187 10*3/uL (ref 150–400)
RBC: 5.62 MIL/uL (ref 4.22–5.81)
RDW: 14.8 % (ref 11.5–15.5)
WBC: 15.7 10*3/uL — ABNORMAL HIGH (ref 4.0–10.5)

## 2013-01-30 LAB — HEMOGLOBIN A1C
Hgb A1c MFr Bld: 11 % — ABNORMAL HIGH (ref <5.7)
Mean Plasma Glucose: 269 mg/dL — ABNORMAL HIGH (ref <117)

## 2013-01-30 LAB — SALICYLATE LEVEL

## 2013-01-30 LAB — POCT I-STAT TROPONIN I
TROPONIN I, POC: 0.02 ng/mL (ref 0.00–0.08)
Troponin i, poc: 0.02 ng/mL (ref 0.00–0.08)

## 2013-01-30 LAB — PROTIME-INR
INR: 1.01 (ref 0.00–1.49)
PROTHROMBIN TIME: 13.1 s (ref 11.6–15.2)

## 2013-01-30 LAB — URINALYSIS, ROUTINE W REFLEX MICROSCOPIC
BILIRUBIN URINE: NEGATIVE
Glucose, UA: >1000 mg/dL — AB
Hgb urine dipstick: NEGATIVE
Ketones, ur: 40 mg/dL — AB
LEUKOCYTES UA: NEGATIVE
Nitrite: NEGATIVE
Protein, ur: NEGATIVE mg/dL
SPECIFIC GRAVITY, URINE: 1.038 — AB (ref 1.005–1.030)
Urobilinogen, UA: 0.2 mg/dL (ref 0.0–1.0)
pH: 5 (ref 5.0–8.0)

## 2013-01-30 LAB — GLUCOSE, CAPILLARY: GLUCOSE-CAPILLARY: 348 mg/dL — AB (ref 70–99)

## 2013-01-30 LAB — TROPONIN I: Troponin I: <0.3 ng/mL (ref <0.30)

## 2013-01-30 LAB — URINE MICROSCOPIC-ADD ON

## 2013-01-30 LAB — ACETAMINOPHEN LEVEL

## 2013-01-30 LAB — PRO B NATRIURETIC PEPTIDE: PRO B NATRI PEPTIDE: 3113 pg/mL — AB (ref 0–125)

## 2013-01-30 LAB — ETHANOL: Alcohol, Ethyl (B): <11 mg/dL (ref 0–11)

## 2013-01-30 MED ORDER — ONDANSETRON HCL 4 MG/2ML IJ SOLN: 4.0000 mg | Freq: Four times a day (QID) | INTRAMUSCULAR | Status: DC | PRN

## 2013-01-30 MED ORDER — INSULIN ASPART 100 UNIT/ML ~~LOC~~ SOLN
0.0000 [IU] | SUBCUTANEOUS | Status: DC
  Administered 2013-01-30: 17:00:00 11 [IU] via SUBCUTANEOUS
  Administered 2013-01-31: 12:00:00 8 [IU] via SUBCUTANEOUS
  Administered 2013-01-31 (×2): 5 [IU] via SUBCUTANEOUS
  Administered 2013-01-31: 8 [IU] via SUBCUTANEOUS
  Administered 2013-01-31: 02:00:00 5 [IU] via SUBCUTANEOUS

## 2013-01-30 MED ORDER — SODIUM CHLORIDE 0.9 % IV SOLN: 250.0000 mL | INTRAVENOUS | Status: DC | PRN

## 2013-01-30 MED ORDER — VANCOMYCIN HCL 10 G IV SOLR
1500.0000 mg | Freq: Two times a day (BID) | INTRAVENOUS | Status: DC
  Administered 2013-01-31 – 2013-02-01 (×3): 1500 mg via INTRAVENOUS
  Filled 2013-01-30 (×4): qty 1500

## 2013-01-30 MED ORDER — SODIUM CHLORIDE 0.9 % IJ SOLN: 3.0000 mL | INTRAMUSCULAR | Status: DC | PRN

## 2013-01-30 MED ORDER — HEPARIN SODIUM (PORCINE) 5000 UNIT/ML IJ SOLN
5000.0000 [IU] | Freq: Three times a day (TID) | INTRAMUSCULAR | Status: DC
  Administered 2013-01-30 – 2013-02-02 (×9): 5000 [IU] via SUBCUTANEOUS
  Filled 2013-01-30 (×12): qty 1

## 2013-01-30 MED ORDER — SODIUM CHLORIDE 0.9 % IJ SOLN
3.0000 mL | Freq: Two times a day (BID) | INTRAMUSCULAR | Status: DC
  Administered 2013-02-01 – 2013-02-02 (×2): 3 mL via INTRAVENOUS

## 2013-01-30 MED ORDER — CARVEDILOL 3.125 MG PO TABS
3.1250 mg | ORAL_TABLET | Freq: Two times a day (BID) | ORAL | Status: DC
  Administered 2013-01-30 – 2013-02-02 (×6): 3.125 mg via ORAL
  Filled 2013-01-30 (×8): qty 1

## 2013-01-30 MED ORDER — ASPIRIN 81 MG PO CHEW
324.0000 mg | CHEWABLE_TABLET | Freq: Every day | ORAL | Status: DC
  Administered 2013-01-30 – 2013-02-02 (×4): 324 mg via ORAL
  Filled 2013-01-30 (×4): qty 4

## 2013-01-30 MED ORDER — ONDANSETRON HCL 4 MG/2ML IJ SOLN
4.0000 mg | Freq: Once | INTRAMUSCULAR | Status: AC
  Administered 2013-01-30: 4 mg via INTRAVENOUS

## 2013-01-30 MED ORDER — PRASUGREL HCL 10 MG PO TABS
10.0000 mg | ORAL_TABLET | Freq: Every day | ORAL | Status: DC
  Administered 2013-01-30 – 2013-02-02 (×4): 10 mg via ORAL
  Filled 2013-01-30 (×4): qty 1

## 2013-01-30 MED ORDER — PIPERACILLIN-TAZOBACTAM 3.375 G IVPB 30 MIN
3.3750 g | Freq: Once | INTRAVENOUS | Status: AC
  Administered 2013-01-30: 3.375 g via INTRAVENOUS
  Filled 2013-01-30: qty 50

## 2013-01-30 MED ORDER — SODIUM CHLORIDE 0.9 % IV BOLUS (SEPSIS)
1000.0000 mL | Freq: Once | INTRAVENOUS | Status: AC
  Administered 2013-01-30: 1000 mL via INTRAVENOUS

## 2013-01-30 MED ORDER — ONDANSETRON HCL 4 MG/2ML IJ SOLN
INTRAMUSCULAR | Status: AC
  Filled 2013-01-30: qty 2

## 2013-01-30 MED ORDER — PIPERACILLIN-TAZOBACTAM 3.375 G IVPB
3.3750 g | Freq: Three times a day (TID) | INTRAVENOUS | Status: DC
  Administered 2013-01-30 – 2013-02-01 (×5): 3.375 g via INTRAVENOUS
  Filled 2013-01-30 (×8): qty 50

## 2013-01-30 MED ORDER — VANCOMYCIN HCL IN DEXTROSE 1-5 GM/200ML-% IV SOLN
1000.0000 mg | Freq: Once | INTRAVENOUS | Status: AC
  Administered 2013-01-30: 18:00:00 1000 mg via INTRAVENOUS
  Filled 2013-01-30: qty 200

## 2013-01-30 MED ORDER — ACETAMINOPHEN 325 MG PO TABS
650.0000 mg | ORAL_TABLET | Freq: Once | ORAL | Status: AC
  Administered 2013-01-30: 650 mg via ORAL
  Filled 2013-01-30: qty 2

## 2013-01-30 MED ORDER — SODIUM CHLORIDE 0.9 % IJ SOLN
3.0000 mL | Freq: Two times a day (BID) | INTRAMUSCULAR | Status: DC
  Administered 2013-01-30 – 2013-02-02 (×6): 3 mL via INTRAVENOUS

## 2013-01-30 MED ORDER — INSULIN GLARGINE 100 UNIT/ML ~~LOC~~ SOLN
10.0000 [IU] | Freq: Every day | SUBCUTANEOUS | Status: DC
Start: 2013-01-30 — End: 2013-01-31
  Administered 2013-01-30 – 2013-01-31 (×2): 10 [IU] via SUBCUTANEOUS
  Filled 2013-01-30 (×2): qty 0.1

## 2013-01-30 MED ORDER — ACETAMINOPHEN 325 MG PO TABS
650.0000 mg | ORAL_TABLET | ORAL | Status: DC | PRN
  Administered 2013-01-30 – 2013-02-01 (×4): 650 mg via ORAL
  Filled 2013-01-30 (×4): qty 2

## 2013-01-30 MED ORDER — VANCOMYCIN HCL IN DEXTROSE 1-5 GM/200ML-% IV SOLN
1000.0000 mg | Freq: Once | INTRAVENOUS | Status: AC
  Administered 2013-01-30: 1000 mg via INTRAVENOUS
  Filled 2013-01-30: qty 200

## 2013-01-30 MED ORDER — NITROGLYCERIN 0.4 MG SL SUBL
0.4000 mg | SUBLINGUAL_TABLET | SUBLINGUAL | Status: DC | PRN
  Administered 2013-01-31: 06:00:00 0.4 mg via SUBLINGUAL
  Filled 2013-01-30: qty 25

## 2013-01-30 NOTE — ED Provider Notes (Signed)
CSN: [id number]     Arrival date & time 01/30/13  0631 History   First MD Initiated Contact with Patient 01/30/13 [phone]     Chief Complaint  Patient presents with  . Chest Pain   (Consider location/radiation/quality/duration/timing/severity/associated sxs/prior Treatment) HPI Comments: Patient is a 68 year old male with an extensive past medical history including CAD, DM, CHF and depression who presents to the emergency department via EMS complaining of chest pain x1 month. Patient initially told EMS that his pain started 40 minutes ago, however changed his story and said this has been going on for a month. EMS gave him 2 sublingual nitroglycerin with complete relief of his chest pain. Patient was on the floor by EMS with multiple bottles of urine and while the trash and feces around him. He lives alone at home. Patient states last night he got up out of bed to get a glass of water, slipped and fell and hit his head. Unknown loss of consciousness. Patient states to both EMS and myself that "he is sick of this shit and wants it all to end", however denies suicidal or homicidal ideation. He does not want to return to his home because he states he cannot care for himself or live in those living conditions. Currently patient is complaining of nausea, shortness of breath, left hip pain and body aches. He vomited before EMS arrived. CBS by EMS 450, he has not been taking his insulin because he cannot afford it. Pt is a poor historian.  Patient is a 68 y.o. male presenting with chest pain. The history is provided by the patient and the EMS personnel.  Chest Pain Associated symptoms: nausea, shortness of breath and vomiting     Past Medical History  Diagnosis Date  . Hypertension   . Hyperlipidemia   . Morbid obesity   . NSVT (nonsustained ventricular tachycardia)     a. 04/2012 post MI; normal LVF   . Physical deconditioning     requiring outpatient PT  . Osteoarthritis   . Lower extremity cellulitis    . Peripheral edema   . Diabetes mellitus      Insulin-dependent  . Chronic knee pain   . Anemia, unspecified   . Ischemic cardiomyopathy     a. 4.2014 EF 40% by LV gram.  . Coronary artery disease     a. s/p anterior STEMI 07/13/10: tx with Promus DES to distal LM; s/p POBA to distal LAD;  b. 04/2012 NSTEMI/Cath/PCI: LM 20%, LAD 20% p, 43m, 100d, D1 60-70ost, D2 nl, D3 nl, LCX mid dzs, OM1/2/3 min irregs, RCA dom 60ost, 90p, 88m (3.5x38 & 4.0x24 Promus DES'), EF 40%, inf HK. c. 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  Marland Kitchen Anxiety   . Depression   . Sleep apnea   . CHF (congestive heart failure)    Past Surgical History  Procedure Laterality Date  . Coronary stent placement      drug-eluting to the distal left main and PTCA of the distal left anterior descending.  . Skin graft Right     history  . Knee surgery Left   . Cataract surgery    . Cardiac catheterization    . Cardiac catheterization      x2 stents;[facility]   Family History  Problem Relation Age of Onset  . Heart attack Brother 60    died  . Coronary artery disease      strongly positive family  hx of  . Heart attack Father    History  Substance Use Topics  . Smoking status: Never Smoker   . Smokeless tobacco: Never Used  . Alcohol Use: No    Review of Systems  Respiratory: Positive for shortness of breath.   Cardiovascular: Positive for chest pain.  Gastrointestinal: Positive for nausea and vomiting.  Musculoskeletal: Positive for arthralgias.       Positive for left hip pain.  Psychiatric/Behavioral: Positive for dysphoric mood.  All other systems reviewed and are negative.    Allergies  Review of patient's allergies indicates no known allergies.  Home Medications   Current Outpatient Rx  Name  Route  Sig  Dispense  Refill  . aspirin 81 MG chewable tablet   Oral   Chew 324 mg by mouth daily.         . prasugrel (EFFIENT) 10  MG TABS   Oral   Take 1 tablet (10 mg total) by mouth daily.   30 tablet   6   . nitroGLYCERIN (NITROSTAT) 0.4 MG SL tablet   Sublingual   Place 1 tablet (0.4 mg total) under the tongue every 5 (five) minutes as needed for chest pain (up to 3 doses).   25 tablet   3    BP 101/52  Pulse 95  Temp(Src) 99.8 F (37.7 C) (Oral)  Resp 23  SpO2 97% Physical Exam  Nursing note and vitals reviewed. Constitutional: He is oriented to person, place, and time. He appears well-developed and well-nourished. No distress.  Morbidly obese.  HENT:  Head: Normocephalic and atraumatic.  Mouth/Throat: Uvula is midline. Mucous membranes are dry.  Eyes: Conjunctivae and EOM are normal. Pupils are equal, round, and reactive to light.  Neck: Normal range of motion. Neck supple.  Cardiovascular: Normal rate, regular rhythm and normal heart sounds.   Non-pitting extremity edema.  Pulmonary/Chest: Effort normal and breath sounds normal. No respiratory distress.  Abdominal: Soft. Normal appearance and bowel sounds are normal. There is generalized tenderness (mild). There is no rigidity, no rebound and no guarding.  Musculoskeletal:  Exam limited by pt's body habitus.  Neurological: He is alert and oriented to person, place, and time.  Skin: Skin is warm. He is not diaphoretic.  Erythematous moist rash under skin folds of abdomen. Draining purulent fluid, malodorous, surrounding cellulitis.  Psychiatric: His behavior is normal. His mood appears anxious. He exhibits a depressed mood. He expresses no homicidal and no suicidal ideation.    ED Course  Procedures (including critical care time) Labs Review Labs Reviewed  CBC - Abnormal; Notable for the following:    WBC 15.7 (*)    All other components within normal limits  BASIC METABOLIC PANEL - Abnormal; Notable for the following:    Sodium 133 (*)    Chloride 94 (*)    Glucose, Bld 364 (*)    GFR calc non Af Amer 90 (*)    All other components  within normal limits  PRO B NATRIURETIC PEPTIDE - Abnormal; Notable for the following:    Pro B Natriuretic peptide (BNP) 3113.0 (*)    All other components within normal limits  SALICYLATE LEVEL - Abnormal; Notable for the following:    Salicylate Lvl <2.0 (*)    All other components within normal limits  URINALYSIS, ROUTINE W REFLEX MICROSCOPIC - Abnormal; Notable for the following:    Specific Gravity, Urine 1.038 (*)    Glucose, UA >1000 (*)    Ketones, ur 40 (*)  All other components within normal limits  CULTURE, BLOOD (ROUTINE X 2)  CULTURE, BLOOD (ROUTINE X 2)  URINE RAPID DRUG SCREEN (HOSP PERFORMED)  ACETAMINOPHEN LEVEL  URINE MICROSCOPIC-ADD ON  INFLUENZA PANEL BY PCR  CG4 I-STAT (LACTIC ACID)  POCT I-STAT TROPONIN I  POCT I-STAT TROPONIN I   Imaging Review Dg Chest 1 View  01/30/2013   CLINICAL DATA:  History of fall.  Injury.  Shortness of breath.  EXAM: CHEST - 1 VIEW  COMPARISON:  Chest x-ray 07/26/2012.  FINDINGS: Lung volumes are low. No consolidative airspace disease. No pleural effusions. No pneumothorax. Mild crowding of the pulmonary vasculature, accentuated by low lung volumes, without frank pulmonary edema. Heart size is normal. Mediastinal contours are unremarkable. Bony thorax appears grossly intact.  IMPRESSION: 1. Low lung volumes without radiographic evidence of acute cardiopulmonary disease.   Electronically Signed   By: Trudie Reed M.D.   On: 01/30/2013 07:51   Dg Hip Complete Left  01/30/2013   CLINICAL DATA:  History of fall complaining of pain in the left hip.  EXAM: LEFT HIP - COMPLETE 2+ VIEW  COMPARISON:  No priors.  FINDINGS: AP view of the pelvis and AP and lateral views of the left hip demonstrate no definite acute displaced fractures of the bony pelvic ring. Bilateral proximal femurs as visualized appear intact, and the left femoral head is located. Mild degenerative changes of osteoarthritis are noted in the hip joints bilaterally.  IMPRESSION:  1. No acute radiographic abnormality of the bony pelvis or the left hip.   Electronically Signed   By: Trudie Reed M.D.   On: 01/30/2013 07:49   Ct Head Wo Contrast  01/30/2013   CLINICAL DATA:  History of fall with injury to the left side of the head. Head pain.  EXAM: CT HEAD WITHOUT CONTRAST  TECHNIQUE: Contiguous axial images were obtained from the base of the skull through the vertex without intravenous contrast.  COMPARISON:  No priors.  FINDINGS: Mild patchy and confluent areas of decreased attenuation are noted throughout the deep and periventricular white matter of the cerebral hemispheres bilaterally, compatible with mild chronic microvascular ischemic disease. No acute displaced skull fractures are identified. No acute intracranial abnormality. Specifically, no evidence of acute post-traumatic intracranial hemorrhage, no definite regions of acute/subacute cerebral ischemia, no focal mass, mass effect, hydrocephalus or abnormal intra or extra-axial fluid collections. The visualized paranasal sinuses and mastoids are well pneumatized.  IMPRESSION: 1. No acute displaced skull fractures or findings to suggest significant acute traumatic injury to the brain. 2. Mild chronic microvascular ischemic changes of the cerebral white matter.   Electronically Signed   By: Trudie Reed M.D.   On: 01/30/2013 07:57    EKG Interpretation    Date/Time:  Monday January 30 2013 06:41:01 EST Ventricular Rate:  97 PR Interval:  167 QRS Duration: 103 QT Interval:  349 QTC Calculation: 443 R Axis:   37 Text Interpretation:  Sinus rhythm Low voltage, precordial leads Nonspecific T abnormalities, diffuse leads Poor R wave progression No significant change since last tracing Confirmed by [facility]  MD, MICHEAL ([id number]) on 01/30/2013 8:05:48 AM            MDM   1. Cellulitis   2. Chest pain     Patient presenting with chest pain, history of cardiac cath and stent placement. Currently he is chest  pain-free after 2 sublingual nitroglycerin. He appears in no apparent distress. Possible social component to patient's visit today, he states he  cannot go back to his current living conditions because he cannot care for himself. Chest pain may also be from increased stress and anxiety. Labs pending- cbc, bmp, troponin, bnp, lactate, ua, uds, salicylate level, acetaminophen level. EKG, head CT, CXR and hip xray pending. Exam limited by patient's body habitus. Plan to check serial troponin, consult social work. Case discussed with attending Dr. Oletta Lamas who also evaluated patient and agrees with plan of care. 10:44 AM Pt spiked temp 103 orally. Will check rectal temp. Pt now hypotensive, 87/66, tachycardic 111. Fluid bolus, tylenol given. CXR, CT head and hip xray without acute findings. Leukocytosis of 15.7. Blood cultures and influenza panel collected. 11:17 AM Pt now resting comfortably, no tachycardia, BP 127/61. 11:39 AM Moist, erythematous, malodorous and draining rash noted under folds of abdomen with surrounding cellulitis. Will start vancomycin Zosyn, consult for admission. 11:49 AM Consult from internal medicine teaching service resident Dr. Clyde Lundborg appreciated, admission expected, attending physician Dr. Kem Kays.  Trevor Mace, PA-C 01/30/13 1150

## 2013-01-30 NOTE — ED Notes (Signed)
Pt aware of need of urine specimen, urinal at bedside for pt to use when he can

## 2013-01-30 NOTE — ED Notes (Signed)
Patient was given 2 SL nitro and 324mg  ASA en route to ED.

## 2013-01-30 NOTE — H&P (Signed)
Hospital Admission Note Date: 01/30/2013  Patient name: [name] Medical record number: [id number] Date of birth: Feb 08, 1945 Age: 68 y.o. Gender: male PCP: Barbette Reichmann, MD  Medical Service: IMTS  Attending physician: Dr. Kem Kays  Internal Medicine Teaching Service Contact Information  Weekday Hours (7AM-5PM):   1st Contact:  Dr. Aundria Rud            Pager: [phone] 2nd Contact: Dr.  Shirlee Latch           Pager:   [phone]  ** If no return call within 15 minutes (after trying both pagers listed above), please call after hours pagers.   After 5 pm or weekends: 1st Contact: Pager: [phone] 2nd Contact: Pager: [phone]  Chief Complaint: Polyuria, weakness, Fever and chest pain.   History of Present Illness:  Patient is 68 yo man with PMH of CAD s/p MI 06/22/12, HTN, HLD, DM-II (A1c 8.0 on 06/24/12), eczema, medication noncompliance, who presents with polyuria, weakness, fever and chest pain.   At baseline, patient lives alone at home. He uses walker and crutche to walk around during the daytime. Patient reports that he has not been taking his insulin in the past several months because he cannot afford it. He has increased urinary frequency, but without dysuria. He feels very tired recently, which has been progressively getting worse in the past several weeks. He also has intermittent chest pain in the past several months. It is mild and nonradiating. It happens almost every other days, each time it lasts for about several minutes. It usually resolves after having a rest and taking nitroglycerin. Patient had an another episode of chest pain last night, which made him very tired. I got up out of bed to get a glass of water, but slipped, fell on the ground, hit his head and left hip. He is not very sure whether he lost consciousness, but did not have urinary incontinence and did not bite his tongue. He called EMS by himslef. EMS gave him 2 sublingual nitroglycerin with complete relief of his chest  pain. Patient was on the floor by EMS with multiple bottles of urine and while the trash and feces are around him. He feels depressed,  but answers yes only to two out of 8 SIG-ECAPS questions (loss of interest in the daily activity and lack of energy). He initially complained of nausea and SOB to ED physician. He received IV Zofran in ED. He denies these symptoms when I evaluated him in ED. He was found to have temperature of 103.4, WBC 15.7 and no bone fracture by x-ray for left hip in ED.   ROS:  Denies headaches, cough, SOB, diarrhea, constipation, dysuria, urgency, hematuria. Has bilateral knee pain which is chronic, not acutely worsening. Has mild pain over left shoulder and left hip.    nausea, shortness of breath and vomiting    Meds: Current Outpatient Rx  Name  Route  Sig  Dispense  Refill  . prasugrel (EFFIENT) 10 MG TABS   Oral   Take 1 tablet (10 mg total) by mouth daily.   30 tablet   6   . aspirin 81 MG chewable tablet   Oral   Chew 324 mg by mouth daily.         . nitroGLYCERIN (NITROSTAT) 0.4 MG SL tablet   Sublingual   Place 1 tablet (0.4 mg total) under the tongue every 5 (five) minutes as needed for chest pain (up to 3 doses).   25 tablet   3  Allergies: Allergies as of 01/30/2013  . (No Known Allergies)   Past Medical History  Diagnosis Date  . Hypertension   . Hyperlipidemia   . Morbid obesity   . NSVT (nonsustained ventricular tachycardia)     a. 04/2012 post MI; normal LVF   . Physical deconditioning     requiring outpatient PT  . Osteoarthritis   . Lower extremity cellulitis   . Peripheral edema   . Diabetes mellitus      Insulin-dependent  . Chronic knee pain   . Anemia, unspecified   . Ischemic cardiomyopathy     a. 4.2014 EF 40% by LV gram.  . Coronary artery disease     a. s/p anterior STEMI 07/13/10: tx with Promus DES to distal LM; s/p POBA to distal LAD;  b. 04/2012 NSTEMI/Cath/PCI: LM 20%, LAD 20% p, 84m, 100d, D1 60-70ost, D2 nl, D3  nl, LCX mid dzs, OM1/2/3 min irregs, RCA dom 60ost, 90p, 45m (3.5x38 & 4.0x24 Promus DES'), EF 40%, inf HK. c. 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  Marland Kitchen Anxiety   . Depression   . Sleep apnea   . CHF (congestive heart failure)    Past Surgical History  Procedure Laterality Date  . Coronary stent placement      drug-eluting to the distal left main and PTCA of the distal left anterior descending.  . Skin graft Right     history  . Knee surgery Left   . Cataract surgery    . Cardiac catheterization    . Cardiac catheterization      x2 stents;[facility]   Family History  Problem Relation Age of Onset  . Heart attack Brother 60    died  . Coronary artery disease      strongly positive family hx of  . Heart attack Father    History   Social History  . Marital Status: Married    Spouse Name: [name]    Number of Children: 2  . Years of Education: [name]   Occupational History  . Not on file.   Social History Main Topics  . Smoking status: Never Smoker   . Smokeless tobacco: Never Used  . Alcohol Use: No  . Drug Use: No  . Sexual Activity: Not Currently   Other Topics Concern  . Not on file   Social History Narrative  .  patient lives alone at home. His wife is in SNF due to stroke. He has one daughter and one son. He denies smoking, drinking alcohol or using drugs. He feels very tired recently, could not take care of himself any more.      Review of Systems: Full 14-point review of systems otherwise negative except as noted above in HPI.  Physical Exam:   Filed Vitals:   01/30/13 1000 01/30/13 1013 01/30/13 1046 01/30/13 1139  BP: 127/88  127/61   Pulse: 109     Temp:  103 F (39.4 C)  103.4 F (39.7 C)  TempSrc:  Oral  Rectal  Resp:      SpO2: 96%       General: Not in acute distress. Morbidly obese. Dry mucous and membrane HEENT: PERRL, EOMI, no scleral icterus, No bruit. Difficult to  assess JVD or not.  Cardiac: S1/S2, RRR, No murmurs, gallops or rubs Pulm: Good air movement bilaterally. Clear to auscultation bilaterally. No rales, wheezing, rhonchi or rubs. Abd: Soft, no rebound pain, no organomegaly,  BS present Ext: trace amount of symetric leg edema, has venous insufficiency in legs,  1+DP/PT pulse bilaterally Musculoskeletal: No joint deformities, erythema, or stiffness, ROM full Skin: Erythematous moist rash under skin folds of abdomen, worse on the right side, there are several small areas with skin breakdown.  There are purulent draining,  Fluid, malodorous, wtih surrounding cellulitis.  Neuro: Alert and oriented X3, cranial nerves II-XII grossly intact, muscle strength 5/5 in all extremeties, sensation to light touch intact. Brachial reflex 2+ bilaterally. Knee reflex 1+ bilaterally. Psychiatric: His behavior is normal. His mood appears anxious. He exhibits a depressed mood. He expresses no homicidal and no suicidal ideation   Lab results: Basic Metabolic Panel:  Recent Labs  16/10/96 0830  NA 133*  K 4.7  CL 94*  CO2 28  GLUCOSE 364*  BUN 15  CREATININE 0.83  CALCIUM 8.9   Liver Function Tests: No results found for this basename: AST, ALT, ALKPHOS, BILITOT, PROT, ALBUMIN,  in the last 72 hours No results found for this basename: LIPASE, AMYLASE,  in the last 72 hours No results found for this basename: AMMONIA,  in the last 72 hours CBC:  Recent Labs  01/30/13 0830  WBC 15.7*  HGB 14.8  HCT 44.5  MCV 79.2  PLT 187   Cardiac Enzymes: No results found for this basename: CKTOTAL, CKMB, CKMBINDEX, TROPONINI,  in the last 72 hours BNP:  Recent Labs  01/30/13 0835  PROBNP 3113.0*   D-Dimer: No results found for this basename: DDIMER,  in the last 72 hours CBG: No results found for this basename: GLUCAP,  in the last 72 hours Hemoglobin A1C: No results found for this basename: HGBA1C,  in the last 72 hours Fasting Lipid Panel: No results  found for this basename: CHOL, HDL, LDLCALC, TRIG, CHOLHDL, LDLDIRECT,  in the last 72 hours Thyroid Function Tests: No results found for this basename: TSH, T4TOTAL, FREET4, T3FREE, THYROIDAB,  in the last 72 hours Anemia Panel: No results found for this basename: VITAMINB12, FOLATE, FERRITIN, TIBC, IRON, RETICCTPCT,  in the last 72 hours Coagulation: No results found for this basename: LABPROT, INR,  in the last 72 hours Urine Drug Screen: Drugs of Abuse     Component Value Date/Time   LABOPIA NONE DETECTED 01/30/2013 1011   COCAINSCRNUR NONE DETECTED 01/30/2013 1011   LABBENZ NONE DETECTED 01/30/2013 1011   AMPHETMU NONE DETECTED 01/30/2013 1011   THCU NONE DETECTED 01/30/2013 1011   LABBARB NONE DETECTED 01/30/2013 1011    Alcohol Level: No results found for this basename: ETH,  in the last 72 hours Urinalysis:  Recent Labs  01/30/13 1011  COLORURINE YELLOW  LABSPEC 1.038*  PHURINE 5.0  GLUCOSEU >1000*  HGBUR NEGATIVE  BILIRUBINUR NEGATIVE  KETONESUR 40*  PROTEINUR NEGATIVE  UROBILINOGEN 0.2  NITRITE NEGATIVE  LEUKOCYTESUR NEGATIVE   Misc. Labs:  Imaging results:  Dg Chest 1 View  01/30/2013   CLINICAL DATA:  History of fall.  Injury.  Shortness of breath.  EXAM: CHEST - 1 VIEW  COMPARISON:  Chest x-ray 07/26/2012.  FINDINGS: Lung volumes are low. No consolidative airspace disease. No pleural effusions. No pneumothorax. Mild crowding of the pulmonary vasculature, accentuated by low lung volumes, without frank pulmonary edema. Heart size is normal. Mediastinal contours are unremarkable. Bony thorax appears grossly intact.  IMPRESSION: 1. Low lung volumes without radiographic evidence of acute cardiopulmonary disease.   Electronically Signed   By: Trudie Reed M.D.   On: 01/30/2013 07:51  Dg Hip Complete Left  01/30/2013   CLINICAL DATA:  History of fall complaining of pain in the left hip.  EXAM: LEFT HIP - COMPLETE 2+ VIEW  COMPARISON:  No priors.  FINDINGS: AP view of the  pelvis and AP and lateral views of the left hip demonstrate no definite acute displaced fractures of the bony pelvic ring. Bilateral proximal femurs as visualized appear intact, and the left femoral head is located. Mild degenerative changes of osteoarthritis are noted in the hip joints bilaterally.  IMPRESSION: 1. No acute radiographic abnormality of the bony pelvis or the left hip.   Electronically Signed   By: Trudie Reedaniel  Entrikin M.D.   On: 01/30/2013 07:49   Ct Head Wo Contrast  01/30/2013   CLINICAL DATA:  History of fall with injury to the left side of the head. Head pain.  EXAM: CT HEAD WITHOUT CONTRAST  TECHNIQUE: Contiguous axial images were obtained from the base of the skull through the vertex without intravenous contrast.  COMPARISON:  No priors.  FINDINGS: Mild patchy and confluent areas of decreased attenuation are noted throughout the deep and periventricular white matter of the cerebral hemispheres bilaterally, compatible with mild chronic microvascular ischemic disease. No acute displaced skull fractures are identified. No acute intracranial abnormality. Specifically, no evidence of acute post-traumatic intracranial hemorrhage, no definite regions of acute/subacute cerebral ischemia, no focal mass, mass effect, hydrocephalus or abnormal intra or extra-axial fluid collections. The visualized paranasal sinuses and mastoids are well pneumatized.  IMPRESSION: 1. No acute displaced skull fractures or findings to suggest significant acute traumatic injury to the brain. 2. Mild chronic microvascular ischemic changes of the cerebral white matter.   Electronically Signed   By: Trudie Reedaniel  Entrikin M.D.   On: 01/30/2013 07:57    Other results:  EKG: Sinus rhythm, regular, normal axis, normal R wave progression, normal QT interval. TWI in inferior leads and V5 to V6, which is slightly worse than previous EKG on 06/28/12.  Assessment & Plan by Problem: Principal Problem:   Cellulitis Active Problems:    Coronary artery disease   Hypertension   Hyperlipidemia   Morbid obesity   IDDM (insulin dependent diabetes mellitus)   Chest pain  Patient is 68 yo man with PMH of CAD s/p MI 06/22/12, HTN, HLD, DM-II (A1c 8.0 on 06/24/12), eczema, medication noncompliance, who presents with polyuria, weakness, fever, chest pain, fall, cellulitis in abdominal wall. WBC 15.7. CT-head and left hip x-ray negative for acute issues. ProBNP 3113. Patient is clinically dry.   # Sepsis secondary to abdominal wall cellulitis: Patient has fever, leukocytosis and tachycardia. It is most likely secondary to abdominal wall cellulitis. Given his poorly controled, he needs broad antibiotics coverage at least initially. He received 1 dose of IV vancomycin and Zosyn in ED. Blood culture was drawn before antibiotics in ED. Currently patient is  hemodynamically stable. Her blood pressure is 127/61. He received 1 L of normal saline bolus in ED. Given his hx of CHF (EF of 45 %), will need to be very careful for IVF.   -will admit to tele bed -will continue IV Vancomycin and zosyn, may switch to doxycycline after becoming afebrile and negative blood culture.  -pending blood culture.   -would care consult. -PT  #: CAD: Patient has been having intermittent chest pain in the past several months, which makes ACS unlikely the diagnosis. His chest pains responded to the nitroglycerin. It is most likely that patient has a stable angina. Patient has a significant history  of CAD. He is s/p of MI and stent placemwent. Currently he is taking aspirin and Effient at Home. He is complaint to these two medications per patient. His EKG shows slightly worsening of T-wave inversion compared to previous EKG on 06/28/12. Patient may benefit from adding BB for stable angina.   - continue home ASA and Effient - trop q6h x 3  - will start Coreg 3.125 mg bid  #:  DM-II: Her A1c was 8.0 on 06/24/12. Patient was supposed to take insulin, but not compliant to  his medications. He has significant symptoms, including polyuria and fatigue. Patient's worsening weakness is most likely multifactorial, but poorly controlled diabetes is likely one of the major etiologies. CBG 364 without AG on admission.   -will treat with Lantus 10 U qhs and SSI-moderate while in hospital -will need close PCP f/u for better control of his DM-II -will consult to diabetic educator.   #: CHF: 2-D echo on 06/23/12 showed EF of 45%. Currently patient does not have signs of acute exacerbation. He is clinically dry. His body weightly did not increase significantly per patient. His oxygen saturation is 96%. Chest x-ray has no pulmonary edema. Although his proBNP is elevated, he is clinically euvolemic, or slightly dry. Patient was supposed to take Lasix (he does not know the dosage), but not compliant to his medication at home. He is likely self-duresied due to hyperglycemia.   - will not start lasix in the setting of sepsis.  - will be SL and observe closely for volume status.   #: HLD: LDL was 86 on 06/23/12. Not on medications. -will f/u as outpt  #  F/E/N  -SL:   - Electrolytes: fine - Diet:Carb modided diet  # DVT px: Heparin sq   Dispo: Disposition is deferred at this time, awaiting improvement of current medical problems. Anticipated discharge in approximately 2 day(s).   The patient does have a current PCP Barbette Reichmann, MD), therefore will be requiring OPC follow-up after discharge.   The patient does have transportation limitations that hinder transportation to clinic appointments.  Signed:  Lorretta Harp, MD PGY3, Internal Medicine Teaching Service Pager: [phone]  01/30/2013, 12:14 PM

## 2013-01-30 NOTE — ED Notes (Signed)
Pt being transported to Radiology Department for tests

## 2013-01-30 NOTE — Progress Notes (Signed)
ANTIBIOTIC CONSULT NOTE - INITIAL  Pharmacy Consult for zosyn  Indication: rule out sepsis  No Known Allergies  Patient Measurements:   Adjusted Body Weight:   Vital Signs: Temp: 100.3 F (37.9 C) (01/05 1353) Temp src: Oral (01/05 1353) BP: 131/69 mmHg (01/05 1353) Pulse Rate: 91 (01/05 1353) Intake/Output from previous day:   Intake/Output from this shift: Total I/O In: -  Out: 550 [Urine:550]  Labs:  Recent Labs  01/30/13 0830  WBC 15.7*  HGB 14.8  PLT 187  CREATININE 0.83   The CrCl is unknown because both a height and weight (above a minimum accepted value) are required for this calculation. No results found for this basename: VANCOTROUGH, VANCOPEAK, VANCORANDOM, GENTTROUGH, GENTPEAK, GENTRANDOM, TOBRATROUGH, TOBRAPEAK, TOBRARND, AMIKACINPEAK, AMIKACINTROU, AMIKACIN,  in the last 72 hours   Microbiology: No results found for this or any previous visit (from the past 720 hour(s)).  Medical History: Past Medical History  Diagnosis Date  . Hypertension   . Hyperlipidemia   . Morbid obesity   . NSVT (nonsustained ventricular tachycardia)     a. 04/2012 post MI; normal LVF   . Physical deconditioning     requiring outpatient PT  . Osteoarthritis   . Lower extremity cellulitis   . Peripheral edema   . Diabetes mellitus      Insulin-dependent  . Chronic knee pain   . Anemia, unspecified   . Ischemic cardiomyopathy     a. 4.2014 EF 40% by LV gram.  . Coronary artery disease     a. s/p anterior STEMI 07/13/10: tx with Promus DES to distal LM; s/p POBA to distal LAD;  b. 04/2012 NSTEMI/Cath/PCI: LM 20%, LAD 20% p, 3237m, 100d, D1 60-70ost, D2 nl, D3 nl, LCX mid dzs, OM1/2/3 min irregs, RCA dom 60ost, 90p, 2555m (3.5x38 & 4.0x24 Promus DES'), EF 40%, inf HK. c. 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  Marland Kitchen. Anxiety   . Depression   . Sleep apnea   . CHF (congestive heart failure)      Medications:  Prescriptions prior to admission  Medication Sig Dispense Refill  . prasugrel (EFFIENT) 10 MG TABS Take 1 tablet (10 mg total) by mouth daily.  30 tablet  6  . aspirin 81 MG chewable tablet Chew 324 mg by mouth daily.      . nitroGLYCERIN (NITROSTAT) 0.4 MG SL tablet Place 1 tablet (0.4 mg total) under the tongue every 5 (five) minutes as needed for chest pain (up to 3 doses).  25 tablet  3   Scheduled:  Assessment: 68 yo who was admitted for likely sepsis from abdominal cellulitis. Plan to empirically start vanc/zosyn and de-escalate if needed.   Goal of Therapy:  Vancomycin trough level 15-20 mcg/ml  Plan:   Zosyn 3.375g IV q8 Vancomycin 1 gm x1 to give a total of 2g load Then vanc 1.5g IV q12 Trough at steady state

## 2013-01-30 NOTE — ED Notes (Signed)
Pt status greatly improved after tylenol and zosyn.  Sitting up on side of bed speaking with admitting MD.

## 2013-01-30 NOTE — ED Provider Notes (Addendum)
Medical screening examination/treatment/procedure(s) were conducted as a shared visit with non-physician practitioner(s) and myself.  I personally evaluated the patient during the encounter.  EKG Interpretation    Date/Time:  Monday January 30 2013 06:41:01 EST Ventricular Rate:  97 PR Interval:  167 QRS Duration: 103 QT Interval:  349 QTC Calculation: 443 R Axis:   37 Text Interpretation:  Sinus rhythm Low voltage, precordial leads Nonspecific T abnormalities, diffuse leads Poor R wave progression No significant change since last tracing Confirmed by [facility]  MD, MICHEAL ([id number]) on 01/30/2013 8:05:48 AM           Patient lives alone at home after his wife was placed in a nursing facility several years ago. Patient admits that he lives in rather deplorable conditions, reports does not have significant family otherwise, has very little help at home. He has to walk around on crutches do to chronic knee pain, he has a history of morbid obesity. After slipping and falling at home, he is unable to stand, called EMS for help. Patient also admits that he's been having intermittent exertional chest discomfort over the last several months. He did see his cardiologist several months ago and reports that he is doing well from a cardiac standpoint at that time. No current chest pain, diaphoresis, shortness of breath or nausea. Patient was given some nitroglycerin because he was having some chest pain that he attributes to anxiety and stress by EMS. He feels improved at this time. Patient would be amenable to being placed in assisted living if he were able to afford it, which he does not think he can. He would be very amenable to receiving home health care needs which he has never asked for the past. His EKG shows no acute abnormalities he does have some flattened T waves, nonspecific changes laterally. However with no active chest pain, and I do not believe the patient is an active DKA, he can be followed as an  outpatient and will certainly speak to social work and her case management to see if he qualifies for home health care.  HR regular rate, rhythm RR tachypneic, no rales, rhonchi, wheezing Mental status, A&O times 3, mild depression, no SI, HI.   Abdomen is soft, obese, no guard or rebound  Impression: CP Fall Depression Hyperglycemia      Gavin PoundMichael Y. Armari Fussell, MD 01/30/13 0810     11:00 AM Pt has since spiked a fever to 103.  Pt is not hypotensive, but clincially is weak, not able to stand on his own, given all of the symptoms, will consider admission.  Blood cultures obtrained, lactic acid previously is unremarkable, doubt he is septic.  Will send influenza swabs.  On exam, possibly cellulitis is cause of fever and elevated WBC.  Gavin PoundMichael Y. Oletta LamasGhim, MD 01/30/13 1102  Gavin PoundMichael Y. Kayan Blissett, MD 01/30/13 1140

## 2013-01-30 NOTE — Progress Notes (Signed)
UR completed Marrietta Thunder K. Teofila Bowery, RN, BSN, MSHL, CCM  01/30/2013 4:50 PM

## 2013-01-30 NOTE — Care Management Note (Addendum)
  Page 2 of 2   02/02/2013     11:48:25 AM   CARE MANAGEMENT NOTE 02/02/2013  Patient:  [name], [name]   Account Number:  [id number]  Date Initiated:  01/30/2013  Documentation initiated by:  Mariann Laster  Subjective/Objective Assessment:   Admitted with  Sepsis secondary to abdominal wall cellulitis:     Action/Plan:   CM will continue to monitor for disposition needs   Anticipated DC Date:  02/02/2013   Anticipated DC Plan:  [place] referral  Clinical Social Worker      DC Planning Services  CM consult      Choice offered to / List presented to:  NA           Status of service:  Completed, signed off Medicare Important Message given?   (If response is "NO", the following Medicare IM given date fields will be blank) Date Medicare IM given:   Date Additional Medicare IM given:    Discharge Disposition:  [place]  Per UR Regulation:  Reviewed for med. necessity/level of care/duration of stay  If discussed at [place] of Stay Meetings, dates discussed:    Comments:  02/02/2013 D/c to SNF via Ambulance on 02/02/2013 Shawnee Higham RN, BSN, [place], CCM [phone])   01/31/2013 CM Consult: Last UR Date 01/30/2013 Admitted with  Sepsis secondary to abdominal wall cellulitis PT recs:  SNIF/24 hour supervision CM met with patient and discussed d/c planning needs. Social:  Home alone, DTR in [place] and son living in [place], [place] but children are not able to provide any assistance.  Patient admitts to inability to provide needed self care.  Patient agrees with SNF need at this time and further asssitance with possible ALF at point of SNF d/c. Patient owns home and take home SS 2400/month.  States applied for MCD and was income to high and max was 1600/month.  Hx/o IP SNF about one year ago and liked the care received and elects to go back to the following if bed available.  States wife is IP SNF x 1 year [facility] but does not feel going to  that facility with wife would be the right thing to do. [facility] [address] [place], [place] [address] [phone] DME:  [employer], walker used to get to and from car, crutches inside the house. Transportation:  Self - still driving Plan: SW referral:  SNF Deasia Chiu RN, BSN, [place], CCM 01/31/2013

## 2013-01-30 NOTE — ED Notes (Signed)
Order to Ambulate pt but pt unable to ambulate.  He states he can walk with crutches, but pt needed 3 staff just to assist him from a sitting position to a laying position.

## 2013-01-30 NOTE — ED Notes (Signed)
PT shivering.  Repeat temp was 103.  PA notified.

## 2013-01-30 NOTE — ED Notes (Signed)
Pt denies chest pain or sob at this time.  C/o lower back pain and knee pain which is common for him.  Denies SI, but states he is tired of living in his situation and would like to live in assisted living if he can afford it.

## 2013-01-30 NOTE — ED Notes (Signed)
Patient with chest pain that started over ago, with nausea and vomiting.  Patient denies any shortness of breath.  Does have extensive cardiac history.  CBG of 450.  Patient has not been taking any of his medications.  Patient stated to EMS that "he is sick of this shit and wanted it all to end".  Patient found on floor by EMS, but he feel out of bed last night, some time before midnight.  Patient was found with multiple bottles of urine around hospital bed, piles of trash and feces from animals.  Patient lives alone with not much family involvement.

## 2013-01-31 DIAGNOSIS — I2510 Atherosclerotic heart disease of native coronary artery without angina pectoris: Secondary | ICD-10-CM

## 2013-01-31 DIAGNOSIS — E119 Type 2 diabetes mellitus without complications: Secondary | ICD-10-CM

## 2013-01-31 DIAGNOSIS — R079 Chest pain, unspecified: Secondary | ICD-10-CM

## 2013-01-31 DIAGNOSIS — I2589 Other forms of chronic ischemic heart disease: Secondary | ICD-10-CM

## 2013-01-31 LAB — GLUCOSE, CAPILLARY
GLUCOSE-CAPILLARY: 243 mg/dL — AB (ref 70–99)
GLUCOSE-CAPILLARY: 219 mg/dL — AB (ref 70–99)
GLUCOSE-CAPILLARY: 285 mg/dL — AB (ref 70–99)
Glucose-Capillary: 244 mg/dL — ABNORMAL HIGH (ref 70–99)
Glucose-Capillary: 262 mg/dL — ABNORMAL HIGH (ref 70–99)
Glucose-Capillary: 349 mg/dL — ABNORMAL HIGH (ref 70–99)

## 2013-01-31 LAB — LIPID PANEL
CHOL/HDL RATIO: 4.6 ratio
Cholesterol: 139 mg/dL (ref 0–200)
HDL: 30 mg/dL — ABNORMAL LOW (ref >39)
LDL Cholesterol: 79 mg/dL (ref 0–99)
Triglycerides: 149 mg/dL (ref <150)
VLDL: 30 mg/dL (ref 0–40)

## 2013-01-31 LAB — CBC
HCT: 42.4 % (ref 39.0–52.0)
Hemoglobin: 14 g/dL (ref 13.0–17.0)
MCH: 26.3 pg (ref 26.0–34.0)
MCHC: 33 g/dL (ref 30.0–36.0)
MCV: 79.5 fL (ref 78.0–100.0)
Platelets: 161 10*3/uL (ref 150–400)
RBC: 5.33 MIL/uL (ref 4.22–5.81)
RDW: 15.3 % (ref 11.5–15.5)
WBC: 13.6 10*3/uL — ABNORMAL HIGH (ref 4.0–10.5)

## 2013-01-31 LAB — TROPONIN I: Troponin I: <0.3 ng/mL (ref <0.30)

## 2013-01-31 MED ORDER — SIMVASTATIN 20 MG PO TABS
20.0000 mg | ORAL_TABLET | Freq: Every day | ORAL | Status: DC
  Administered 2013-01-31 – 2013-02-01 (×2): 20 mg via ORAL
  Filled 2013-01-31 (×3): qty 1

## 2013-01-31 MED ORDER — INSULIN GLARGINE 100 UNIT/ML ~~LOC~~ SOLN
25.0000 [IU] | Freq: Every day | SUBCUTANEOUS | Status: DC
Start: 2013-02-01 — End: 2013-02-02
  Administered 2013-02-01 – 2013-02-02 (×2): 25 [IU] via SUBCUTANEOUS
  Filled 2013-01-31 (×2): qty 0.25

## 2013-01-31 MED ORDER — GI COCKTAIL ~~LOC~~
30.0000 mL | Freq: Two times a day (BID) | ORAL | Status: DC | PRN
  Filled 2013-01-31: qty 30

## 2013-01-31 MED ORDER — INSULIN ASPART 100 UNIT/ML ~~LOC~~ SOLN
0.0000 [IU] | Freq: Three times a day (TID) | SUBCUTANEOUS | Status: DC
  Administered 2013-01-31: 5 [IU] via SUBCUTANEOUS
  Administered 2013-02-01: 8 [IU] via SUBCUTANEOUS
  Administered 2013-02-01: 5 [IU] via SUBCUTANEOUS
  Administered 2013-02-01: 8 [IU] via SUBCUTANEOUS
  Administered 2013-02-02: 08:00:00 via SUBCUTANEOUS

## 2013-01-31 MED ORDER — SODIUM CHLORIDE 0.9 % IV SOLN
INTRAVENOUS | Status: AC
  Administered 2013-01-31: 19:00:00 via INTRAVENOUS

## 2013-01-31 NOTE — Evaluation (Signed)
Physical Therapy Evaluation Patient Details Name: KELDRICK POMPLUN MRN: [id number] DOB: 12-09-1945 Today's Date: 01/31/2013 Time: 4098-1191 PT Time Calculation (min): 36 min  PT Assessment / Plan / Recommendation History of Present Illness  Patient is 68 yo man with PMH of CAD s/p MI 06/22/12, HTN, HLD, DM-II (A1c 8.0 on 06/24/12), eczema, medication noncompliance, who presents with polyuria, weakness, fever and chest pain.   Clinical Impression  Pt admitted with above complications. Pt currently with functional limitations due to the deficits listed below (see PT Problem List).  Pt will benefit from skilled PT to increase their independence and safety with mobility to allow discharge to the venue listed below. Recommend SNF as pt. With significant functional decline and is unsafe to d/c home.    PT Assessment  Patient needs continued PT services    Follow Up Recommendations  SNF;Supervision/Assistance - 24 hour    Does the patient have the potential to tolerate intense rehabilitation      Barriers to Discharge Decreased caregiver support      Equipment Recommendations  None recommended by PT    Recommendations for Other Services     Frequency Min 3X/week    Precautions / Restrictions Precautions Precautions: Fall Restrictions Weight Bearing Restrictions: No   Pertinent Vitals/Pain Pt. C/o bil. Knee pain; did not rate and able to tolerate session without complaints.      Mobility  Bed Mobility Bed Mobility: Supine to Sit Supine to Sit: 4: Min assist;HOB elevated Details for Bed Mobility Assistance: increased time due to SOB, mod cues for technique Transfers Transfers: Sit to Stand;Stand to Sit;Stand Pivot Transfers Sit to Stand: 4: Min guard;From bed;From chair/3-in-1;With upper extremity assist Stand to Sit: 4: Min guard;To chair/3-in-1;With upper extremity assist Stand Pivot Transfers: 4: Min guard Details for Transfer Assistance: pt. used bed rail to push up to stand from  bed and from chair; rocking technique used from chair Ambulation/Gait Ambulation/Gait Assistance: Not tested (comment) Stairs: No Wheelchair Mobility Wheelchair Mobility: No    Exercises     PT Diagnosis: Difficulty walking;Abnormality of gait;Generalized weakness;Acute pain  PT Problem List: Decreased strength;Decreased range of motion;Decreased activity tolerance;Decreased balance;Decreased mobility;Decreased knowledge of use of DME;Decreased safety awareness;Decreased knowledge of precautions;Cardiopulmonary status limiting activity;Obesity;Pain PT Treatment Interventions: DME instruction;Gait training;Functional mobility training;Therapeutic activities;Therapeutic exercise;Balance training;Neuromuscular re-education;Patient/family education     PT Goals(Current goals can be found in the care plan section) Acute Rehab PT Goals Patient Stated Goal: to get stronger and improve mobility PT Goal Formulation: With patient Time For Goal Achievement: 02/07/13 Potential to Achieve Goals: Good  Visit Information  Last PT Received On: 01/31/13 Assistance Needed: +2 (for safety and ambulation) History of Present Illness: Patient is 68 yo man with PMH of CAD s/p MI 06/22/12, HTN, HLD, DM-II (A1c 8.0 on 06/24/12), eczema, medication noncompliance, who presents with polyuria, weakness, fever and chest pain.        Prior Functioning  Home Living Family/patient expects to be discharged to:: Unsure Prior Function Level of Independence: Independent with assistive device(s) Comments: used crutches in the house and walker outside Communication Communication: No difficulties Dominant Hand: Right    Cognition  Cognition Arousal/Alertness: Awake/alert Behavior During Therapy: WFL for tasks assessed/performed Overall Cognitive Status: Within Functional Limits for tasks assessed    Extremity/Trunk Assessment Upper Extremity Assessment Upper Extremity Assessment: Generalized weakness Lower  Extremity Assessment Lower Extremity Assessment: Generalized weakness   Balance Balance Balance Assessed: Yes Static Sitting Balance Static Sitting - Balance Support: Feet supported;No upper extremity  supported Static Sitting - Level of Assistance: 7: Independent Static Sitting - Comment/# of Minutes: 10 Static Standing Balance Static Standing - Balance Support: Right upper extremity supported;Left upper extremity supported;During functional activity Static Standing - Level of Assistance: 5: Stand by assistance  End of Session PT - End of Session Equipment Utilized During Treatment: Oxygen (gait belt not used due to pt size) Activity Tolerance: Patient tolerated treatment well;Patient limited by fatigue Patient left: in chair;with call bell/phone within reach Nurse Communication: Mobility status  GP     Moshe CiproMatthews, Belma Dyches K 01/31/2013, 10:37 AM  Clarita CraneStephanie F Shadawn Hanaway, PT, DPT [phone]

## 2013-01-31 NOTE — Progress Notes (Signed)
Subjective: [name] reports feeling "better but like I have no strength because I'm short of breath all the time" and "my back and head are bothering me." Back and head pain have responded to Tylenol in the past, and he has not received Tylenol during this hospitalization.  Pt reports 8-9/10 chest pain characterized as a "tightness on the left side... Like a charley horse" for the last month and with another episode this morning at 0300 which improved with nitro. Chest pain typically improves with rest, is localized, and does not radiate. Chest pain is inducible with rapid breathing and worsens with deep breathing and leaning forward. Chest pain does not worsen with meals and pt denies history of GERD or burning sensation.   Pt continues to endorse difficulty breathing with continual SOB. He does not use supplemental oxygen at home and sleeps upright due to SOB.   Pt continues to have bilateral knee pain that has lasted for years. He reports an inability to move his legs without pain.   Pt denies subjective fever but endorses chills for the last two nights with improvement this morning.    Pt denies any other complaints or concerns at this time. Negative lab and imaging results were discussed with the patient, and the patient requested facility placement due to inability to care for himself at home.     Objective: Vital signs in last 24 hours: Filed Vitals:   01/31/13 0150 01/31/13 0514 01/31/13 0558 01/31/13 0925  BP: 147/63 133/64  111/33  Pulse: 89 85  77  Temp: 98.7 F (37.1 C)     TempSrc: Oral     Resp: 20     Height:      Weight:   224.848 kg (495 lb 11.2 oz)   SpO2: 98%      Weight change:   Intake/Output Summary (Last 24 hours) at 01/31/13 1146 Last data filed at 01/30/13 1826  Gross per 24 hour  Intake    223 ml  Output    400 ml  Net   -177 ml    Physical Exam BP 111/33  Pulse 77  Temp(Src) 98.7 F (37.1 C) (Oral)  Resp 20  Ht 5\' 11"  (1.803 m)  Wt 224.848 kg  (495 lb 11.2 oz)  BMI 69.17 kg/m2  SpO2 98%  General: Alert, well-appearing male sitting in NAD. Appears stated age. Pulmonary: CTAB. No increased WOB noted. Cardiovascular: RRR, S1 and S2 normal, no murmur, rub, or gallop appreciated. Abdomen: Bowel sounds active. Soft, NTND, no masses, no organomegaly. Skin beneath abdominal pannus was examined and was found to be erythematous bilaterally with regions of skin breakdown and some regmaining purulent regions on the left. Extremities: All four extremities warm, well-perfused, without cyanosis or edema. Eczematous and erythematous regions in bilateral lower extremities  Neurologic: Alert and cooperative during interview.   Psychiatric: Behavior is normal. Mood is anxious at times but does not exhibit depressed mood at present.  Lab Results:  Recent Labs Lab 01/30/13 0830  NA 133*  K 4.7  CL 94*  CO2 28  GLUCOSE 364*  BUN 15  CREATININE 0.83  CALCIUM 8.9  GFRNONAA 90*  GFRAA >90     Recent Labs Lab 01/30/13 0830 01/31/13 0245  WBC 15.7* 13.6*  HGB 14.8 14.0  HCT 44.5 42.4  PLT 187 161    Micro Results: Recent Results (from the past 240 hour(s))  CULTURE, BLOOD (ROUTINE X 2)     Status: None   Collection Time  01/30/13 10:35 AM      Result Value Range Status   Specimen Description BLOOD RIGHT ANTECUBITAL   Final   Special Requests BOTTLES DRAWN AEROBIC ONLY   Final   Culture  Setup Time     Final   Value: 01/30/2013 14:54     Performed at [facility]   Culture     Final   Value:        BLOOD CULTURE RECEIVED NO GROWTH TO DATE CULTURE WILL BE HELD FOR 5 DAYS BEFORE ISSUING A FINAL NEGATIVE REPORT     Performed at [facility]   Report Status PENDING   Incomplete  CULTURE, BLOOD (ROUTINE X 2)     Status: None   Collection Time    01/30/13 10:55 AM      Result Value Range Status   Specimen Description BLOOD LEFT ANTECUBITAL   Final   Special Requests BOTTLES DRAWN AEROBIC ONLY   Final    Culture  Setup Time     Final   Value: 01/30/2013 14:54     Performed at [facility]   Culture     Final   Value:        BLOOD CULTURE RECEIVED NO GROWTH TO DATE CULTURE WILL BE HELD FOR 5 DAYS BEFORE ISSUING A FINAL NEGATIVE REPORT     Performed at [facility]   Report Status PENDING   Incomplete    Studies/Results: Dg Chest 1 View  01/30/2013   CLINICAL DATA:  History of fall.  Injury.  Shortness of breath.  EXAM: CHEST - 1 VIEW  COMPARISON:  Chest x-ray 07/26/2012.  FINDINGS: Lung volumes are low. No consolidative airspace disease. No pleural effusions. No pneumothorax. Mild crowding of the pulmonary vasculature, accentuated by low lung volumes, without frank pulmonary edema. Heart size is normal. Mediastinal contours are unremarkable. Bony thorax appears grossly intact.  IMPRESSION: 1. Low lung volumes without radiographic evidence of acute cardiopulmonary disease.   Electronically Signed   By: Trudie Reed M.D.   On: 01/30/2013 07:51   Dg Hip Complete Left  01/30/2013   CLINICAL DATA:  History of fall complaining of pain in the left hip.  EXAM: LEFT HIP - COMPLETE 2+ VIEW  COMPARISON:  No priors.  FINDINGS: AP view of the pelvis and AP and lateral views of the left hip demonstrate no definite acute displaced fractures of the bony pelvic ring. Bilateral proximal femurs as visualized appear intact, and the left femoral head is located. Mild degenerative changes of osteoarthritis are noted in the hip joints bilaterally.  IMPRESSION: 1. No acute radiographic abnormality of the bony pelvis or the left hip.   Electronically Signed   By: Trudie Reed M.D.   On: 01/30/2013 07:49   Ct Head Wo Contrast  01/30/2013   CLINICAL DATA:  History of fall with injury to the left side of the head. Head pain.  EXAM: CT HEAD WITHOUT CONTRAST  TECHNIQUE: Contiguous axial images were obtained from the base of the skull through the vertex without intravenous contrast.  COMPARISON:  No priors.   FINDINGS: Mild patchy and confluent areas of decreased attenuation are noted throughout the deep and periventricular white matter of the cerebral hemispheres bilaterally, compatible with mild chronic microvascular ischemic disease. No acute displaced skull fractures are identified. No acute intracranial abnormality. Specifically, no evidence of acute post-traumatic intracranial hemorrhage, no definite regions of acute/subacute cerebral ischemia, no focal mass, mass effect, hydrocephalus or abnormal intra  or extra-axial fluid collections. The visualized paranasal sinuses and mastoids are well pneumatized.  IMPRESSION: 1. No acute displaced skull fractures or findings to suggest significant acute traumatic injury to the brain. 2. Mild chronic microvascular ischemic changes of the cerebral white matter.   Electronically Signed   By: Trudie Reed M.D.   On: 01/30/2013 07:57    Medications: Patient's current medications were reviewed by Dr. Aundria Rud. Scheduled Meds: . aspirin  324 mg Oral Daily  . carvedilol  3.125 mg Oral BID WC  . heparin  5,000 Units Subcutaneous Q8H  . insulin aspart  0-15 Units Subcutaneous Q4H  . insulin glargine  10 Units Subcutaneous Daily  . piperacillin-tazobactam (ZOSYN)  IV  3.375 g Intravenous Q8H  . prasugrel  10 mg Oral Daily  . simvastatin  20 mg Oral q1800  . sodium chloride  3 mL Intravenous Q12H  . sodium chloride  3 mL Intravenous Q12H  . vancomycin  1,500 mg Intravenous Q12H   Continuous Infusions:  PRN Meds:.sodium chloride, acetaminophen, gi cocktail, nitroGLYCERIN, ondansetron (ZOFRAN) IV, sodium chloride   Assessment/Plan: Principal Problem:   Cellulitis of abdominal wall Active Problems:   Coronary artery disease   Hypertension   Hyperlipidemia   Morbid obesity   Chest pain   Sepsis   DM (diabetes mellitus), type 2   History of medication noncompliance  Patient is 68 yo man with PMH of CAD s/p MI 06/22/12, HTN, HLD, DM-II (A1c 8.0 on 06/24/12),  eczema, medication noncompliance, who presents with polyuria, weakness, fever, chest pain, fall, cellulitis in abdominal wall. WBC has improved from 15.7 to 13.6. CT-head and left hip x-ray negative for acute issues. ProBNP 3113. Tropinins negative x 3. Patient is clinically dry.   # Sepsis 2/2 abdominal wall cellulitis: Patient presented with fever, leukocytosis and tachycardia, likely 2/2 abdominal wall cellulitis. He was given 1 dose of IV vancomycin and Zosyn in the ED. Blood cultures were drawn before antibiotics were given. Patient is hemodynamically stable at present. Last BP at 0925 was 111/33. -- continue IV Vancomycin and Zosyn for now. May switch to doxycycline after becoming afebrile and negative blood culture -- pending blood culture  -- would care was consulted -- PT was consulted  #: CAD and Chest Pain: Patient has been having intermittent chest pain in the past several months, which makes ACS unlikely the diagnosis. His chest pains responded to the nitroglycerin. It is most likely that patient has a stable angina. Patient has a significant history of CAD. He is s/p of MI and stent placement. Currently he is compliant with aspirin and Effient at home. His EKG shows slightly worsening of T-wave inversion compared to previous EKG on 06/28/12.  -- continue home ASA and Effient, and Coreg 3.125 mg bid -- troponins q6h x 3 were negative -- Cardiology consulted for stress test and management recommendations -- given GI cocktail 30 mL PO BID prn for possible GERD symptoms, although this is less likely given symptoms  #: DM-II: His A1c was 8.0 on 06/24/12. Patient was supposed to take insulin, but not compliant to his medications. He has significant symptoms, including polyuria and fatigue. Patient's worsening weakness is most likely multifactorial, but poorly controlled diabetes is likely one of the major etiologies. CBG 364 without AG on admission.  -- continue Lantus 10 U qhs and SSI-moderate  while in hospital  -- contacted PCP for recent patient labs and records. Patient was last seen in May 2014. Will discuss with PCP f/u for better  control of his DM-II  -- will consult to diabetic educator.   #: CHF: 2-D echo on 06/23/12 showed EF of 45%. Currently patient does not have signs of acute exacerbation. He is clinically dry. His body weightly did not increase significantly per patient. His oxygen saturation is 96%. Chest x-ray has no pulmonary edema. Although his proBNP is elevated, he is clinically euvolemic, or slightly dry. Patient was supposed to take Lasix (he does not know the dosage), but not compliant to his medication at home. He is likely self-duresied due to hyperglycemia.  -- Will not start lasix in the setting of sepsis.  - will be SL and observe closely for volume status.   #: HLD: LDL was 86 on 06/23/12. Not on medications.  -- Lipid Panel sent stat, still pending -- Pt given 1 dose of Simvastatin 20 mg, pending lipid panel  #: Back pain and headache -- Given Tylenol 650 mg PO q4h prn for pain  # F/E/N  -- s/p 1L NS bolus in ED. Given his hx of CHF (EF of 45 %), will need to be very careful for IVF.  -- Electrolytes: wnl -- Diet:Carb modified diet   # DVT ppx:  -- Heparin 5000 U subcutaneous q8h  # Discharge Plans: -- Case Management notified about seeking out facility placement due to patient's poor mobility and inability to care for self at home  Dispo: Disposition is deferred at this time, awaiting improvement of current medical problems. Anticipated discharge in approximately 1 day(s).   The patient does have a current PCP Barbette Reichmann, MD), therefore will be requiring OPC follow-up after discharge.   The patient does have transportation limitations that hinder transportation to clinic appointments.  Contacts/follow up: Patient's PCP Dr. Marcello Fennel at [facility] [phone]) - records obtained from recent clinic visits with most recent in May  2014.  This is a Psychologist, occupational Note.  The care of the patient was discussed with Dr. Aundria Rud and the assessment and plan formulated with their assistance.  Please see their attached note for official documentation of the daily encounter.   LOS: 1 day   Rande Brunt, Med Student 01/31/2013, 11:46 AM

## 2013-01-31 NOTE — H&P (Signed)
INTERNAL MEDICINE TEACHING SERVICE Attending Admission Note  Date: 01/31/2013  Patient name: [name] R Viall  Medical record number: [id number]  Date of birth: 10-Jun-1945    I have seen and evaluated Craig DuffelNeil R Gebhart and discussed their care with the Residency Team.  67 yr. Old male with pmhx significant for obesity, HTN, CAD, Type 2 DM, HL, presented with fever, weakness, polyuria, CP. He states that it is becoming more difficult for him to take care of himself. He admits to intermittent CP improved with rest and taking nitroglycerin. He admits to a recent fall in which he did not have LOC, but hit is head and left hip. In the ED, he was noted to be febrile, have a leukocytosis of 15k, and no findings of left hip fracture or intracranial process/fracture by CT. On exam, he has erythema and skin breakdown of abdominal pannus. He has some areas of draining purulence. He is clinically dry. I agree with Sepsis due to abdominal wall cellulitis. BC obtained. Continue to treat with Vanc and Zosyn for now. Wound consult. Given his CAD history and CP history, he has ruled out for ACS. Consult cards to evaluate him for stress testing. He does not appear volume overloaded. EF is 45% by TTE on 05/2012. Would not start lasix just yet, would give 500 cc NS at 100 cc/hr.  He has uncontrolled DM. Agree with Lantus and Novolog.  He does not feel he can go home and take care of himself, CM and SW to look into placement.  Jonah BlueAlejandro Briley Bumgarner, DO, FACP Faculty [facility] 01/31/2013, 10:27 AM

## 2013-01-31 NOTE — Progress Notes (Signed)
Notified Dr. Aundria Rudogers of patient having chest pain earlier this morning. Orders given to continue to monitor patient to end of shift.

## 2013-01-31 NOTE — Consult Note (Signed)
CARDIOLOGY CONSULT NOTE   Patient ID: [name] MRN: [id number], DOB/AGE: 1945/08/03   Admit date: 01/30/2013 Date of Consult: 01/31/2013   Primary Physician: Craig Reichmann, MD Primary Cardiologist: Dr. Kirke Corin  Pt. Profile  68 year old male admitted with weakness.  Problem List  Past Medical History  Diagnosis Date  . Hypertension   . Hyperlipidemia   . Morbid obesity   . NSVT (nonsustained ventricular tachycardia)     a. 04/2012 post MI; normal LVF   . Physical deconditioning     requiring outpatient PT  . Osteoarthritis   . Lower extremity cellulitis   . Peripheral edema   . Diabetes mellitus      Insulin-dependent  . Chronic knee pain   . Anemia, unspecified   . Ischemic cardiomyopathy     a. 4.2014 EF 40% by LV gram.  . Coronary artery disease     a. s/p anterior STEMI 07/13/10: tx with Promus DES to distal LM; s/p POBA to distal LAD;  b. 04/2012 NSTEMI/Cath/PCI: LM 20%, LAD 20% p, 53m, 100d, D1 60-70ost, D2 nl, D3 nl, LCX mid dzs, OM1/2/3 min irregs, RCA dom 60ost, 90p, 65m (3.5x38 & 4.0x24 Promus DES'), EF 40%, inf HK. c. 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  Marland Kitchen Anxiety   . Depression   . Sleep apnea   . CHF (congestive heart failure)     Past Surgical History  Procedure Laterality Date  . Coronary stent placement      drug-eluting to the distal left main and PTCA of the distal left anterior descending.  . Skin graft Right     history  . Knee surgery Left   . Cataract surgery    . Cardiac catheterization    . Cardiac catheterization      x2 stents;MC     Allergies  No Known Allergies  HPI   This 68 year old gentleman was admitted on 01/30/13 after an episode of sudden weakness at home.  He states that he had walked with his crutches from his bedroom to his kitchen to get a glass of water.  On the way back he became weak and as he tried to get back into bed he fell to the  floor.  He did not lose consciousness.  He managed to crawl to his cell phone and called 911 and was brought to the hospital.  He was complaining of some chest discomfort and shortness of breath without radiation.  Subsequently his chest discomfort has resolved.  His serum troponins are normal x3.  His electrocardiogram showed nonspecific T-wave changes in the inferolateral leads on admission.  Repeat EKG is pending. The patient has known prior ischemic heart disease.  He is followed by Dr. Kirke Corin and was last seen in the office on 08/26/12.   In 2012, he had anterior elevation myocardial infarction and was noted to have severe left main coronary artery stenosis due to plaque rupture. He was not a candidate for CABG due to morbid obesity. He underwent angioplasty and drug-eluting stent placement without complications. He presented in April of 2014 with non-ST elevation myocardial infarction. He was transferred to [facility] where Dr. Kirke Corin performed cardiac catheterization via the right radial artery. This showed patent left main stent. He was found to have subtotal thrombotic occlusion of the right coronary artery with large thrombus. He underwent successful thrombectomy and 2 overlapped drug-eluting stent placements to the mid and proximal RCA.  He presented  6 weeks later with inferior ST elevation MI needed to late stent thrombosis after he stopped taking Effient. Dr. Kirke Corin treated him with thrombectomy and balloon angioplasty. Ejection fraction was normal.  He has had a history of atypical chest pain since his myocardial infarction.  He has a history of fluid retention and is on Lasix 20 mg once daily but does not take it on a regular basis.  The patient is on lifelong dual antiplatelet therapy with aspirin and prasugrel.  The patient states that this morning he was given some antacid pills and his chest discomfort resolved promptly and has not returned.   Inpatient Medications  . aspirin  324 mg Oral Daily  .  carvedilol  3.125 mg Oral BID WC  . heparin  5,000 Units Subcutaneous Q8H  . insulin aspart  0-15 Units Subcutaneous Q4H  . [START ON 02/01/2013] insulin glargine  25 Units Subcutaneous Daily  . piperacillin-tazobactam (ZOSYN)  IV  3.375 g Intravenous Q8H  . prasugrel  10 mg Oral Daily  . simvastatin  20 mg Oral q1800  . sodium chloride  3 mL Intravenous Q12H  . sodium chloride  3 mL Intravenous Q12H  . vancomycin  1,500 mg Intravenous Q12H    Family History Family History  Problem Relation Age of Onset  . Heart attack Brother 60    died  . Coronary artery disease      strongly positive family hx of  . Heart attack Father      Social History History   Social History  . Marital Status: Married    Spouse Name: [name]    Number of Children: 2  . Years of Education: [name]   Occupational History  . Not on file.   Social History Main Topics  . Smoking status: Never Smoker   . Smokeless tobacco: Never Used  . Alcohol Use: No  . Drug Use: No  . Sexual Activity: Not Currently   Other Topics Concern  . Not on file   Social History Narrative  . No narrative on file     Review of Systems  General:  No chills, fever, night sweats or weight changes.  Cardiovascular:  No chest pain, dyspnea on exertion, edema, orthopnea, palpitations, paroxysmal nocturnal dyspnea. Dermatological: No rash, lesions/masses Respiratory: No cough, dyspnea Urologic: No hematuria, dysuria Abdominal:   No nausea, vomiting, diarrhea, bright red blood per rectum, melena, or hematemesis.  Positive for morbid obesity Neurologic:  No visual changes, wkns, changes in mental status. All other systems reviewed and are otherwise negative except as noted above.  Physical Exam  Blood pressure 111/33, pulse 77, temperature 98.7 F (37.1 C), temperature source Oral, resp. rate 20, height 5\' 11"  (1.803 m), weight 495 lb 11.2 oz (224.848 kg), SpO2 98.00%.  General: Pleasant, NAD.  Morbidly obese Psych: Normal  affect. Neuro: Alert and oriented X 3. Moves all extremities spontaneously. HEENT: Normal  Neck: Supple without bruits or JVD. Lungs:  Resp regular and unlabored, CTA.  Shallow inspiratory effort secondary to obesity Heart: RRR no s3, s4, or murmurs. Abdomen: Soft, non-tender, non-distended, BS + x 4.  Cellulitis below panus Extremities: There is chronic pretibial and pedal edema.    Labs   Recent Labs  01/30/13 1632 01/30/13 2200 01/31/13 0245  TROPONINI <0.30 <0.30 <0.30   Lab Results  Component Value Date   WBC 13.6* 01/31/2013   HGB 14.0 01/31/2013   HCT 42.4 01/31/2013   MCV 79.5 01/31/2013   PLT 161 01/31/2013  Recent Labs Lab 01/30/13 0830  NA 133*  K 4.7  CL 94*  CO2 28  BUN 15  CREATININE 0.83  CALCIUM 8.9  GLUCOSE 364*   Lab Results  Component Value Date   CHOL 139 01/31/2013   HDL 30* 01/31/2013   LDLCALC 79 01/31/2013   TRIG 149 01/31/2013   No results found for this basename: DDIMER    Radiology/Studies  Dg Chest 1 View  01/30/2013   CLINICAL DATA:  History of fall.  Injury.  Shortness of breath.  EXAM: CHEST - 1 VIEW  COMPARISON:  Chest x-ray 07/26/2012.  FINDINGS: Lung volumes are low. No consolidative airspace disease. No pleural effusions. No pneumothorax. Mild crowding of the pulmonary vasculature, accentuated by low lung volumes, without frank pulmonary edema. Heart size is normal. Mediastinal contours are unremarkable. Bony thorax appears grossly intact.  IMPRESSION: 1. Low lung volumes without radiographic evidence of acute cardiopulmonary disease.   Electronically Signed   By: Trudie Reed M.D.   On: 01/30/2013 07:51   Dg Hip Complete Left  01/30/2013   CLINICAL DATA:  History of fall complaining of pain in the left hip.  EXAM: LEFT HIP - COMPLETE 2+ VIEW  COMPARISON:  No priors.  FINDINGS: AP view of the pelvis and AP and lateral views of the left hip demonstrate no definite acute displaced fractures of the bony pelvic ring. Bilateral proximal femurs  as visualized appear intact, and the left femoral head is located. Mild degenerative changes of osteoarthritis are noted in the hip joints bilaterally.  IMPRESSION: 1. No acute radiographic abnormality of the bony pelvis or the left hip.   Electronically Signed   By: Trudie Reed M.D.   On: 01/30/2013 07:49   Ct Head Wo Contrast  01/30/2013   CLINICAL DATA:  History of fall with injury to the left side of the head. Head pain.  EXAM: CT HEAD WITHOUT CONTRAST  TECHNIQUE: Contiguous axial images were obtained from the base of the skull through the vertex without intravenous contrast.  COMPARISON:  No priors.  FINDINGS: Mild patchy and confluent areas of decreased attenuation are noted throughout the deep and periventricular white matter of the cerebral hemispheres bilaterally, compatible with mild chronic microvascular ischemic disease. No acute displaced skull fractures are identified. No acute intracranial abnormality. Specifically, no evidence of acute post-traumatic intracranial hemorrhage, no definite regions of acute/subacute cerebral ischemia, no focal mass, mass effect, hydrocephalus or abnormal intra or extra-axial fluid collections. The visualized paranasal sinuses and mastoids are well pneumatized.  IMPRESSION: 1. No acute displaced skull fractures or findings to suggest significant acute traumatic injury to the brain. 2. Mild chronic microvascular ischemic changes of the cerebral white matter.   Electronically Signed   By: Trudie Reed M.D.   On: 01/30/2013 07:57    ECG EKG on 01/30/13 shows normal sinus rhythm and nonspecific inferolateral T wave inversion.  ASSESSMENT AND PLAN 1. atypical chest pain apparently relieved with oral antacids earlier today 2. known coronary artery disease with previous anterior wall STEMI and with previous inferior wall, status post successful stenting. 3. morbid obesity 4. abdominal wall cellulitis  Recommendation: Will repeat his EKG today to look for any  serial changes.  His last echocardiogram was very suboptimal technically because of his morbid obesity and I do not think repeating his echocardiogram would be of value at this time.  I am reassured by his normal troponin levels.  There is no indication for recatheterization at this point.  His chest  pain is now relieved possibly by GI cocktail.  No further inpatient cardiac workup is indicated at this time.  He should continue to followup as scheduled with Dr. Kirke CorinArida in the office. I discussed the importance of significant weight loss with the patient. Lifelong dual antiplatelet therapy continues to be very important for him.  Signed, Cassell Clementhomas Tashanna Dolin, MD  01/31/2013, 2:22 PM

## 2013-01-31 NOTE — Progress Notes (Signed)
Notified on call doctor, Dr. Johna Rolesabbani of patient being on Q4 CBGs and asked if ok to be ACHS. Orders given for patient to be ACHS fingerstick and SSI. Will continue to monitor patient to end of shift.

## 2013-01-31 NOTE — Progress Notes (Signed)
Patient transferred from chair to bed with two assist and standard rolling walker. Patient tolerated it with some difficulty when trying to get out of the bed but was able to manage to push himself out of chair with assistance. Once patient was standing, he was able to take small steps to head of bed. Once in the bed, pt comfortable and complained of no pain or discomfort. Will continue to monitor patient to end of shift.

## 2013-01-31 NOTE — Progress Notes (Signed)
   I have seen the patient and reviewed the daily progress note by Rande BruntKim Vuong MS 3 and discussed the care of the patient with them.  See separate note for documentation of my findings, assessment, and plans.  Rocco SereneMorgan Tieshia Rettinger, MD 01/31/2013, 3:49 PM

## 2013-01-31 NOTE — Progress Notes (Signed)
Patient notified nurse that having chest pain 5/10. EKG done, vss, and nitroglycerin tab given. Peripheral IV also was dislodged and blood was on gown and sheets of patient. Patient was cleaned up by nurse and nurse tech. Nitroglycerin tabs not available in pyxis but once pharmacy sent it patient stated chest pain 3/10. Asked patient if he still wanted nitroglycerin tab and patient stated he wasn't sure. Patient stated that the has pain like this all the time and states that he wonders if its indigestion. He also so says it always goes away with one nitroglycerin tab. Therefore asked patient if he was 3/10 at home would he take it and he stated yes. Therefore nitroglycerin tab was given per PRN order. Once given, about five minutes later patient states pain was gone. Notified on-call doctor. Will continue to monitor to end of shift.

## 2013-01-31 NOTE — Progress Notes (Signed)
Report given to receiving RN. Patient is stable with no complaints and no signs or symptoms of distress. 

## 2013-01-31 NOTE — Progress Notes (Signed)
Subjective: [name] is doing better this morning though still complaining of left sided intermittent chest pain/tightness associated with SOB and relieved with NTG.  He is concerned his chest pain may be due to reflux though no association with eating.   Objective: Vital signs in last 24 hours: Filed Vitals:   01/31/13 0150 01/31/13 0514 01/31/13 0558 01/31/13 0925  BP: 147/63 133/64  111/33  Pulse: 89 85  77  Temp: 98.7 F (37.1 C)     TempSrc: Oral     Resp: 20     Height:      Weight:   495 lb 11.2 oz (224.848 kg)   SpO2: 98%      Weight change:   Intake/Output Summary (Last 24 hours) at 01/31/13 1020 Last data filed at 01/30/13 1826  Gross per 24 hour  Intake    223 ml  Output    950 ml  Net   -727 ml   PEX Exam limited 2/2 body habitus.  General: alert, cooperative, and NAD; morbidly obese HEENT: NCAT, no scleral icterus  Neck: supple, no lymphadenopathy Lungs: clear to ascultation bilaterally, normal work of respiration, no wheezes, rales, ronchi Heart: regular rate and rhythm, no murmurs, gallops, or rubs Abdomen: soft, non-tender, non-distended, normal bowel sounds Extremities: 2+ DP/PT pulses bilaterally, no cyanosis, clubbing, or edema Neurologic: alert & oriented X3, cranial nerves II-XII intact, strength grossly intact, sensation intact to light touch Skin: erythematous moist rash consistent with intertrigo under large abdominal pannus (L > R), some purulent malodorous discharge as well; BLE venous stasis  Lab Results: Basic Metabolic Panel:  Recent Labs Lab 01/30/13 0830  NA 133*  K 4.7  CL 94*  CO2 28  GLUCOSE 364*  BUN 15  CREATININE 0.83  CALCIUM 8.9   CBC:  Recent Labs Lab 01/30/13 0830 01/31/13 0245  WBC 15.7* 13.6*  HGB 14.8 14.0  HCT 44.5 42.4  MCV 79.2 79.5  PLT 187 161   Cardiac Enzymes:  Recent Labs Lab 01/30/13 1632 01/30/13 2200 01/31/13 0245  TROPONINI <0.30 <0.30 <0.30   BNP:  Recent Labs Lab 01/30/13 0835    PROBNP 3113.0*   CBG:  Recent Labs Lab 01/30/13 1705 01/30/13 2102 01/31/13 0020 01/31/13 0604 01/31/13 0742  GLUCAP 348* 349* 244* 243* 219*   Hemoglobin A1C:  Recent Labs Lab 01/30/13 1632  HGBA1C 11.0*   Coagulation:  Recent Labs Lab 01/30/13 1632  LABPROT 13.1  INR 1.01   Urine Drug Screen: Drugs of Abuse     Component Value Date/Time   LABOPIA NONE DETECTED 01/30/2013 1011   COCAINSCRNUR NONE DETECTED 01/30/2013 1011   LABBENZ NONE DETECTED 01/30/2013 1011   AMPHETMU NONE DETECTED 01/30/2013 1011   THCU NONE DETECTED 01/30/2013 1011   LABBARB NONE DETECTED 01/30/2013 1011    Alcohol Level:  Recent Labs Lab 01/30/13 1632  ETH <11   Urinalysis:  Recent Labs Lab 01/30/13 1011  COLORURINE YELLOW  LABSPEC 1.038*  PHURINE 5.0  GLUCOSEU >1000*  HGBUR NEGATIVE  BILIRUBINUR NEGATIVE  KETONESUR 40*  PROTEINUR NEGATIVE  UROBILINOGEN 0.2  NITRITE NEGATIVE  LEUKOCYTESUR NEGATIVE    Micro Results: Recent Results (from the past 240 hour(s))  CULTURE, BLOOD (ROUTINE X 2)     Status: None   Collection Time    01/30/13 10:35 AM      Result Value Range Status   Specimen Description BLOOD RIGHT ANTECUBITAL   Final   Special Requests BOTTLES DRAWN AEROBIC ONLY 10ML   Final  Culture  Setup Time     Final   Value: 01/30/2013 14:54     Performed at [facility]   Culture     Final   Value:        BLOOD CULTURE RECEIVED NO GROWTH TO DATE CULTURE WILL BE HELD FOR 5 DAYS BEFORE ISSUING A FINAL NEGATIVE REPORT     Performed at [facility]   Report Status PENDING   Incomplete  CULTURE, BLOOD (ROUTINE X 2)     Status: None   Collection Time    01/30/13 10:55 AM      Result Value Range Status   Specimen Description BLOOD LEFT ANTECUBITAL   Final   Special Requests BOTTLES DRAWN AEROBIC ONLY 4ML   Final   Culture  Setup Time     Final   Value: 01/30/2013 14:54     Performed at [facility]   Culture     Final   Value:        BLOOD CULTURE  RECEIVED NO GROWTH TO DATE CULTURE WILL BE HELD FOR 5 DAYS BEFORE ISSUING A FINAL NEGATIVE REPORT     Performed at [facility]   Report Status PENDING   Incomplete   Studies/Results: Dg Chest 1 View  01/30/2013   CLINICAL DATA:  History of fall.  Injury.  Shortness of breath.  EXAM: CHEST - 1 VIEW  COMPARISON:  Chest x-ray 07/26/2012.  FINDINGS: Lung volumes are low. No consolidative airspace disease. No pleural effusions. No pneumothorax. Mild crowding of the pulmonary vasculature, accentuated by low lung volumes, without frank pulmonary edema. Heart size is normal. Mediastinal contours are unremarkable. Bony thorax appears grossly intact.  IMPRESSION: 1. Low lung volumes without radiographic evidence of acute cardiopulmonary disease.   Electronically Signed   By: Vinnie Langton M.D.   On: 01/30/2013 07:51   Dg Hip Complete Left  01/30/2013   CLINICAL DATA:  History of fall complaining of pain in the left hip.  EXAM: LEFT HIP - COMPLETE 2+ VIEW  COMPARISON:  No priors.  FINDINGS: AP view of the pelvis and AP and lateral views of the left hip demonstrate no definite acute displaced fractures of the bony pelvic ring. Bilateral proximal femurs as visualized appear intact, and the left femoral head is located. Mild degenerative changes of osteoarthritis are noted in the hip joints bilaterally.  IMPRESSION: 1. No acute radiographic abnormality of the bony pelvis or the left hip.   Electronically Signed   By: Vinnie Langton M.D.   On: 01/30/2013 07:49   Ct Head Wo Contrast  01/30/2013   CLINICAL DATA:  History of fall with injury to the left side of the head. Head pain.  EXAM: CT HEAD WITHOUT CONTRAST  TECHNIQUE: Contiguous axial images were obtained from the base of the skull through the vertex without intravenous contrast.  COMPARISON:  No priors.  FINDINGS: Mild patchy and confluent areas of decreased attenuation are noted throughout the deep and periventricular white matter of the cerebral  hemispheres bilaterally, compatible with mild chronic microvascular ischemic disease. No acute displaced skull fractures are identified. No acute intracranial abnormality. Specifically, no evidence of acute post-traumatic intracranial hemorrhage, no definite regions of acute/subacute cerebral ischemia, no focal mass, mass effect, hydrocephalus or abnormal intra or extra-axial fluid collections. The visualized paranasal sinuses and mastoids are well pneumatized.  IMPRESSION: 1. No acute displaced skull fractures or findings to suggest significant acute traumatic injury to the brain. 2. Mild chronic microvascular ischemic  changes of the cerebral white matter.   Electronically Signed   By: Vinnie Langton M.D.   On: 01/30/2013 07:57   Medications: I have reviewed the patient's current medications. Scheduled Meds: . aspirin  324 mg Oral Daily  . carvedilol  3.125 mg Oral BID WC  . heparin  5,000 Units Subcutaneous Q8H  . insulin aspart  0-15 Units Subcutaneous Q4H  . insulin glargine  10 Units Subcutaneous Daily  . piperacillin-tazobactam (ZOSYN)  IV  3.375 g Intravenous Q8H  . prasugrel  10 mg Oral Daily  . simvastatin  20 mg Oral q1800  . sodium chloride  3 mL Intravenous Q12H  . sodium chloride  3 mL Intravenous Q12H  . vancomycin  1,500 mg Intravenous Q12H  :  PRN Meds:.sodium chloride, acetaminophen, gi cocktail, nitroGLYCERIN, ondansetron (ZOFRAN) IV, sodium chloride  Assessment/Plan: #Sepsis secondary to abdominal wall cellulitis: On admission, patient met 3/4 SIRS criteria (fever, leukocytosis and tachycardia) with source most likely abdominal cellulitis under large pannus. WBC 15.7 --> 13.6. UA, UDS negative.  -continue IV vancomycin and zosyn; convert to doxycycline tomorrow -wound care consult, appreciate recs -blood cultures x 2 pending -PT- recommending SNF which patient is amenable to   # Chest pain with history of CAD: See above for description. Most likely stable angina vs.  GERD. Patient has history of CAD and MI s/p stent.  EKG unchanged, troponins x 3 negative, pBNP 3113 (1937 three months ago).  -cardiology consult for possible nuclear stress test -will try GI cocktail to see if this relieves pain -continue home ASA and Effient  -continue Coreg 3.125 mg BID  # DM2: A1C 11%. Non-compliance with insulin for past several months and reporting including polyuria and fatigue.   -increase Lantus to 25 units qhs -SSI-moderate -will need close PCP follow-up   # CHF: Echo in 05/2012 showed EF of 45%. Non-compliance with Lasix at home, but patient does not appear volume overloaded on exam.  CXR negative. pBNP elevated but clinically euvolemic or even slightly dry. -NS at 100 cc/hr x 5 hr  -continue to monitor  # HLD: LDL 86 in 05/2012. Not currently on statin (but may have been taking previously) -lipid profile -start simvistatin 20 mg daily   Dispo: Disposition is deferred at this time, awaiting improvement of current medical problems.  Anticipated discharge in approximately 1-2 day(s).   The patient does have a current PCP (Tracie Harrier, MD) and does need an [facility] hospital follow-up appointment after discharge.   .Services Needed at time of discharge: Y = Yes, Blank = No PT:   OT:   RN:   Equipment:   Other:     LOS: 1 day   Ivin Poot, MD 01/31/2013, 10:20 AM

## 2013-01-31 NOTE — Consult Note (Addendum)
WOC wound consult note Reason for Consult: Requested to assess abd cellulitis. Pt states he has trouble with chronic breakdown to abd skin folds prior to admission.  He has a large pannus. Wound type: Partail thickness breakdown to area between abd skin folds; red and moist, appearance consistent with intertrigo. Affected areas to left and right abd are approx 10X10X.1cm Pressure Ulcer POA: These are NOT pressure ulcers. Drainage (amount, consistency, odor) Mod amt yellow drainage, some odor. Dressing procedure/placement/frequency: Directions given to staff and Interdry silver-impregnated fabric ordered. It is antimicrobial and wicks moisture away from the skin. This should remain in place for 5 days for optimal plan of care to promote healing.  Discussed use with patient and bedside nurse. Pt is concerned with .5X.5cm red tender area slightly raised area near right nipple.  No open wound or drainage. No need for topical treatment at this time.  Please have primary team assess this site for further input. Please re-consult if further assistance is needed.  Thank-you,  Cammie Mcgeeawn Kenyon Eshleman MSN, RN, CWOCN, ArabiWCN-AP, CNS [phone]

## 2013-01-31 NOTE — Progress Notes (Signed)
Inpatient Diabetes Program Recommendations  AACE/ADA: New Consensus Statement on Inpatient Glycemic Control (2013)  Target Ranges:  Prepandial:   less than 140 mg/dL      Peak postprandial:   less than 180 mg/dL (1-2 hours)      Critically ill patients:  140 - 180 mg/dL  Results for [name], [name] (MRN [id number]) as of 01/31/2013 12:34  Ref. Range 01/30/2013 21:02 01/31/2013 00:20 01/31/2013 06:04 01/31/2013 07:42 01/31/2013 11:31  Glucose-Capillary Latest Range: 70-99 mg/dL 782349 (H) 956244 (H) 213243 (H) 219 (H) 285 (H)   Inpatient Diabetes Program Recommendations Insulin - Basal: Increase Lantus to 25 units  Correction (SSI): change to TID ac + HS scale per Glycemic Control order set  Insulin - Meal Coverage: may also need prandial insulin dose TID Thank you  Piedad ClimesGina Nusaiba Guallpa BSN, RN,CDE Inpatient Diabetes Coordinator [phone] (team pager)

## 2013-02-01 DIAGNOSIS — L02219 Cutaneous abscess of trunk, unspecified: Secondary | ICD-10-CM

## 2013-02-01 DIAGNOSIS — Z794 Long term (current) use of insulin: Secondary | ICD-10-CM

## 2013-02-01 DIAGNOSIS — L03319 Cellulitis of trunk, unspecified: Secondary | ICD-10-CM

## 2013-02-01 DIAGNOSIS — A419 Sepsis, unspecified organism: Principal | ICD-10-CM

## 2013-02-01 LAB — GLUCOSE, CAPILLARY
GLUCOSE-CAPILLARY: 241 mg/dL — AB (ref 70–99)
GLUCOSE-CAPILLARY: 238 mg/dL — AB (ref 70–99)
Glucose-Capillary: 232 mg/dL — ABNORMAL HIGH (ref 70–99)
Glucose-Capillary: 279 mg/dL — ABNORMAL HIGH (ref 70–99)

## 2013-02-01 LAB — CBC
HCT: 41.2 % (ref 39.0–52.0)
Hemoglobin: 13 g/dL (ref 13.0–17.0)
MCH: 25.3 pg — ABNORMAL LOW (ref 26.0–34.0)
MCHC: 31.6 g/dL (ref 30.0–36.0)
MCV: 80.2 fL (ref 78.0–100.0)
PLATELETS: 159 10*3/uL (ref 150–400)
RBC: 5.14 MIL/uL (ref 4.22–5.81)
RDW: 15.2 % (ref 11.5–15.5)
WBC: 8.8 10*3/uL (ref 4.0–10.5)

## 2013-02-01 LAB — BASIC METABOLIC PANEL
BUN: 16 mg/dL (ref 6–23)
CALCIUM: 8.2 mg/dL — AB (ref 8.4–10.5)
CO2: 27 mEq/L (ref 19–32)
Chloride: 100 mEq/L (ref 96–112)
Creatinine, Ser: 0.75 mg/dL (ref 0.50–1.35)
GFR calc Af Amer: >90 mL/min (ref >90)
Glucose, Bld: 258 mg/dL — ABNORMAL HIGH (ref 70–99)
Potassium: 3.7 mEq/L (ref 3.7–5.3)
SODIUM: 137 meq/L (ref 137–147)

## 2013-02-01 LAB — URINE CULTURE
Colony Count: NO GROWTH
Culture: NO GROWTH

## 2013-02-01 MED ORDER — DOXYCYCLINE HYCLATE 100 MG PO TABS: 100.0000 mg | ORAL_TABLET | Freq: Two times a day (BID) | ORAL | Status: DC

## 2013-02-01 MED ORDER — ACETAMINOPHEN 325 MG PO TABS: 650.0000 mg | ORAL_TABLET | ORAL | Status: AC | PRN

## 2013-02-01 MED ORDER — INSULIN GLARGINE 100 UNIT/ML ~~LOC~~ SOLN: 25.0000 [IU] | Freq: Every day | SUBCUTANEOUS | Status: DC

## 2013-02-01 MED ORDER — DM-GUAIFENESIN ER 30-600 MG PO TB12
1.0000 | ORAL_TABLET | Freq: Once | ORAL | Status: AC
  Administered 2013-02-02: 03:00:00 1 via ORAL
  Filled 2013-02-01: qty 1

## 2013-02-01 MED ORDER — CARVEDILOL 3.125 MG PO TABS: 3.1250 mg | ORAL_TABLET | Freq: Two times a day (BID) | ORAL | Status: DC

## 2013-02-01 MED ORDER — DOXYCYCLINE HYCLATE 100 MG PO TABS
100.0000 mg | ORAL_TABLET | Freq: Two times a day (BID) | ORAL | Status: DC
  Administered 2013-02-01 – 2013-02-02 (×3): 100 mg via ORAL
  Filled 2013-02-01 (×4): qty 1

## 2013-02-01 MED ORDER — PANTOPRAZOLE SODIUM 40 MG PO TBEC
40.0000 mg | DELAYED_RELEASE_TABLET | Freq: Every day | ORAL | Status: DC
  Administered 2013-02-01 – 2013-02-02 (×2): 40 mg via ORAL
  Filled 2013-02-01 (×2): qty 1

## 2013-02-01 MED ORDER — ASPIRIN 81 MG PO CHEW: 81.0000 mg | CHEWABLE_TABLET | Freq: Every day | ORAL | Status: DC

## 2013-02-01 MED ORDER — SIMVASTATIN 20 MG PO TABS: 20.0000 mg | ORAL_TABLET | Freq: Every day | ORAL | Status: DC

## 2013-02-01 MED ORDER — SODIUM CHLORIDE 0.9 % IV SOLN: INTRAVENOUS | Status: DC

## 2013-02-01 MED ORDER — INSULIN ASPART 100 UNIT/ML ~~LOC~~ SOLN: 5.0000 [IU] | Freq: Three times a day (TID) | SUBCUTANEOUS | Status: DC

## 2013-02-01 MED ORDER — PANTOPRAZOLE SODIUM 40 MG PO TBEC: 40.0000 mg | DELAYED_RELEASE_TABLET | Freq: Every day | ORAL | Status: DC

## 2013-02-01 NOTE — Discharge Instructions (Signed)
Please take all of your medicines as prescribed. This is VERY important!  We are sending you out on an oral antibiotic for the infection you had on your belly.   We have also arranged follow-up at your primary doctor's office.  Cellulitis Cellulitis is an infection of the skin and the tissue under the skin. The infected area is usually red and tender. This happens most often in the arms and lower legs. HOME CARE   Take your antibiotic medicine as told. Finish the medicine even if you start to feel better.  Keep the infected arm or leg raised (elevated).  Put a warm cloth on the area up to 4 times per day.  Only take medicines as told by your doctor.  Keep all doctor visits as told. GET HELP RIGHT AWAY IF:   You have a fever.  You feel very sleepy.  You throw up (vomit) or have watery poop (diarrhea).  You feel sick and have muscle aches and pains.  You see red streaks on the skin coming from the infected area.  Your red area gets bigger or turns a dark color.  Your bone or joint under the infected area is painful after the skin heals.  Your infection comes back in the same area or different area.  You have a puffy (swollen) bump in the infected area.  You have new symptoms. MAKE SURE YOU:   Understand these instructions.  Will watch your condition.  Will get help right away if you are not doing well or get worse. Document Released: 07/01/2007 Document Revised: 07/14/2011 Document Reviewed: 03/30/2011 [facility] Patient Information 2014 [place], [place].

## 2013-02-01 NOTE — Progress Notes (Signed)
  I have seen and examined the patient, and reviewed the daily progress note by Rande BruntKim Vuong, MS 3 and discussed the care of the patient with them. Please see my progress note from 02/01/2013 for further details regarding assessment and plan.    Signed:  Rocco SereneMorgan Raine Blodgett, MD 02/01/2013, 11:39 AM

## 2013-02-01 NOTE — Progress Notes (Signed)
Patient Name: [name] Date of Encounter: 02/01/2013     Principal Problem:   Cellulitis of abdominal wall Active Problems:   Coronary artery disease   Hypertension   Hyperlipidemia   Morbid obesity   Chest pain   Sepsis   DM (diabetes mellitus), type 2   History of medication noncompliance    SUBJECTIVE No further chest pain. Rhythm NSR  CURRENT MEDS . aspirin  324 mg Oral Daily  . carvedilol  3.125 mg Oral BID WC  . heparin  5,000 Units Subcutaneous Q8H  . insulin aspart  0-15 Units Subcutaneous TID WC & HS  . insulin glargine  25 Units Subcutaneous Daily  . piperacillin-tazobactam (ZOSYN)  IV  3.375 g Intravenous Q8H  . prasugrel  10 mg Oral Daily  . simvastatin  20 mg Oral q1800  . sodium chloride  3 mL Intravenous Q12H  . sodium chloride  3 mL Intravenous Q12H  . vancomycin  1,500 mg Intravenous Q12H    OBJECTIVE  Filed Vitals:   01/31/13 0925 01/31/13 2141 02/01/13 0546 02/01/13 0701  BP: 111/33 129/46 139/62 119/64  Pulse: 77 73 71 69  Temp:  98.2 F (36.8 C) 98.2 F (36.8 C) 97.4 F (36.3 C)  TempSrc:  Oral Oral Oral  Resp:  18 22 19   Height:      Weight:    423 lb 1 oz (191.9 kg)  SpO2:  97% 95% 95%    Intake/Output Summary (Last 24 hours) at 02/01/13 0853 Last data filed at 02/01/13 0310  Gross per 24 hour  Intake   1130 ml  Output    701 ml  Net    429 ml   Filed Weights   01/30/13 1353 01/31/13 0558 02/01/13 0701  Weight: 394 lb (178.717 kg) 495 lb 11.2 oz (224.848 kg) 423 lb 1 oz (191.9 kg)    PHYSICAL EXAM  General: Pleasant, NAD. Morbidly obese  Psych: Normal affect.  Neuro: Alert and oriented X 3. Moves all extremities spontaneously.  HEENT: Normal  Neck: Supple without bruits or JVD.  Lungs: Resp regular and unlabored, CTA. Shallow inspiratory effort secondary to obesity  Heart: RRR no s3, s4, or murmurs.  Abdomen: Soft, non-tender, non-distended, BS + x 4. Cellulitis below panus  Extremities: There is chronic pretibial  and pedal edema.    Accessory Clinical Findings  CBC  Recent Labs  01/31/13 0245 02/01/13 0325  WBC 13.6* 8.8  HGB 14.0 13.0  HCT 42.4 41.2  MCV 79.5 80.2  PLT 161 159   Basic Metabolic Panel  Recent Labs  01/30/13 0830 02/01/13 0325  NA 133* 137  K 4.7 3.7  CL 94* 100  CO2 28 27  GLUCOSE 364* 258*  BUN 15 16  CREATININE 0.83 0.75  CALCIUM 8.9 8.2*   Liver Function Tests No results found for this basename: AST, ALT, ALKPHOS, BILITOT, PROT, ALBUMIN,  in the last 72 hours No results found for this basename: LIPASE, AMYLASE,  in the last 72 hours Cardiac Enzymes  Recent Labs  01/30/13 1632 01/30/13 2200 01/31/13 0245  TROPONINI <0.30 <0.30 <0.30   BNP No components found with this basename: POCBNP,  D-Dimer No results found for this basename: DDIMER,  in the last 72 hours Hemoglobin A1C  Recent Labs  01/30/13 1632  HGBA1C 11.0*   Fasting Lipid Panel  Recent Labs  01/31/13 1150  CHOL 139  HDL 30*  LDLCALC 79  TRIG [id number]  CHOLHDL 4.6   Thyroid  Function Tests No results found for this basename: TSH, T4TOTAL, FREET3, T3FREE, THYROIDAB,  in the last 72 hours  TELE  NSR  ECG  NSR with nonspecific T wave inversion.  Radiology/Studies  Dg Chest 1 View  01/30/2013   CLINICAL DATA:  History of fall.  Injury.  Shortness of breath.  EXAM: CHEST - 1 VIEW  COMPARISON:  Chest x-ray 07/26/2012.  FINDINGS: Lung volumes are low. No consolidative airspace disease. No pleural effusions. No pneumothorax. Mild crowding of the pulmonary vasculature, accentuated by low lung volumes, without frank pulmonary edema. Heart size is normal. Mediastinal contours are unremarkable. Bony thorax appears grossly intact.  IMPRESSION: 1. Low lung volumes without radiographic evidence of acute cardiopulmonary disease.   Electronically Signed   By: Trudie Reed M.D.   On: 01/30/2013 07:51   Dg Hip Complete Left  01/30/2013   CLINICAL DATA:  History of fall complaining of pain  in the left hip.  EXAM: LEFT HIP - COMPLETE 2+ VIEW  COMPARISON:  No priors.  FINDINGS: AP view of the pelvis and AP and lateral views of the left hip demonstrate no definite acute displaced fractures of the bony pelvic ring. Bilateral proximal femurs as visualized appear intact, and the left femoral head is located. Mild degenerative changes of osteoarthritis are noted in the hip joints bilaterally.  IMPRESSION: 1. No acute radiographic abnormality of the bony pelvis or the left hip.   Electronically Signed   By: Trudie Reed M.D.   On: 01/30/2013 07:49   Ct Head Wo Contrast  01/30/2013   CLINICAL DATA:  History of fall with injury to the left side of the head. Head pain.  EXAM: CT HEAD WITHOUT CONTRAST  TECHNIQUE: Contiguous axial images were obtained from the base of the skull through the vertex without intravenous contrast.  COMPARISON:  No priors.  FINDINGS: Mild patchy and confluent areas of decreased attenuation are noted throughout the deep and periventricular white matter of the cerebral hemispheres bilaterally, compatible with mild chronic microvascular ischemic disease. No acute displaced skull fractures are identified. No acute intracranial abnormality. Specifically, no evidence of acute post-traumatic intracranial hemorrhage, no definite regions of acute/subacute cerebral ischemia, no focal mass, mass effect, hydrocephalus or abnormal intra or extra-axial fluid collections. The visualized paranasal sinuses and mastoids are well pneumatized.  IMPRESSION: 1. No acute displaced skull fractures or findings to suggest significant acute traumatic injury to the brain. 2. Mild chronic microvascular ischemic changes of the cerebral white matter.   Electronically Signed   By: Trudie Reed M.D.   On: 01/30/2013 07:57    ASSESSMENT AND PLAN  1. atypical chest pain.  No evidence of infarction or NSTEMI this admission. 2. known coronary artery disease with previous anterior wall STEMI and with  previous inferior wall, status post successful stenting.  3. morbid obesity  4. abdominal wall cellulitis  Plan: Continue current cardiac meds. ASA can be reduced to 81 mg at discharge.Consider adding Protonix since some of his chest discomfort seems GI and he is on DAPT.  Okay for discharge soon from cardiac standpoint.  Signed, Cassell Clement MD

## 2013-02-01 NOTE — Progress Notes (Signed)
Subjective: [name] reports feeling much better today. He had some back pain and headache last night but this pain was well-controlled with Tylenol. Patient originally had requested Flexeril last night due to success with this in the past, but the preference of using Tylenol for his pain controlled was discussed with the patient and he agreed. He successfully ambulated from the bed to the bathroom and back yesterday with nursing assistance and did not have any oxygen desaturation (96+% on RA per nursing). His chest pain has resolved since the GI cocktail given yesterday, and he has never been on a PPI. His PO intake has improved. He continues to have right nipple tenderness at the site of a possible abscess that has lasted for 1 month.   Objective: Vital signs in last 24 hours: Filed Vitals:   01/31/13 0925 01/31/13 2141 02/01/13 0546 02/01/13 0701  BP: 111/33 129/46 139/62 119/64  Pulse: 77 73 71 69  Temp:  98.2 F (36.8 C) 98.2 F (36.8 C) 97.4 F (36.3 C)  TempSrc:  Oral Oral Oral  Resp:  $Remo'18 22 19  'NAJvz$ Height:      Weight:    191.9 kg (423 lb 1 oz)  SpO2:  97% 95% 95%   Weight change: 13.183 kg (29 lb 1 oz)  Intake/Output Summary (Last 24 hours) at 02/01/13 1056 Last data filed at 02/01/13 1029  Gross per 24 hour  Intake   1370 ml  Output   1201 ml  Net    169 ml    Physical Exam BP 119/64  Pulse 69  Temp(Src) 97.4 F (36.3 C) (Oral)  Resp 19  Ht $R'5\' 11"'VT$  (1.803 m)  Wt 191.9 kg (423 lb 1 oz)  BMI 59.03 kg/m2  SpO2 95%  General: Alert, well-appearing male sitting in NAD. Appears stated age. Pulmonary: CTAB. No increased WOB. Cardiovascular: RRR, S1 and S2 normal, no murmur, rub, or gallop appreciated. Abdomen: Bowel sounds active. Soft, NTND, no masses, no organomegaly. Abdominal skin beneath his pannus continues to be erythematous bilaterally with regions of skin breakdown. This region is no longer purulent regions on the left. Extremities: All four extremities warm,  well-perfused, without cyanosis. Erythematous regions with 2+ pitting edema in bilateral lower extremities suggestive of venous stasis. Neurologic: Alert and cooperative during interview.   Psychiatric: Behavior is normal. Mood is no longer anxious or depressed.  Lab Results:  Recent Labs Lab 01/30/13 0830 02/01/13 0325  NA 133* 137  K 4.7 3.7  CL 94* 100  CO2 28 27  GLUCOSE 364* 258*  BUN 15 16  CREATININE 0.83 0.75  CALCIUM 8.9 8.2*  GFRNONAA 90* >90  GFRAA >90 >90     Recent Labs Lab 01/30/13 0830 01/31/13 0245 02/01/13 0325  WBC 15.7* 13.6* 8.8  HGB 14.8 14.0 13.0  HCT 44.5 42.4 41.2  PLT 187 161 159    Micro Results: Recent Results (from the past 240 hour(s))  URINE CULTURE     Status: None   Collection Time    01/30/13 10:11 AM      Result Value Range Status   Specimen Description URINE, RANDOM   Final   Special Requests ADDED [id number] 2150   Final   Culture  Setup Time     Final   Value: 01/30/2013 23:37     Performed at [facility]     Final   Value: NO GROWTH     Performed at [facility]  Final   Value: NO GROWTH     Performed at [facility]   Report Status 02/01/2013 FINAL   Final  CULTURE, BLOOD (ROUTINE X 2)     Status: None   Collection Time    01/30/13 10:35 AM      Result Value Range Status   Specimen Description BLOOD RIGHT ANTECUBITAL   Final   Special Requests BOTTLES DRAWN AEROBIC ONLY 10ML   Final   Culture  Setup Time     Final   Value: 01/30/2013 14:54     Performed at [facility]   Culture     Final   Value:        BLOOD CULTURE RECEIVED NO GROWTH TO DATE CULTURE WILL BE HELD FOR 5 DAYS BEFORE ISSUING A FINAL NEGATIVE REPORT     Performed at [facility]   Report Status PENDING   Incomplete  CULTURE, BLOOD (ROUTINE X 2)     Status: None   Collection Time    01/30/13 10:55 AM      Result Value Range Status   Specimen Description BLOOD LEFT ANTECUBITAL    Final   Special Requests BOTTLES DRAWN AEROBIC ONLY 4ML   Final   Culture  Setup Time     Final   Value: 01/30/2013 14:54     Performed at [facility]   Culture     Final   Value:        BLOOD CULTURE RECEIVED NO GROWTH TO DATE CULTURE WILL BE HELD FOR 5 DAYS BEFORE ISSUING A FINAL NEGATIVE REPORT     Performed at [facility]   Report Status PENDING   Incomplete    Studies/Results: No results found.  Medications: Patient's current medications were reviewed by Dr. Stann Mainland. Scheduled Meds: . aspirin  324 mg Oral Daily  . carvedilol  3.125 mg Oral BID WC  . doxycycline  100 mg Oral Q12H  . heparin  5,000 Units Subcutaneous Q8H  . insulin aspart  0-15 Units Subcutaneous TID WC & HS  . insulin glargine  25 Units Subcutaneous Daily  . pantoprazole  40 mg Oral Daily  . prasugrel  10 mg Oral Daily  . simvastatin  20 mg Oral q1800  . sodium chloride  3 mL Intravenous Q12H  . sodium chloride  3 mL Intravenous Q12H   Continuous Infusions:  PRN Meds:.sodium chloride, acetaminophen, gi cocktail, nitroGLYCERIN, ondansetron (ZOFRAN) IV, sodium chloride   Assessment/Plan: Principal Problem:   Cellulitis of abdominal wall Active Problems:   Coronary artery disease   Hypertension   Hyperlipidemia   Morbid obesity   Chest pain   Sepsis   DM (diabetes mellitus), type 2   History of medication noncompliance  Patient is 68 yo man with PMH of CAD s/p MI 06/22/12, HTN, HLD, DM-II (A1c 8.0 on 06/24/12), eczema, medication noncompliance, who presents with polyuria, weakness, fever, chest pain, fall, cellulitis in abdominal wall. WBC has improved from 15.7 to 13.6. CT-head and left hip x-ray negative for acute issues. ProBNP 3113. Tropinins negative x 3. Patient is clinically dry.   #Sepsis secondary to abdominal wall cellulitis: On admission, patient met 3/4 SIRS criteria (fever, leukocytosis and tachycardia) with source most likely abdominal cellulitis under large pannus. WBC  15.7 --> 13.6 --> 8.8. UA, UDS negative.  -discontinue IV vancomycin and zosyn -start doxycycline 100mg  BID PO to complete 10 days of antibioitics, to be stopped on 02/08/13 -wound care consult, appreciate recs  -blood cultures  x 2 pending - NGTD  # Chest pain with history of CAD: Patient has a history of CAD and MI s/p stent. EKG remains unchanged, troponins x 3 negative, pBNP 3113 (1937 three months ago). Patient reports relief of chest pain with GI cocktail. Most likely GERD vs. Stable angina.  -continue GI cocktail prn for pain relief -start Protonix 40 mg PO daily today -continue home ASA and Effient  -continue Coreg 3.125 mg BID  -Cardiology consult on 01/31/13 with repeated EKG - recommended no further work-up  # DM2: A1C 11%. Non-compliance with insulin for past several months. Patient reports polyuria and fatigue. Total daily insulin requirement on 01/31/13 was 41 units.  -continue Lantus to 25 units qhs  -SSI-moderate  -will need close PCP follow-up with plans to use Lantus 25 units qhs and insulin 5 units TID with meals.  # CHF: Echo in 05/2012 showed EF of 45%. Non-compliance with Lasix at home, but patient does not appear volume overloaded on exam. CXR negative. pBNP elevated but clinically euvolemic or even slightly dry.  -discontinue NS at 100 cc/hr x 5 hr  -will continue to monitor   # HLD: LDL of 79 on 01/31/13 (86 in 05/2012) -continue Simvistatin 20 mg PO daily, started yesterday -Lipid Panel on 01/31/13 with LDL of 79  #: Back pain and headache -continue Tylenol 650 mg PO q4h prn for pain  #: Tender raised area near right nipple, possible abscess- no drainage or open wound noted  -start warm compresses over right nipple region -Wound Consult on 01/31/13 stated I&D or topical treatment was not indicated at this time  # F/E/N  -s/p 1L NS bolus in ED. Given his hx of CHF (EF of 45 %), will need to be very careful with IVF -saline locked -Electrolytes: wnl -Diet:Carb modified  diet   # DVT ppx:  -Heparin 5000 U subcutaneous q8h  Dispo: Anticipated discharge today to SNF. Patient requests [facility]. The patient does have a current PCP (Tracie Harrier, MD) and does need an [facility] hospital follow-up appointment after discharge. The patient does have transportation limitations that hinder transportation to clinic appointments.   Contacts/follow up: Patient's son Herbie [place] [phone]), Patient's daughter Sharyn Lull [phone]) Patient's PCP Dr. Ginette Pitman at [facility] [phone]) - records obtained from recent clinic visits with most recent in May 2014. Follow-up appointment scheduled for 02/08/13 at 1:30pm.   This is a Careers information officer Note.  The care of the patient was discussed with Dr. Stann Mainland and the assessment and plan formulated with their assistance.  Please see their attached note for official documentation of the daily encounter.   LOS: 2 days   Maryclare Bean, Med Student 02/01/2013, 10:56 AM

## 2013-02-01 NOTE — Progress Notes (Signed)
Subjective: [name] is doing better this morning, chest pain free ever since he took GI cocktail yesterday.  Able to ambulate to the bathroom now.   Objective: Vital signs in last 24 hours: Filed Vitals:   01/31/13 0925 01/31/13 2141 02/01/13 0546 02/01/13 0701  BP: 111/33 129/46 139/62 119/64  Pulse: 77 73 71 69  Temp:  98.2 F (36.8 C) 98.2 F (36.8 C) 97.4 F (36.3 C)  TempSrc:  Oral Oral Oral  Resp:  $Remo'18 22 19  'PdIlL$ Height:      Weight:    423 lb 1 oz (191.9 kg)  SpO2:  97% 95% 95%   Weight change: 29 lb 1 oz (13.183 kg)  Intake/Output Summary (Last 24 hours) at 02/01/13 0753 Last data filed at 02/01/13 0310  Gross per 24 hour  Intake   1370 ml  Output    701 ml  Net    669 ml   PEX Exam limited 2/2 body habitus.  General: alert, cooperative, and NAD; morbidly obese HEENT: NCAT, no scleral icterus  Neck: supple, no lymphadenopathy Lungs: clear to ascultation bilaterally, normal work of respiration, no wheezes, rales, ronchi Heart: regular rate and rhythm, no murmurs, gallops, or rubs Abdomen: soft, non-tender, non-distended, normal bowel sounds Extremities: 2+ DP/PT pulses bilaterally, no cyanosis, clubbing, or edema Neurologic: alert & oriented X3, cranial nerves II-XII intact, strength grossly intact, sensation intact to light touch Skin: erythematous moist rash consistent with intertrigo under large abdominal pannus (L > R) but improved since yesterday, no discharge seen today; BLE venous stasis  Lab Results: Basic Metabolic Panel:  Recent Labs Lab 01/30/13 0830 02/01/13 0325  NA 133* 137  K 4.7 3.7  CL 94* 100  CO2 28 27  GLUCOSE 364* 258*  BUN 15 16  CREATININE 0.83 0.75  CALCIUM 8.9 8.2*   CBC:  Recent Labs Lab 01/31/13 0245 02/01/13 0325  WBC 13.6* 8.8  HGB 14.0 13.0  HCT 42.4 41.2  MCV 79.5 80.2  PLT 161 159   Cardiac Enzymes:  Recent Labs Lab 01/30/13 1632 01/30/13 2200 01/31/13 0245  TROPONINI <0.30 <0.30 <0.30   BNP:  Recent  Labs Lab 01/30/13 0835  PROBNP 3113.0*   CBG:  Recent Labs Lab 01/31/13 0604 01/31/13 0742 01/31/13 1131 01/31/13 1652 01/31/13 2136 02/01/13 0700  GLUCAP 243* 219* 285* 262* 241* 232*   Hemoglobin A1C:  Recent Labs Lab 01/30/13 1632  HGBA1C 11.0*   Coagulation:  Recent Labs Lab 01/30/13 1632  LABPROT 13.1  INR 1.01   Urine Drug Screen: Drugs of Abuse     Component Value Date/Time   LABOPIA NONE DETECTED 01/30/2013 1011   COCAINSCRNUR NONE DETECTED 01/30/2013 1011   LABBENZ NONE DETECTED 01/30/2013 1011   AMPHETMU NONE DETECTED 01/30/2013 1011   THCU NONE DETECTED 01/30/2013 1011   LABBARB NONE DETECTED 01/30/2013 1011    Alcohol Level:  Recent Labs Lab 01/30/13 1632  ETH <11   Urinalysis:  Recent Labs Lab 01/30/13 1011  COLORURINE YELLOW  LABSPEC 1.038*  PHURINE 5.0  GLUCOSEU >1000*  HGBUR NEGATIVE  BILIRUBINUR NEGATIVE  KETONESUR 40*  PROTEINUR NEGATIVE  UROBILINOGEN 0.2  NITRITE NEGATIVE  LEUKOCYTESUR NEGATIVE    Micro Results: Recent Results (from the past 240 hour(s))  URINE CULTURE     Status: None   Collection Time    01/30/13 10:11 AM      Result Value Range Status   Specimen Description URINE, RANDOM   Final   Special Requests ADDED [id number]  2150   Final   Culture  Setup Time     Final   Value: 01/30/2013 23:37     Performed at [facility]     Final   Value: NO GROWTH     Performed at [facility]   Culture     Final   Value: NO GROWTH     Performed at [facility]   Report Status 02/01/2013 FINAL   Final  CULTURE, BLOOD (ROUTINE X 2)     Status: None   Collection Time    01/30/13 10:35 AM      Result Value Range Status   Specimen Description BLOOD RIGHT ANTECUBITAL   Final   Special Requests BOTTLES DRAWN AEROBIC ONLY 10ML   Final   Culture  Setup Time     Final   Value: 01/30/2013 14:54     Performed at [facility]   Culture     Final   Value:        BLOOD CULTURE RECEIVED NO  GROWTH TO DATE CULTURE WILL BE HELD FOR 5 DAYS BEFORE ISSUING A FINAL NEGATIVE REPORT     Performed at [facility]   Report Status PENDING   Incomplete  CULTURE, BLOOD (ROUTINE X 2)     Status: None   Collection Time    01/30/13 10:55 AM      Result Value Range Status   Specimen Description BLOOD LEFT ANTECUBITAL   Final   Special Requests BOTTLES DRAWN AEROBIC ONLY 4ML   Final   Culture  Setup Time     Final   Value: 01/30/2013 14:54     Performed at [facility]   Culture     Final   Value:        BLOOD CULTURE RECEIVED NO GROWTH TO DATE CULTURE WILL BE HELD FOR 5 DAYS BEFORE ISSUING A FINAL NEGATIVE REPORT     Performed at [facility]   Report Status PENDING   Incomplete   Medications: I have reviewed the patient's current medications. Scheduled Meds: . aspirin  324 mg Oral Daily  . carvedilol  3.125 mg Oral BID WC  . heparin  5,000 Units Subcutaneous Q8H  . insulin aspart  0-15 Units Subcutaneous TID WC & HS  . insulin glargine  25 Units Subcutaneous Daily  . piperacillin-tazobactam (ZOSYN)  IV  3.375 g Intravenous Q8H  . prasugrel  10 mg Oral Daily  . simvastatin  20 mg Oral q1800  . sodium chloride  3 mL Intravenous Q12H  . sodium chloride  3 mL Intravenous Q12H  . vancomycin  1,500 mg Intravenous Q12H  : . sodium chloride 100 mL/hr at 02/01/13 0752   PRN Meds:.sodium chloride, acetaminophen, gi cocktail, nitroGLYCERIN, ondansetron (ZOFRAN) IV, sodium chloride  Assessment/Plan: #Sepsis secondary to abdominal wall cellulitis: On admission, patient met 3/4 SIRS criteria (fever, leukocytosis and tachycardia) with source most likely abdominal cellulitis under large pannus. WBC 15.7 --> 13.6 --> 8.8 today.  UA, UDS negative.  -convert to doxycycline 100 mg PO BID, total 10 day abx course (today is day 3) -blood cultures x 2 NGTD -PT- recommending SNF which patient is amenable to  -social work faxing information to previous SNF with discharge today  assuming bed available  # Chest pain (resolved) with history of CAD:  Most likely due to GERD as pain resolved with GI cocktail. Patient has history of CAD and MI s/p stent.  EKG  unchanged, troponins x 3 negative, pBNP 3113 (1937 three months ago).   -cardiology consult, appreciate recs- no further cardiac follow-up  -continue home ASA and Effient  -continue Coreg 3.125 mg BID  #GERD- see above -start Protonix 40 mg daily -continue GI cocktail BID prn   # DM2: A1C 11%. Non-compliance with insulin for past several months and reporting including polyuria and fatigue.  Total daily dose of insulin yesterday 41 units.  -Lantus to 25 units qhs (first dose this morning) -SSI-moderate while inpatient; will prescribe prandial insulin 5 units TID AC at discharge -close PCP follow-up arranged for up-titration of insulin as needed   # CHF: Echo in 05/2012 showed EF of 45%. Non-compliance with Lasix at home, but patient does not appear volume overloaded on exam.  CXR negative. pBNP elevated but clinically euvolemic or even slightly dry.  # HLD: LDL 86 in 05/2012 --> 79. Not currently on statin (but may have been taking previously) -continue simvistatin 20 mg daily   Dispo:  Anticipated discharge today.   The patient does have a current PCP (Tracie Harrier, MD) and does need an [facility] hospital follow-up appointment after discharge.   .Services Needed at time of discharge: Y = Yes, Blank = No PT:   OT:   RN:   Equipment:   Other:     LOS: 2 days   Ivin Poot, MD 02/01/2013, 7:53 AM

## 2013-02-01 NOTE — Progress Notes (Signed)
Occupational Therapy Evaluation Patient Details Name: [name] MRN: [id number] DOB: Jul 29, 1945 Today's Date: 02/01/2013 Time: [id number] OT Time Calculation (min): 34 min  OT Assessment / Plan / Recommendation History of present illness Patient is 68 yo man with PMH of CAD s/p MI 06/22/12, HTN, HLD, DM-II (A1c 8.0 on 06/24/12), eczema, medication noncompliance, who presents with polyuria, weakness, fever and chest pain.    Clinical Impression   Patient's d/c plan is SNF.    OT Assessment  Patient needs continued OT Services    Follow Up Recommendations  SNF    Barriers to Discharge Decreased caregiver support    Equipment Recommendations  Other (comment) (tbd at next venue)    Recommendations for Other Services    Frequency  Min 2X/week    Precautions / Restrictions Precautions Precautions: Fall Restrictions Weight Bearing Restrictions: No   Pertinent Vitals/Pain No c/o pain    ADL  Eating/Feeding: Simulated;Independent Where Assessed - Eating/Feeding: Bed level Grooming: Simulated;Set up Where Assessed - Grooming: Supine, head of bed up Lower Body Bathing: Simulated;+1 Total assistance Where Assessed - Lower Body Bathing: Supported standing Lower Body Dressing: Simulated;+1 Total assistance Where Assessed - Lower Body Dressing: Unsupported sitting Toilet Transfer: Simulated;Minimal assistance Toilet Transfer Method: Sit to stand Toilet Transfer Equipment: Comfort height toilet;Grab bars Equipment Used: Rolling walker Transfers/Ambulation Related to ADLs: Bed mobility S and increased time using rails and bed controls. Sit to stand min A and amb to/from door with RW and min A. Patient fatigues quickly. ADL Comments: SOB/fatigue with minimal activity. States he can no longer care for himself at home.    OT Diagnosis: Generalized weakness  OT Problem List: Decreased strength;Decreased range of motion;Decreased activity tolerance;Impaired balance (sitting and/or  standing);Decreased knowledge of use of DME or AE;Cardiopulmonary status limiting activity;Obesity OT Treatment Interventions: Self-care/ADL training;Therapeutic exercise;Energy conservation;DME and/or AE instruction;Therapeutic activities;Patient/family education   OT Goals(Current goals can be found in the care plan section) Acute Rehab OT Goals Patient Stated Goal: to get stronger and improve mobility OT Goal Formulation: With patient Time For Goal Achievement: 02/15/13 Potential to Achieve Goals: Good  Visit Information  Last OT Received On: 02/01/13 Assistance Needed: +2 History of Present Illness: Patient is 68 yo man with PMH of CAD s/p MI 06/22/12, HTN, HLD, DM-II (A1c 8.0 on 06/24/12), eczema, medication noncompliance, who presents with polyuria, weakness, fever and chest pain.        Prior Functioning     Home Living Family/patient expects to be discharged to:: Skilled nursing facility Prior Function Level of Independence: Independent with assistive device(s) Comments: used crutches in the house and walker outside Communication Communication: No difficulties Dominant Hand: Right         Vision/Perception Vision - History Baseline Vision: Wears glasses all the time   Cognition  Cognition Arousal/Alertness: Awake/alert Behavior During Therapy: WFL for tasks assessed/performed Overall Cognitive Status: Within Functional Limits for tasks assessed    Extremity/Trunk Assessment Upper Extremity Assessment Upper Extremity Assessment: Generalized weakness Lower Extremity Assessment Lower Extremity Assessment: Defer to PT evaluation     Mobility       Exercise     Balance     End of Session OT - End of Session Equipment Utilized During Treatment: Rolling walker Activity Tolerance: Patient tolerated treatment well Patient left: in bed;with call bell/phone within reach Nurse Communication: Mobility status  GO     [name] A 02/01/2013, 12:52 PM

## 2013-02-01 NOTE — Discharge Summary (Addendum)
Name: [name] MRN: [id number] DOB: January 08, 1946 68 y.o. PCP: Tracie Harrier, MD  Date of Admission: 01/30/2013  6:31 AM Date of Discharge: 02/02/2013 Attending Physician: Dominic Pea, DO  ADDENDUM: Decided to hold discharge yesterday for 24 hour observation after transitioning from IV to PO antibiotics given patient's medical co-morbidities.   Discharge Diagnosis: Principal Problem:   Sepsis Active Problems:   Coronary artery disease   Hypertension   Hyperlipidemia   Morbid obesity   Chest pain   DM (diabetes mellitus), type 2   Cellulitis of abdominal wall   History of medication noncompliance  Discharge Medications:   Medication List         acetaminophen 325 MG tablet  Commonly known as:  TYLENOL  Take 2 tablets (650 mg total) by mouth every 4 (four) hours as needed for mild pain or fever.     aspirin 81 MG chewable tablet  Chew 1 tablet (81 mg total) by mouth daily.     carvedilol 3.125 MG tablet  Commonly known as:  COREG  Take 1 tablet (3.125 mg total) by mouth 2 (two) times daily with a meal.     doxycycline 100 MG tablet  Commonly known as:  VIBRA-TABS  Take 1 tablet (100 mg total) by mouth 2 (two) times daily.     insulin aspart 100 UNIT/ML injection  Commonly known as:  NOVOLOG  Inject 5 Units into the skin 3 (three) times daily with meals.     insulin glargine 100 UNIT/ML injection  Commonly known as:  LANTUS  Inject 0.25 mLs (25 Units total) into the skin daily.     nitroGLYCERIN 0.4 MG SL tablet  Commonly known as:  NITROSTAT  Place 1 tablet (0.4 mg total) under the tongue every 5 (five) minutes as needed for chest pain (up to 3 doses).     pantoprazole 40 MG tablet  Commonly known as:  PROTONIX  Take 1 tablet (40 mg total) by mouth daily.     prasugrel 10 MG Tabs tablet  Commonly known as:  EFFIENT  Take 1 tablet (10 mg total) by mouth daily.     simvastatin 20 MG tablet  Commonly known as:  ZOCOR  Take 1 tablet (20 mg total) by  mouth daily at 6 PM.        Disposition and follow-up:   Mr.[name] was discharged from [facility] in Stable condition.  At the hospital follow up visit please address:  1.  Resolution of abdominal cellulitis  2.  BG control on current insulin regimen   3.  Any further chest pain on PPI  4.  Any statin side effects  5.  Labs / imaging needed at time of follow-up: none  6.  Pending labs/ test needing follow-up: none  Follow-up Appointments: Follow-up Information   Follow up with Marinda Elk, MD On 02/08/2013. (1:30pm)    Specialty:  Physician Assistant   Contact information:   [place] [address] [phone]       Discharge Instructions:   Consultations: cardiology   Procedures Performed:  Dg Chest 1 View  01/30/2013   CLINICAL DATA:  History of fall.  Injury.  Shortness of breath.  EXAM: CHEST - 1 VIEW  COMPARISON:  Chest x-ray 07/26/2012.  FINDINGS: Lung volumes are low. No consolidative airspace disease. No pleural effusions. No pneumothorax. Mild crowding of the pulmonary vasculature, accentuated by low lung volumes, without frank pulmonary edema. Heart size  is normal. Mediastinal contours are unremarkable. Bony thorax appears grossly intact.  IMPRESSION: 1. Low lung volumes without radiographic evidence of acute cardiopulmonary disease.   Electronically Signed   By: Vinnie Langton M.D.   On: 01/30/2013 07:51   Dg Hip Complete Left  01/30/2013   CLINICAL DATA:  History of fall complaining of pain in the left hip.  EXAM: LEFT HIP - COMPLETE 2+ VIEW  COMPARISON:  No priors.  FINDINGS: AP view of the pelvis and AP and lateral views of the left hip demonstrate no definite acute displaced fractures of the bony pelvic ring. Bilateral proximal femurs as visualized appear intact, and the left femoral head is located. Mild degenerative changes of osteoarthritis are noted in the hip joints bilaterally.  IMPRESSION: 1. No acute  radiographic abnormality of the bony pelvis or the left hip.   Electronically Signed   By: Vinnie Langton M.D.   On: 01/30/2013 07:49   Ct Head Wo Contrast  01/30/2013   CLINICAL DATA:  History of fall with injury to the left side of the head. Head pain.  EXAM: CT HEAD WITHOUT CONTRAST  TECHNIQUE: Contiguous axial images were obtained from the base of the skull through the vertex without intravenous contrast.  COMPARISON:  No priors.  FINDINGS: Mild patchy and confluent areas of decreased attenuation are noted throughout the deep and periventricular white matter of the cerebral hemispheres bilaterally, compatible with mild chronic microvascular ischemic disease. No acute displaced skull fractures are identified. No acute intracranial abnormality. Specifically, no evidence of acute post-traumatic intracranial hemorrhage, no definite regions of acute/subacute cerebral ischemia, no focal mass, mass effect, hydrocephalus or abnormal intra or extra-axial fluid collections. The visualized paranasal sinuses and mastoids are well pneumatized.  IMPRESSION: 1. No acute displaced skull fractures or findings to suggest significant acute traumatic injury to the brain. 2. Mild chronic microvascular ischemic changes of the cerebral white matter.   Electronically Signed   By: Vinnie Langton M.D.   On: 01/30/2013 07:57    Admission HPI:  Patient is 68 yo man with PMH of CAD s/p MI 06/22/12, HTN, HLD, DM-II (A1c 8.0 on 06/24/12), eczema, medication noncompliance, who presents with polyuria, weakness, fever and chest pain.  At baseline, patient lives alone at home. He uses walker and crutche to walk around during the daytime. Patient reports that he has not been taking his insulin in the past several months because he cannot afford it. He has increased urinary frequency, but without dysuria. He feels very tired recently, which has been progressively getting worse in the past several weeks. He also has intermittent chest pain  in the past several months. It is mild and nonradiating. It happens almost every other days, each time it lasts for about several minutes. It usually resolves after having a rest and taking nitroglycerin. Patient had an another episode of chest pain last night, which made him very tired. I got up out of bed to get a glass of water, but slipped, fell on the ground, hit his head and left hip. He is not very sure whether he lost consciousness, but did not have urinary incontinence and did not bite his tongue. He called EMS by himslef. EMS gave him 2 sublingual nitroglycerin with complete relief of his chest pain. Patient was on the floor by EMS with multiple bottles of urine and while the trash and feces are around him. He feels depressed, but answers yes only to two out of 8 SIG-ECAPS questions (loss of interest  in the daily activity and lack of energy). He initially complained of nausea and SOB to ED physician. He received IV Zofran in ED. He denies these symptoms when I evaluated him in ED. He was found to have temperature of 103.4, WBC 15.7 and no bone fracture by x-ray for left hip in ED.    Hospital Course by problem list: 1. Sepsis, resolved secondary to abdominal wall cellulitis: On admission, patient met 3/4 SIRS criteria (fever, leukocytosis and tachycardia) with source most likely abdominal cellulitis under large pannus.  WBC 15.7 --> 13.6 --> 8.8 on day of discharge. UA, UDS negative.  On admission, erythematous moist rash under large abdominal pannus with malodorous purulent drainage and surrounding cellulitis; cellulitis was much improved by day of discharge with no further drainage seen.  Patient was covered with wide spectrum IV antibiotics (vancomycin, zosyn) for 48 hours given his uncontrolled diabetes, converted to doxycycline 100 mg PO BID on day of discharge for total of 10 day antibiotic course (through 1/14).  Blood cultures x 2 with NGTD.  Wound care also evaluated patient and ordered  silver-impregnated fabric which is antimicrobial and wicks away moisture.  PT recommended discharge to SNF which patient was amenable to and social work arranged.    2. Chest pain (resolved) with history of CAD: Patient reported intermittent left sided chest pain for past month associated with shortness of breath and relieved with NTG.  Cardiology was consulted given his cardiac history of CAD with MI s/p STENT and concern for angina, but pain was relieved with GI cocktail in meantime, and cardiology recommended no further cardiac workup after seeing him.  EKG unchanged, troponins x 3 negative.  Pain most likely due to GERD thus PPI initiated per below; patient remained chest pain free day of discharge.  Continued home ASA (changed to 81 mg daily at discharge) and Effient while inpatient and at discharge.  Initiated Coreg 3.125 mg BID on admission and continued at discharge.   3. GERD- likely etiology of chest pain, see above.  Started Protonix 40 mg daily.  4. DM2: A1C 11%. Patient reports non-compliance with insulin for past several months due to financial constraints with recent polyuria and fatigue. Total daily dose of insulin 41 units day prior to discharge.  Patient was initially started on Lantus 10 units daily which was increased to 25 units daily along with Novolog 5 units TID AC on day of discharge.  PCP follow-up arranged to uptitrate insulin as needed.    5. CHF: Echo in 05/2012 showed EF of 45%. Patient has been non-compliant with Lasix at home (though likely self-diuresing due to hyperglycemia).  CXR negative. pBNP was elevated at 3113 (1937 in 06/2012) but clinically euvolemic or even slightly dry.  Gave NS 100 cc x 5 hours day prior to discharge.  Lasix was not initiated in setting of sepsis and since not warranted clincially, can restart as outpatient if necessary.    6. HLD: LDL 79. Patient not on statin therapy prior to admission but should be given medical comorbidities.  Simvistatin 20  mg daily initiated while inpatient and continued at discharge.    Discharge Vitals:   BP 141/57  Pulse 74  Temp(Src) 97.7 F (36.5 C) (Oral)  Resp 18  Ht _0  (1.803 m)  Wt 424 lb 8 oz (192.552 kg)  BMI 59.23 kg/m2  SpO2 95%  Discharge Labs:  Results for orders placed during the hospital encounter of 01/30/13 (from the past 24 hour(s))  GLUCOSE, CAPILLARY  Status: Abnormal   Collection Time    02/01/13 11:22 AM      Result Value Range   Glucose-Capillary 279 (*) 70 - 99 mg/dL   Comment 1 Notify RN    GLUCOSE, CAPILLARY     Status: Abnormal   Collection Time    02/01/13  4:23 PM      Result Value Range   Glucose-Capillary 238 (*) 70 - 99 mg/dL   Comment 1 Notify RN    GLUCOSE, CAPILLARY     Status: Abnormal   Collection Time    02/01/13  9:17 PM      Result Value Range   Glucose-Capillary 265 (*) 70 - 99 mg/dL   Comment 1 Notify RN     Comment 2 Documented in Chart    GLUCOSE, CAPILLARY     Status: Abnormal   Collection Time    02/02/13  6:08 AM      Result Value Range   Glucose-Capillary 214 (*) 70 - 99 mg/dL   Comment 1 Notify RN     Comment 2 Documented in Chart      Signed: Ivin Poot, MD 02/02/2013, 9:06 AM   Time Spent on Discharge: 40 minutes Services Ordered on Discharge: none Equipment Ordered on Discharge: none

## 2013-02-01 NOTE — Progress Notes (Signed)
Patient complained of headache and neck pain and lower back pain around 1am. Notified on-call MD for patient had requested flexeril for he had taken some time ago for back pain and stated it would help discomfort. Tylenol was given prior to calling the doctor. Doctor gave orders to see how patient does with tylenol for flexeril not on [facility] for medications that patient had been taking at home. Advised for patient to notify attending doctor in the morning. Patient requested tylenol once more now for headache. VSS and is afebrile. Tylenol given per PRN order. Will continue to monitor patient to end of shift.

## 2013-02-01 NOTE — Progress Notes (Signed)
Report given to receiving RN. Patient is stable with no complaints and no signs or symptoms of distress. 

## 2013-02-01 NOTE — Discharge Summary (Signed)
  Date: 02/01/2013  Patient name: [name]  Medical record number: [id number]  Date of birth: 05-Apr-1945   This patient has been seen and the plan of care was discussed with the house staff. Please see their note for complete details. I concur with their findings with the following additions/corrections: Improved today. Agree with 10 days of doxy 100 mg PO bid. BC NGTD. Abdominal wall cellulitis improved. CP is improved and likely due to GERD. Will need outpatient adjustment of insulin. PCP follow up needed. Agree with SNF placement and patient is agreeable. Medically stable for D/C.  Jonah BlueAlejandro Sabastien Tyler, DO, FACP [facility] 02/01/2013, 1:23 PM

## 2013-02-01 NOTE — Progress Notes (Addendum)
SATURATION QUALIFICATIONS: (This note is used to comply with regulatory documentation for home oxygen)  Patient Saturations on Room Air at Rest = 96%  Patient Saturations on Room Air while Ambulating = 97-98%

## 2013-02-02 DIAGNOSIS — L0291 Cutaneous abscess, unspecified: Secondary | ICD-10-CM

## 2013-02-02 DIAGNOSIS — L0390 Cellulitis, unspecified: Secondary | ICD-10-CM

## 2013-02-02 LAB — GLUCOSE, CAPILLARY
GLUCOSE-CAPILLARY: 265 mg/dL — AB (ref 70–99)
GLUCOSE-CAPILLARY: 214 mg/dL — AB (ref 70–99)
GLUCOSE-CAPILLARY: 214 mg/dL — AB (ref 70–99)

## 2013-02-02 MED ORDER — CARVEDILOL 6.25 MG PO TABS
6.2500 mg | ORAL_TABLET | Freq: Two times a day (BID) | ORAL | Status: DC
  Filled 2013-02-02 (×2): qty 1

## 2013-02-02 NOTE — Progress Notes (Signed)
Inpatient Diabetes Program Recommendations  AACE/ADA: New Consensus Statement on Inpatient Glycemic Control (2013)  Target Ranges:  Prepandial:   less than 140 mg/dL      Peak postprandial:   less than 180 mg/dL (1-2 hours)      Critically ill patients:  140 - 180 mg/dL   Reason for Visit: Consult. Late entry:  Diabetes Coordinator met with patient 1/7 to discuss diabetes regimen at home.  Patient reports he takes NPH 50 units BID and Glucovance (dose unknown).   Patient reports that he used to weigh 600 lbs.  Our discussion included weight, depression, self-care and home environment.  Patient is concerned that he can not manage his home without some help.  He does not want to lose his home.  He has a cat named "Smokey" that he is worried about.  He reports that his son is going to take care of the cat while he is away.  He has a daughter in another state and two grandchildren.  This coordinator encouraged patient to work hard and get better so that he can get home soon.  Patient was appreciative of visit and conversation.   Thank you  Raoul Pitch BSN, RN,CDE Inpatient Diabetes Coordinator [phone] (team pager)

## 2013-02-02 NOTE — Progress Notes (Signed)
Subjective: [name] is doing well this morning, no complaints, ready to go to SNF.   Decided to hold discharge yesterday for 24 hour observation after transitioning from IV to PO antibiotics given patient's medical co-morbidities.   Objective: Vital signs in last 24 hours: Filed Vitals:   02/01/13 1431 02/01/13 1657 02/01/13 2100 02/02/13 0559  BP: 135/65 143/63 135/61 141/57  Pulse: 74 75 76 74  Temp: 97.6 F (36.4 C)  97.3 F (36.3 C) 97.7 F (36.5 C)  TempSrc: Oral  Oral Oral  Resp: $Remo'20  18 18  'fweLN$ Height:      Weight:    424 lb 8 oz (192.552 kg)  SpO2: 95%  97% 95%   Weight change:   Intake/Output Summary (Last 24 hours) at 02/02/13 0755 Last data filed at 02/01/13 2100  Gross per 24 hour  Intake   1140 ml  Output   1200 ml  Net    -60 ml   PEX Exam limited 2/2 body habitus.  General: alert, cooperative, and NAD; morbidly obese HEENT: NCAT, no scleral icterus  Neck: supple, no lymphadenopathy Lungs: clear to ascultation bilaterally, normal work of respiration, no wheezes, rales, ronchi Heart: regular rate and rhythm, no murmurs, gallops, or rubs Abdomen: soft, non-tender, non-distended, normal bowel sounds Extremities: 2+ DP/PT pulses bilaterally, no cyanosis, clubbing, or edema Neurologic: alert & oriented X3, cranial nerves II-XII intact, strength grossly intact, sensation intact to light touch Skin: erythematous moist rash consistent with intertrigo under large abdominal pannus (L > R) but much improved, no discharge seen today; BLE venous stasis  Lab Results: Basic Metabolic Panel:  Recent Labs Lab 01/30/13 0830 02/01/13 0325  NA 133* 137  K 4.7 3.7  CL 94* 100  CO2 28 27  GLUCOSE 364* 258*  BUN 15 16  CREATININE 0.83 0.75  CALCIUM 8.9 8.2*   CBC:  Recent Labs Lab 01/31/13 0245 02/01/13 0325  WBC 13.6* 8.8  HGB 14.0 13.0  HCT 42.4 41.2  MCV 79.5 80.2  PLT 161 159   Cardiac Enzymes:  Recent Labs Lab 01/30/13 1632 01/30/13 2200  01/31/13 0245  TROPONINI <0.30 <0.30 <0.30   BNP:  Recent Labs Lab 01/30/13 0835  PROBNP 3113.0*   CBG:  Recent Labs Lab 01/31/13 1652 01/31/13 2136 02/01/13 0700 02/01/13 1122 02/01/13 1623 02/01/13 2117  GLUCAP 262* 241* 232* 279* 238* 265*   Hemoglobin A1C:  Recent Labs Lab 01/30/13 1632  HGBA1C 11.0*   Coagulation:  Recent Labs Lab 01/30/13 1632  LABPROT 13.1  INR 1.01   Urine Drug Screen: Drugs of Abuse     Component Value Date/Time   LABOPIA NONE DETECTED 01/30/2013 1011   COCAINSCRNUR NONE DETECTED 01/30/2013 1011   LABBENZ NONE DETECTED 01/30/2013 1011   AMPHETMU NONE DETECTED 01/30/2013 1011   THCU NONE DETECTED 01/30/2013 1011   LABBARB NONE DETECTED 01/30/2013 1011    Alcohol Level:  Recent Labs Lab 01/30/13 1632  ETH <11   Urinalysis:  Recent Labs Lab 01/30/13 1011  COLORURINE YELLOW  LABSPEC 1.038*  PHURINE 5.0  GLUCOSEU >1000*  HGBUR NEGATIVE  BILIRUBINUR NEGATIVE  KETONESUR 40*  PROTEINUR NEGATIVE  UROBILINOGEN 0.2  NITRITE NEGATIVE  LEUKOCYTESUR NEGATIVE    Micro Results: Recent Results (from the past 240 hour(s))  URINE CULTURE     Status: None   Collection Time    01/30/13 10:11 AM      Result Value Range Status   Specimen Description URINE, RANDOM   Final  Special Requests ADDED [id number]   Final   Culture  Setup Time     Final   Value: 01/30/2013 23:37     Performed at [facility]     Final   Value: NO GROWTH     Performed at [facility]   Culture     Final   Value: NO GROWTH     Performed at [facility]   Report Status 02/01/2013 FINAL   Final  CULTURE, BLOOD (ROUTINE X 2)     Status: None   Collection Time    01/30/13 10:35 AM      Result Value Range Status   Specimen Description BLOOD RIGHT ANTECUBITAL   Final   Special Requests BOTTLES DRAWN AEROBIC ONLY 10ML   Final   Culture  Setup Time     Final   Value: 01/30/2013 14:54     Performed at [facility]    Culture     Final   Value:        BLOOD CULTURE RECEIVED NO GROWTH TO DATE CULTURE WILL BE HELD FOR 5 DAYS BEFORE ISSUING A FINAL NEGATIVE REPORT     Performed at [facility]   Report Status PENDING   Incomplete  CULTURE, BLOOD (ROUTINE X 2)     Status: None   Collection Time    01/30/13 10:55 AM      Result Value Range Status   Specimen Description BLOOD LEFT ANTECUBITAL   Final   Special Requests BOTTLES DRAWN AEROBIC ONLY 4ML   Final   Culture  Setup Time     Final   Value: 01/30/2013 14:54     Performed at [facility]   Culture     Final   Value:        BLOOD CULTURE RECEIVED NO GROWTH TO DATE CULTURE WILL BE HELD FOR 5 DAYS BEFORE ISSUING A FINAL NEGATIVE REPORT     Performed at [facility]   Report Status PENDING   Incomplete   Medications: I have reviewed the patient's current medications. Scheduled Meds: . aspirin  324 mg Oral Daily  . carvedilol  3.125 mg Oral BID WC  . doxycycline  100 mg Oral Q12H  . heparin  5,000 Units Subcutaneous Q8H  . insulin aspart  0-15 Units Subcutaneous TID WC & HS  . insulin glargine  25 Units Subcutaneous Daily  . pantoprazole  40 mg Oral Daily  . prasugrel  10 mg Oral Daily  . simvastatin  20 mg Oral q1800  . sodium chloride  3 mL Intravenous Q12H  . sodium chloride  3 mL Intravenous Q12H  :   PRN Meds:.sodium chloride, acetaminophen, gi cocktail, nitroGLYCERIN, ondansetron (ZOFRAN) IV, sodium chloride  Assessment/Plan: #Sepsis secondary to abdominal wall cellulitis: On admission, patient met 3/4 SIRS criteria (fever, leukocytosis and tachycardia) with source most likely abdominal cellulitis under large pannus. WBC 15.7 --> 13.6 --> 8.8.  UA, UDS negative.  -continue doxycycline 100 mg PO BID, total 10 day abx course (today is day 4) -blood cultures x 2 NGTD -PT- recommending SNF which patient is amenable to  -discharge today  # Chest pain (resolved) with history of CAD:  Most likely due to GERD as pain  resolved with GI cocktail. Patient has history of CAD and MI s/p stent.  EKG unchanged, troponins x 3 negative, pBNP 3113 (1937 three months ago).   -cardiology consult, appreciate recs- no further cardiac  follow-up  -continue home ASA and Effient  -continue Coreg 3.125 mg BID  #GERD- see above -continue Protonix 40 mg daily -continue GI cocktail BID prn   # DM2: A1C 11%. Non-compliance with insulin for past several months and reporting including polyuria and fatigue.  CBGs in 200s.  -continue Lantus 25 units qhs  -SSI-moderate while inpatient; will prescribe prandial insulin 5 units TID AC at discharge -close PCP follow-up arranged for up-titration of insulin as needed   # CHF: Echo in 05/2012 showed EF of 45%. Non-compliance with Lasix at home, but patient does not appear volume overloaded on exam.  CXR negative. pBNP elevated but clinically euvolemic or even slightly dry.  # HLD: LDL 86 in 05/2012 --> 79. Not currently on statin (but may have been taking previously) -continue simvistatin 20 mg daily   Dispo:  Anticipated discharge today.   The patient does have a current PCP (Tracie Harrier, MD) and does need an [facility] hospital follow-up appointment after discharge.   .Services Needed at time of discharge: Y = Yes, Blank = No PT:   OT:   RN:   Equipment:   Other:     LOS: 3 days   Ivin Poot, MD 02/02/2013, 7:55 AM

## 2013-02-02 NOTE — Progress Notes (Signed)
[facility].  Report called to pt's nurse Selinda OrionLatoyna, LPN.  Verbalized understanding.  Amanda PeaNellie Caycee Wanat, Charity fundraiserN.

## 2013-02-02 NOTE — Progress Notes (Signed)
UR completed Deandrae Wajda K. Dwana Garin, RN, BSN, MSHL, CCM  02/02/2013 11:49 AM

## 2013-02-02 NOTE — Discharge Summary (Signed)
  Date: 02/02/2013  Patient name: [name]  Medical record number: [id number]  Date of birth: 1945/05/12   This patient has been seen and the plan of care was discussed with the house staff. Please see their note for complete details. I concur with their findings with the following additions/corrections: Given initial presentation of Sepsis, he was monitored during transition from IV antibiotics to PO doxycycline. He has continued to improve. No BC evidence of bacteremia. He is agreeable today for D/C and transfer to SNF.  Jonah BlueAlejandro Javayah Magaw, DO, FACP [facility] 02/02/2013, 1:18 PM

## 2013-02-02 NOTE — Progress Notes (Signed)
Pt c/o of mid chest discomfort while ambulating with PT.  Subsided after PT got him back to bed.  Bp 144/62, P72.  D.r Aundria Rudogers paged to inform her.  Awaiting return call.  Will continue to monitor.  Amanda PeaNellie Jonay Hitchcock, Charity fundraiserN.

## 2013-02-02 NOTE — Progress Notes (Signed)
Physical Therapy Treatment Patient Details Name: [name] MRN: [id number] DOB: 04/05/1945 Today's Date: 02/02/2013 Time: 1022-1053 PT Time Calculation (min): 31 min  PT Assessment / Plan / Recommendation  History of Present Illness Patient is 68 yo man with PMH of CAD s/p MI 06/22/12, HTN, HLD, DM-II (A1c 8.0 on 06/24/12), eczema, medication noncompliance, who presents with polyuria, weakness, fever and chest pain.    PT Comments   Pt admitted with above. Pt currently with functional limitations due to balance and endurance deficits.  Pt will benefit from skilled PT to increase their independence and safety with mobility to allow discharge to the venue listed below.   Follow Up Recommendations  SNF;Supervision/Assistance - 24 hour                 Equipment Recommendations  None recommended by PT        Frequency Min 3X/week   Progress towards PT Goals Progress towards PT goals: Progressing toward goals  Plan Current plan remains appropriate    Precautions / Restrictions Precautions Precautions: Fall Restrictions Weight Bearing Restrictions: No   Pertinent Vitals/Pain VSS, chest pain with exertion, nursing came in and assessed pt.    Mobility  Bed Mobility Overal bed mobility: Needs Assistance Bed Mobility: Supine to Sit Supine to sit: Min assist General bed mobility comments: guidance for LEs and for scooting Transfers Overall transfer level: Needs assistance Equipment used: Rolling walker (2 wheeled) Transfers: Sit to/from Stand Sit to Stand: Min guard General transfer comment: cues for hand placement and safety Ambulation/Gait Ambulation/Gait assistance: Min assist;+2 safety/equipment Ambulation Distance (Feet): 110 Feet (40 feet and 60 feet and 10 feet) Assistive device: Rolling walker (2 wheeled) Gait Pattern/deviations: Step-through pattern;Decreased stride length;Trunk flexed;Wide base of support Gait velocity interpretation: Below normal speed for  age/gender General Gait Details: Pt initially with good technique. As pt fatigues, he leans on RW.  Pt not good at judging how far he can ambulate.  PT had to assist pt in knowing when he needed to rest.  Pt took 1 sitting rest break and then on 2nd walk, again needed to sit prior to gettting to bed.  C/o chest pain and [name], nurse came in and addressed chest pain with pt.  Per nurse, appears to be exertional.  Instructed pt to use energy conservation techniques and that 45 feet at a time may be all he can do initially until he can incr endurance with [facility].      Exercises General Exercises - Lower Extremity Ankle Circles/Pumps: AROM;Both;5 reps;Seated Long Arc Quad: AROM;Both;5 reps;Seated    PT Goals (current goals can now be found in the care plan section)    Visit Information  Last PT Received On: 02/02/13 Assistance Needed: +2 History of Present Illness: Patient is 68 yo man with PMH of CAD s/p MI 06/22/12, HTN, HLD, DM-II (A1c 8.0 on 06/24/12), eczema, medication noncompliance, who presents with polyuria, weakness, fever and chest pain.     Subjective Data  Subjective: "I just hurt when I do much."   Cognition  Cognition Arousal/Alertness: Awake/alert Behavior During Therapy: WFL for tasks assessed/performed Overall Cognitive Status: Within Functional Limits for tasks assessed    Balance  Balance Overall balance assessment: History of Falls;Needs assistance Sitting-balance support: No upper extremity supported;Feet supported Sitting balance-Leahy Scale: Good Standing balance support: Bilateral upper extremity supported;During functional activity Standing balance-Leahy Scale: Fair Standing balance comment: Needs RW for balance.  Cannot stand without UE assist.   End of  Session PT - End of Session Equipment Utilized During Treatment: Gait belt Activity Tolerance: Patient tolerated treatment well;Patient limited by fatigue Patient left: in bed;with call bell/phone  within reach;with nursing/sitter in room Nurse Communication: Mobility status        [name] 02/02/2013, 2:36 PM  [facility] [phone] (pager)

## 2013-02-02 NOTE — Progress Notes (Signed)
Patient Name: [name] Date of Encounter: 02/02/2013     Principal Problem:   Sepsis Active Problems:   Coronary artery disease   Hypertension   Hyperlipidemia   Morbid obesity   Chest pain   DM (diabetes mellitus), type 2   Cellulitis of abdominal wall   History of medication noncompliance    SUBJECTIVE The patient feels well at the present time and is looking forward to transfer to the skilled nursing facility later today.  The patient did have some chest discomfort when ambulating in the hall earlier today.  This was resolved with rest.  The patient states that this is not uncommon for him particularly if he has been at prolonged bed rest for several days.  The patient anticipates that this will improve with additional conditioning at the skilled nursing facility.  CURRENT MEDS . aspirin  324 mg Oral Daily  . carvedilol  3.125 mg Oral BID WC  . doxycycline  100 mg Oral Q12H  . heparin  5,000 Units Subcutaneous Q8H  . insulin aspart  0-15 Units Subcutaneous TID WC & HS  . insulin glargine  25 Units Subcutaneous Daily  . pantoprazole  40 mg Oral Daily  . prasugrel  10 mg Oral Daily  . simvastatin  20 mg Oral q1800  . sodium chloride  3 mL Intravenous Q12H  . sodium chloride  3 mL Intravenous Q12H    OBJECTIVE  Filed Vitals:   02/01/13 1657 02/01/13 2100 02/02/13 0559 02/02/13 1050  BP: 143/63 135/61 141/57 144/68  Pulse: 75 76 74 72  Temp:  97.3 F (36.3 C) 97.7 F (36.5 C)   TempSrc:  Oral Oral   Resp:  18 18   Height:      Weight:   424 lb 8 oz (192.552 kg)   SpO2:  97% 95%     Intake/Output Summary (Last 24 hours) at 02/02/13 1135 Last data filed at 02/02/13 1000  Gross per 24 hour  Intake   1140 ml  Output    702 ml  Net    438 ml   Filed Weights   01/31/13 0558 02/01/13 0701 02/02/13 0559  Weight: 495 lb 11.2 oz (224.848 kg) 423 lb 1 oz (191.9 kg) 424 lb 8 oz (192.552 kg)    PHYSICAL EXAM  General: Pleasant, NAD. Morbidly obese.  In no  distress.  Psych: Normal affect.  Neuro: Alert and oriented X 3. Moves all extremities spontaneously.  HEENT: Normal  Neck: Supple without bruits or JVD.  Lungs: Resp regular and unlabored, CTA. Shallow inspiratory effort secondary to obesity  Heart: RRR no s3, s4, or murmurs.  Abdomen: Soft, non-tender, non-distended, BS + x 4. Cellulitis below panus  Extremities: There is chronic pretibial and pedal edema.    Accessory Clinical Findings  CBC  Recent Labs  01/31/13 0245 02/01/13 0325  WBC 13.6* 8.8  HGB 14.0 13.0  HCT 42.4 41.2  MCV 79.5 80.2  PLT 161 159   Basic Metabolic Panel  Recent Labs  02/01/13 0325  NA 137  K 3.7  CL 100  CO2 27  GLUCOSE 258*  BUN 16  CREATININE 0.75  CALCIUM 8.2*   Liver Function Tests No results found for this basename: AST, ALT, ALKPHOS, BILITOT, PROT, ALBUMIN,  in the last 72 hours No results found for this basename: LIPASE, AMYLASE,  in the last 72 hours Cardiac Enzymes  Recent Labs  01/30/13 1632 01/30/13 2200 01/31/13 0245  TROPONINI <0.30 <0.30 <0.30  BNP No components found with this basename: POCBNP,  D-Dimer No results found for this basename: DDIMER,  in the last 72 hours Hemoglobin A1C  Recent Labs  01/30/13 1632  HGBA1C 11.0*   Fasting Lipid Panel  Recent Labs  01/31/13 1150  CHOL 139  HDL 30*  LDLCALC 79  TRIG 098  CHOLHDL 4.6   Thyroid Function Tests No results found for this basename: TSH, T4TOTAL, FREET3, T3FREE, THYROIDAB,  in the last 72 hours  TELE  Normal sinus rhythm  ECG    Radiology/Studies  Dg Chest 1 View  01/30/2013   CLINICAL DATA:  History of fall.  Injury.  Shortness of breath.  EXAM: CHEST - 1 VIEW  COMPARISON:  Chest x-ray 07/26/2012.  FINDINGS: Lung volumes are low. No consolidative airspace disease. No pleural effusions. No pneumothorax. Mild crowding of the pulmonary vasculature, accentuated by low lung volumes, without frank pulmonary edema. Heart size is normal.  Mediastinal contours are unremarkable. Bony thorax appears grossly intact.  IMPRESSION: 1. Low lung volumes without radiographic evidence of acute cardiopulmonary disease.   Electronically Signed   By: Trudie Reed M.D.   On: 01/30/2013 07:51   Dg Hip Complete Left  01/30/2013   CLINICAL DATA:  History of fall complaining of pain in the left hip.  EXAM: LEFT HIP - COMPLETE 2+ VIEW  COMPARISON:  No priors.  FINDINGS: AP view of the pelvis and AP and lateral views of the left hip demonstrate no definite acute displaced fractures of the bony pelvic ring. Bilateral proximal femurs as visualized appear intact, and the left femoral head is located. Mild degenerative changes of osteoarthritis are noted in the hip joints bilaterally.  IMPRESSION: 1. No acute radiographic abnormality of the bony pelvis or the left hip.   Electronically Signed   By: Trudie Reed M.D.   On: 01/30/2013 07:49   Ct Head Wo Contrast  01/30/2013   CLINICAL DATA:  History of fall with injury to the left side of the head. Head pain.  EXAM: CT HEAD WITHOUT CONTRAST  TECHNIQUE: Contiguous axial images were obtained from the base of the skull through the vertex without intravenous contrast.  COMPARISON:  No priors.  FINDINGS: Mild patchy and confluent areas of decreased attenuation are noted throughout the deep and periventricular white matter of the cerebral hemispheres bilaterally, compatible with mild chronic microvascular ischemic disease. No acute displaced skull fractures are identified. No acute intracranial abnormality. Specifically, no evidence of acute post-traumatic intracranial hemorrhage, no definite regions of acute/subacute cerebral ischemia, no focal mass, mass effect, hydrocephalus or abnormal intra or extra-axial fluid collections. The visualized paranasal sinuses and mastoids are well pneumatized.  IMPRESSION: 1. No acute displaced skull fractures or findings to suggest significant acute traumatic injury to the brain. 2.  Mild chronic microvascular ischemic changes of the cerebral white matter.   Electronically Signed   By: Trudie Reed M.D.   On: 01/30/2013 07:57    ASSESSMENT AND PLAN 1. atypical chest pain. No evidence of infarction or NSTEMI this admission.  2. known coronary artery disease with previous anterior wall STEMI and with previous inferior wall, status post successful stenting.  3. morbid obesity  4. abdominal wall cellulitis 5. possible GERD  Plan: Continue dual antiplatelet therapy for his coronary artery disease.  I will increase his carvedilol to 6.25 mg twice a day to help with exertional tachycardia. The patient is stable for discharge to skilled nursing facility today.  Once out of skilled nursing facility he  will follow up with Dr.Arida in the office.  Signed, Cassell Clement MD

## 2013-02-05 LAB — CULTURE, BLOOD (ROUTINE X 2)
CULTURE: NO GROWTH
Culture: NO GROWTH

## 2013-05-02 ENCOUNTER — Telehealth: Payer: Self-pay

## 2013-05-02 NOTE — Telephone Encounter (Signed)
Pt called asking for samples of Eliquis, as he is having difficulty paying for his rx right now.  Left 1 box of Eliquis for pt to pick up at front desk. Asked pt to call next week to see if we have received more samples.

## 2013-05-11 ENCOUNTER — Other Ambulatory Visit: Payer: Self-pay | Admitting: *Deleted

## 2013-05-11 ENCOUNTER — Other Ambulatory Visit: Payer: Self-pay

## 2013-05-11 MED ORDER — PRASUGREL HCL 10 MG PO TABS: 10.0000 mg | ORAL_TABLET | Freq: Every day | ORAL | Status: AC

## 2013-05-11 MED ORDER — PRASUGREL HCL 10 MG PO TABS: 10.0000 mg | ORAL_TABLET | Freq: Every day | ORAL | Status: DC

## 2013-05-11 NOTE — Telephone Encounter (Signed)
Requested Prescriptions   Signed Prescriptions Disp Refills  . prasugrel (EFFIENT) 10 MG TABS tablet 30 tablet 1    Sig: Take 1 tablet (10 mg total) by mouth daily.    Authorizing Provider: Lorine BearsARIDA, MUHAMMAD A    Ordering User: Kendrick FriesLOPEZ, Katelynne Revak C

## 2013-05-22 ENCOUNTER — Ambulatory Visit: Payer: Medicare Other | Admitting: Cardiovascular Disease

## 2013-07-20 ENCOUNTER — Emergency Department (HOSPITAL_COMMUNITY): Payer: Medicare Other

## 2013-07-20 ENCOUNTER — Encounter (HOSPITAL_COMMUNITY): Payer: Self-pay | Admitting: Emergency Medicine

## 2013-07-20 ENCOUNTER — Inpatient Hospital Stay (HOSPITAL_COMMUNITY)
Admission: EM | Admit: 2013-07-20 | Discharge: 2013-07-23 | DRG: 292 | Disposition: A | Discharge status: Home / Self Care | Payer: Medicare Other | Attending: Internal Medicine | Admitting: Internal Medicine

## 2013-07-20 DIAGNOSIS — R0989 Other specified symptoms and signs involving the circulatory and respiratory systems: Secondary | ICD-10-CM | POA: Diagnosis present

## 2013-07-20 DIAGNOSIS — Z9119 Patient's noncompliance with other medical treatment and regimen: Secondary | ICD-10-CM

## 2013-07-20 DIAGNOSIS — F329 Major depressive disorder, single episode, unspecified: Secondary | ICD-10-CM | POA: Diagnosis present

## 2013-07-20 DIAGNOSIS — I2589 Other forms of chronic ischemic heart disease: Secondary | ICD-10-CM | POA: Diagnosis present

## 2013-07-20 DIAGNOSIS — G4730 Sleep apnea, unspecified: Secondary | ICD-10-CM

## 2013-07-20 DIAGNOSIS — IMO0001 Reserved for inherently not codable concepts without codable children: Secondary | ICD-10-CM

## 2013-07-20 DIAGNOSIS — L0390 Cellulitis, unspecified: Secondary | ICD-10-CM | POA: Diagnosis present

## 2013-07-20 DIAGNOSIS — M25569 Pain in unspecified knee: Secondary | ICD-10-CM | POA: Diagnosis present

## 2013-07-20 DIAGNOSIS — Z91148 Patient's other noncompliance with medication regimen for other reason: Secondary | ICD-10-CM

## 2013-07-20 DIAGNOSIS — I252 Old myocardial infarction: Secondary | ICD-10-CM

## 2013-07-20 DIAGNOSIS — E118 Type 2 diabetes mellitus with unspecified complications: Secondary | ICD-10-CM

## 2013-07-20 DIAGNOSIS — R5381 Other malaise: Secondary | ICD-10-CM

## 2013-07-20 DIAGNOSIS — E785 Hyperlipidemia, unspecified: Secondary | ICD-10-CM

## 2013-07-20 DIAGNOSIS — I10 Essential (primary) hypertension: Secondary | ICD-10-CM | POA: Diagnosis present

## 2013-07-20 DIAGNOSIS — R0609 Other forms of dyspnea: Secondary | ICD-10-CM | POA: Diagnosis present

## 2013-07-20 DIAGNOSIS — R079 Chest pain, unspecified: Secondary | ICD-10-CM

## 2013-07-20 DIAGNOSIS — F3289 Other specified depressive episodes: Secondary | ICD-10-CM | POA: Diagnosis present

## 2013-07-20 DIAGNOSIS — G8929 Other chronic pain: Secondary | ICD-10-CM | POA: Diagnosis present

## 2013-07-20 DIAGNOSIS — L03119 Cellulitis of unspecified part of limb: Secondary | ICD-10-CM

## 2013-07-20 DIAGNOSIS — R5383 Other fatigue: Secondary | ICD-10-CM

## 2013-07-20 DIAGNOSIS — F411 Generalized anxiety disorder: Secondary | ICD-10-CM | POA: Diagnosis present

## 2013-07-20 DIAGNOSIS — I159 Secondary hypertension, unspecified: Secondary | ICD-10-CM

## 2013-07-20 DIAGNOSIS — I2119 ST elevation (STEMI) myocardial infarction involving other coronary artery of inferior wall: Secondary | ICD-10-CM

## 2013-07-20 DIAGNOSIS — R0600 Dyspnea, unspecified: Secondary | ICD-10-CM

## 2013-07-20 DIAGNOSIS — E119 Type 2 diabetes mellitus without complications: Secondary | ICD-10-CM | POA: Diagnosis present

## 2013-07-20 DIAGNOSIS — M1990 Unspecified osteoarthritis, unspecified site: Secondary | ICD-10-CM | POA: Diagnosis present

## 2013-07-20 DIAGNOSIS — R05 Cough: Secondary | ICD-10-CM

## 2013-07-20 DIAGNOSIS — Z6841 Body Mass Index (BMI) 40.0 and over, adult: Secondary | ICD-10-CM

## 2013-07-20 DIAGNOSIS — I509 Heart failure, unspecified: Secondary | ICD-10-CM

## 2013-07-20 DIAGNOSIS — I5042 Chronic combined systolic (congestive) and diastolic (congestive) heart failure: Principal | ICD-10-CM | POA: Diagnosis present

## 2013-07-20 DIAGNOSIS — Z8249 Family history of ischemic heart disease and other diseases of the circulatory system: Secondary | ICD-10-CM

## 2013-07-20 DIAGNOSIS — L03311 Cellulitis of abdominal wall: Secondary | ICD-10-CM

## 2013-07-20 DIAGNOSIS — I2510 Atherosclerotic heart disease of native coronary artery without angina pectoris: Secondary | ICD-10-CM | POA: Diagnosis present

## 2013-07-20 DIAGNOSIS — I200 Unstable angina: Secondary | ICD-10-CM

## 2013-07-20 DIAGNOSIS — L02419 Cutaneous abscess of limb, unspecified: Secondary | ICD-10-CM | POA: Diagnosis present

## 2013-07-20 DIAGNOSIS — L03116 Cellulitis of left lower limb: Secondary | ICD-10-CM

## 2013-07-20 DIAGNOSIS — E1165 Type 2 diabetes mellitus with hyperglycemia: Secondary | ICD-10-CM

## 2013-07-20 DIAGNOSIS — R0789 Other chest pain: Secondary | ICD-10-CM

## 2013-07-20 DIAGNOSIS — R059 Cough, unspecified: Secondary | ICD-10-CM

## 2013-07-20 DIAGNOSIS — Z9861 Coronary angioplasty status: Secondary | ICD-10-CM

## 2013-07-20 DIAGNOSIS — Z91199 Patient's noncompliance with other medical treatment and regimen due to unspecified reason: Secondary | ICD-10-CM

## 2013-07-20 DIAGNOSIS — I214 Non-ST elevation (NSTEMI) myocardial infarction: Secondary | ICD-10-CM

## 2013-07-20 DIAGNOSIS — Z794 Long term (current) use of insulin: Secondary | ICD-10-CM

## 2013-07-20 DIAGNOSIS — Z9114 Patient's other noncompliance with medication regimen: Secondary | ICD-10-CM

## 2013-07-20 DIAGNOSIS — I255 Ischemic cardiomyopathy: Secondary | ICD-10-CM

## 2013-07-20 HISTORY — DX: Angina pectoris, unspecified: I20.9

## 2013-07-20 HISTORY — DX: Type 2 diabetes mellitus without complications: E11.9

## 2013-07-20 HISTORY — DX: Pneumonia, unspecified organism: J18.9

## 2013-07-20 HISTORY — DX: ST elevation (STEMI) myocardial infarction involving other coronary artery of inferior wall: I21.19

## 2013-07-20 HISTORY — DX: Non-ST elevation (NSTEMI) myocardial infarction: I21.4

## 2013-07-20 LAB — BASIC METABOLIC PANEL
BUN: 13 mg/dL (ref 6–23)
CHLORIDE: 97 meq/L (ref 96–112)
CO2: 25 meq/L (ref 19–32)
Calcium: 8.9 mg/dL (ref 8.4–10.5)
Creatinine, Ser: 0.69 mg/dL (ref 0.50–1.35)
GFR calc Af Amer: >90 mL/min (ref >90)
GFR calc non Af Amer: >90 mL/min (ref >90)
Glucose, Bld: 290 mg/dL — ABNORMAL HIGH (ref 70–99)
POTASSIUM: 4.3 meq/L (ref 3.7–5.3)
SODIUM: 136 meq/L — AB (ref 137–147)

## 2013-07-20 LAB — GLUCOSE, CAPILLARY
GLUCOSE-CAPILLARY: 199 mg/dL — AB (ref 70–99)
GLUCOSE-CAPILLARY: 195 mg/dL — AB (ref 70–99)
Glucose-Capillary: 271 mg/dL — ABNORMAL HIGH (ref 70–99)
Glucose-Capillary: 237 mg/dL — ABNORMAL HIGH (ref 70–99)

## 2013-07-20 LAB — I-STAT TROPONIN, ED: TROPONIN I, POC: 0.01 ng/mL (ref 0.00–0.08)

## 2013-07-20 LAB — CBC
HCT: 40.9 % (ref 39.0–52.0)
Hemoglobin: 12.9 g/dL — ABNORMAL LOW (ref 13.0–17.0)
MCH: 25.6 pg — ABNORMAL LOW (ref 26.0–34.0)
MCHC: 31.5 g/dL (ref 30.0–36.0)
MCV: 81.3 fL (ref 78.0–100.0)
Platelets: 230 10*3/uL (ref 150–400)
RBC: 5.03 MIL/uL (ref 4.22–5.81)
RDW: 14.8 % (ref 11.5–15.5)
WBC: 20.6 10*3/uL — AB (ref 4.0–10.5)

## 2013-07-20 LAB — D-DIMER, QUANTITATIVE (NOT AT ARMC): D DIMER QUANT: 0.55 ug{FEU}/mL — AB (ref 0.00–0.48)

## 2013-07-20 LAB — PRO B NATRIURETIC PEPTIDE: PRO B NATRI PEPTIDE: 1810 pg/mL — AB (ref 0–125)

## 2013-07-20 LAB — TROPONIN I: Troponin I: <0.3 ng/mL (ref <0.30)

## 2013-07-20 LAB — TSH: TSH: 1.85 u[IU]/mL (ref 0.350–4.500)

## 2013-07-20 LAB — HEMOGLOBIN A1C
Hgb A1c MFr Bld: 9.5 % — ABNORMAL HIGH (ref <5.7)
MEAN PLASMA GLUCOSE: 226 mg/dL — AB (ref <117)

## 2013-07-20 MED ORDER — INSULIN ASPART 100 UNIT/ML ~~LOC~~ SOLN
0.0000 [IU] | Freq: Three times a day (TID) | SUBCUTANEOUS | Status: DC
  Administered 2013-07-20: 3 [IU] via SUBCUTANEOUS
  Administered 2013-07-20: 5 [IU] via SUBCUTANEOUS
  Administered 2013-07-21: 3 [IU] via SUBCUTANEOUS
  Administered 2013-07-21: 8 [IU] via SUBCUTANEOUS
  Administered 2013-07-21: 5 [IU] via SUBCUTANEOUS
  Administered 2013-07-22: 3 [IU] via SUBCUTANEOUS
  Administered 2013-07-22 (×2): 8 [IU] via SUBCUTANEOUS
  Administered 2013-07-23: 5 [IU] via SUBCUTANEOUS

## 2013-07-20 MED ORDER — ATORVASTATIN CALCIUM 80 MG PO TABS
80.0000 mg | ORAL_TABLET | Freq: Every day | ORAL | Status: DC
  Administered 2013-07-20 – 2013-07-22 (×3): 80 mg via ORAL
  Filled 2013-07-20 (×4): qty 1

## 2013-07-20 MED ORDER — METOPROLOL TARTRATE 12.5 MG HALF TABLET
12.5000 mg | ORAL_TABLET | Freq: Two times a day (BID) | ORAL | Status: DC
  Administered 2013-07-20 – 2013-07-22 (×6): 12.5 mg via ORAL
  Filled 2013-07-20 (×8): qty 1

## 2013-07-20 MED ORDER — CLINDAMYCIN PHOSPHATE 300 MG/50ML IV SOLN
300.0000 mg | Freq: Three times a day (TID) | INTRAVENOUS | Status: DC
  Administered 2013-07-20 – 2013-07-23 (×9): 300 mg via INTRAVENOUS
  Filled 2013-07-20 (×11): qty 50

## 2013-07-20 MED ORDER — ACETAMINOPHEN 325 MG PO TABS: 650.0000 mg | ORAL_TABLET | ORAL | Status: DC | PRN

## 2013-07-20 MED ORDER — HYDROCODONE-ACETAMINOPHEN 5-325 MG PO TABS
1.0000 | ORAL_TABLET | ORAL | Status: DC | PRN
  Administered 2013-07-21: 1 via ORAL
  Filled 2013-07-20: qty 2

## 2013-07-20 MED ORDER — HEPARIN SODIUM (PORCINE) 5000 UNIT/ML IJ SOLN
5000.0000 [IU] | Freq: Three times a day (TID) | INTRAMUSCULAR | Status: DC
  Administered 2013-07-20 – 2013-07-22 (×8): 5000 [IU] via SUBCUTANEOUS
  Filled 2013-07-20 (×12): qty 1

## 2013-07-20 MED ORDER — FUROSEMIDE 10 MG/ML IJ SOLN
60.0000 mg | Freq: Two times a day (BID) | INTRAMUSCULAR | Status: DC
  Administered 2013-07-20: 60 mg via INTRAVENOUS
  Filled 2013-07-20 (×3): qty 6

## 2013-07-20 MED ORDER — ONDANSETRON HCL 4 MG PO TABS: 4.0000 mg | ORAL_TABLET | Freq: Four times a day (QID) | ORAL | Status: DC | PRN

## 2013-07-20 MED ORDER — BETAMETHASONE VALERATE 0.1 % EX LOTN
TOPICAL_LOTION | Freq: Two times a day (BID) | CUTANEOUS | Status: DC
Start: 2013-07-20 — End: 2013-07-21
  Filled 2013-07-20 (×2): qty 60

## 2013-07-20 MED ORDER — NITROGLYCERIN 0.4 MG SL SUBL
SUBLINGUAL_TABLET | SUBLINGUAL | Status: AC
  Filled 2013-07-20: qty 1

## 2013-07-20 MED ORDER — SODIUM CHLORIDE 0.9 % IJ SOLN
3.0000 mL | Freq: Two times a day (BID) | INTRAMUSCULAR | Status: DC
  Administered 2013-07-20 – 2013-07-22 (×6): 3 mL via INTRAVENOUS

## 2013-07-20 MED ORDER — BIOTENE DRY MOUTH MT LIQD
15.0000 mL | Freq: Two times a day (BID) | OROMUCOSAL | Status: DC
  Administered 2013-07-20 – 2013-07-22 (×4): 15 mL via OROMUCOSAL

## 2013-07-20 MED ORDER — NITROGLYCERIN 0.4 MG SL SUBL
0.4000 mg | SUBLINGUAL_TABLET | SUBLINGUAL | Status: DC | PRN
  Administered 2013-07-20 (×2): 0.4 mg via SUBLINGUAL

## 2013-07-20 MED ORDER — ASPIRIN 81 MG PO CHEW
81.0000 mg | CHEWABLE_TABLET | Freq: Every day | ORAL | Status: DC
  Administered 2013-07-20 – 2013-07-22 (×3): 81 mg via ORAL
  Filled 2013-07-20 (×3): qty 1

## 2013-07-20 MED ORDER — PNEUMOCOCCAL VAC POLYVALENT 25 MCG/0.5ML IJ INJ
0.5000 mL | INJECTION | INTRAMUSCULAR | Status: AC
  Administered 2013-07-21: 0.5 mL via INTRAMUSCULAR
  Filled 2013-07-20: qty 0.5

## 2013-07-20 MED ORDER — ONDANSETRON HCL 4 MG/2ML IJ SOLN: 4.0000 mg | Freq: Four times a day (QID) | INTRAMUSCULAR | Status: DC | PRN

## 2013-07-20 MED ORDER — CLINDAMYCIN PHOSPHATE 600 MG/50ML IV SOLN
600.0000 mg | Freq: Once | INTRAVENOUS | Status: AC
  Administered 2013-07-20: 600 mg via INTRAVENOUS
  Filled 2013-07-20: qty 50

## 2013-07-20 MED ORDER — PRASUGREL HCL 10 MG PO TABS
10.0000 mg | ORAL_TABLET | Freq: Every day | ORAL | Status: DC
  Administered 2013-07-20 – 2013-07-22 (×3): 10 mg via ORAL
  Filled 2013-07-20 (×4): qty 1

## 2013-07-20 NOTE — ED Notes (Signed)
Discussed plan of care with Dr. Loretha StaplerWofford. Patient does not have lab results back yet, hold off with PO intake for now.

## 2013-07-20 NOTE — ED Notes (Signed)
First set of blood cultures walked to mini-lab.

## 2013-07-20 NOTE — ED Notes (Signed)
Dr. Wofford at the bedside.  

## 2013-07-20 NOTE — ED Notes (Signed)
Reported BNP of 1800 and WBC of 20.6 to Dr. Loretha StaplerWofford.  MD acknowledges, no new orders received.

## 2013-07-20 NOTE — ED Notes (Signed)
Explained to the patient that he cannot eat or drink until test results are back yet.

## 2013-07-20 NOTE — ED Notes (Signed)
Asked patient if he was able to void, no urge at this time. Urinal present and patient is aware to submit urine sample if urge arises.

## 2013-07-20 NOTE — Consult Note (Addendum)
Primary cardiologist: MA  HPI: 68 year old male with past medical history of morbid obesity, coronary artery disease, diabetes mellitus, hypertension for evaluation of chest pain. In 2012, he had anterior elevation myocardial infarction and was noted to have severe left main coronary artery stenosis due to plaque rupture. He was not a candidate for CABG due to morbid obesity. He underwent angioplasty and drug-eluting stent placement without complications. He presented in April of 2014 with non-ST elevation myocardial infarction. Cath reveales subtotal thrombotic occlusion of the right coronary artery with large thrombus. He underwent successful thrombectomy and 2 overlapped drug-eluting stent placements to the mid and proximal RCA. He presented 6 weeks later with inferior ST elevation MI due to late stent thrombosis after he stopped taking Effient. Treated with thrombectomy and balloon angioplasty. Ejection fraction was normal. He was readmitted few weeks later with bronchitis and atypical chest pain. Patient has been admitted with chest pain. He chest pain is described as "someone hitting me in the chest with a baseball bat". It is diffuse. It occurs with exertion and is relieved with rest. There is nausea and dyspnea but no diaphoresis. He has been noted to have lower extremity cellulitis and is being treated with antibiotics. Cardiology is asked to evaluate for chest pain. He has severe dyspnea on exertion which is chronic. He has chronic bilateral lower extremity edema.   Medications Prior to Admission  Medication Sig Dispense Refill  . acetaminophen (TYLENOL) 325 MG tablet Take 2 tablets (650 mg total) by mouth every 4 (four) hours as needed for mild pain or fever.  30 tablet  0  . betamethasone valerate (VALISONE) 0.1 % cream Apply 1 application topically 2 (two) times daily.      . prasugrel (EFFIENT) 10 MG TABS tablet Take 1 tablet (10 mg total) by mouth daily.  30 tablet  1  . nitroGLYCERIN  (NITROSTAT) 0.4 MG SL tablet Place 1 tablet (0.4 mg total) under the tongue every 5 (five) minutes as needed for chest pain (up to 3 doses).  25 tablet  3    No Known Allergies  Past Medical History  Diagnosis Date  . Hypertension   . Hyperlipidemia   . Morbid obesity   . NSVT (nonsustained ventricular tachycardia)     a. 04/2012 post MI; normal LVF   . Physical deconditioning     requiring outpatient PT  . Osteoarthritis   . Lower extremity cellulitis   . Peripheral edema   . Diabetes mellitus      Insulin-dependent  . Chronic knee pain   . Anemia, unspecified   . Ischemic cardiomyopathy     a. 4.2014 EF 40% by LV gram.  . Coronary artery disease     a. s/p anterior STEMI 07/13/10: tx with Promus DES to distal LM; s/p POBA to distal LAD;  b. 04/2012 NSTEMI/Cath/PCI: LM 20%, LAD 20% p, 73m, 100d, D1 60-70ost, D2 nl, D3 nl, LCX mid dzs, OM1/2/3 min irregs, RCA dom 60ost, 90p, 13m (3.5x38 & 4.0x24 Promus DES'), EF 40%, inf HK. c. 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  Marland Kitchen Anxiety   . Depression   . Sleep apnea   . CHF (congestive heart failure)     Past Surgical History  Procedure Laterality Date  . Coronary stent placement      drug-eluting to the distal left main and PTCA of the distal left anterior descending.  . Skin graft Right  history  . Knee surgery Left   . Cataract surgery    . Cardiac catheterization    . Cardiac catheterization      x2 stents;[facility]    History   Social History  . Marital Status: Married    Spouse Name: [name]    Number of Children: 2  . Years of Education: [name]   Occupational History  . Not on file.   Social History Main Topics  . Smoking status: Never Smoker   . Smokeless tobacco: Never Used  . Alcohol Use: No  . Drug Use: No  . Sexual Activity: Not Currently   Other Topics Concern  . Not on file   Social History Narrative  . No narrative on file     Family History  Problem Relation Age of Onset  . Heart attack Brother 68    died  . Coronary artery disease      strongly positive family hx of  . Heart attack Father     ROS:  He complains of chills but no fever. No productive cough, hemoptysis, dysphasia, odynophagia, melena, hematochezia, dysuria, hematuria, rash, seizure activity, claudication. Remaining systems are negative.  Physical Exam:   Blood pressure 127/61, pulse 96, temperature 98.4 F (36.9 C), temperature source Oral, resp. rate 22, height $RemoveBe'5\' 9"'QCnMDaLyF$  (1.753 m), weight 426 lb 5.9 oz (193.4 kg), SpO2 97.00%.  General:  Well developed/morbidly obese in NAD Skin warm/dry Patient not depressed No peripheral clubbing Back-normal HEENT-normal/normal eyelids Neck supple/normal carotid upstroke bilaterally; no bruits; no JVD; no thyromegaly chest - CTA/ normal expansion CV - heart sounds are extremely diminished due to obesity. No obvious murmurs. Abdomen -morbid obesity. No tenderness. Cannot appreciate masses. Ext-2+ edema with chronic skin changes. Probable cellulitis posterior left lower extremity. Neuro-grossly nonfocal  ECG sinus rhythm with nonspecific ST changes. Prior anterior infarct.   Results for orders placed during the hospital encounter of 07/20/13 (from the past 48 hour(s))  I-STAT TROPOININ, ED     Status: None   Collection Time    07/20/13  3:54 AM      Result Value Ref Range   Troponin i, poc 0.01  0.00 - 0.08 ng/mL   Comment 3            Comment: Due to the release kinetics of cTnI,     a negative result within the first hours     of the onset of symptoms does not rule out     myocardial infarction with certainty.     If myocardial infarction is still suspected,     repeat the test at appropriate intervals.  CBC     Status: Abnormal   Collection Time    07/20/13  4:00 AM      Result Value Ref Range   WBC 20.6 (*) 4.0 - 10.5 K/uL   RBC 5.03  4.22 - 5.81 MIL/uL   Hemoglobin 12.9 (*) 13.0 -  17.0 g/dL   HCT 40.9  39.0 - 52.0 %   MCV 81.3  78.0 - 100.0 fL   MCH 25.6 (*) 26.0 - 34.0 pg   MCHC 31.5  30.0 - 36.0 g/dL   RDW 14.8  11.5 - 15.5 %   Platelets 230  150 - 400 K/uL  BASIC METABOLIC PANEL     Status: Abnormal   Collection Time    07/20/13  4:00 AM      Result Value Ref Range   Sodium 136 (*) 137 - 147 mEq/L  Potassium 4.3  3.7 - 5.3 mEq/L   Chloride 97  96 - 112 mEq/L   CO2 25  19 - 32 mEq/L   Glucose, Bld 290 (*) 70 - 99 mg/dL   BUN 13  6 - 23 mg/dL   Creatinine, Ser 0.69  0.50 - 1.35 mg/dL   Calcium 8.9  8.4 - 10.5 mg/dL   GFR calc non Af Amer >90  >90 mL/min   GFR calc Af Amer >90  >90 mL/min   Comment: (NOTE)     The eGFR has been calculated using the CKD EPI equation.     This calculation has not been validated in all clinical situations.     eGFR's persistently <90 mL/min signify possible Chronic Kidney     Disease.  PRO B NATRIURETIC PEPTIDE     Status: Abnormal   Collection Time    07/20/13  4:00 AM      Result Value Ref Range   Pro B Natriuretic peptide (BNP) 1810.0 (*) 0 - 125 pg/mL  GLUCOSE, CAPILLARY     Status: Abnormal   Collection Time    07/20/13  8:26 AM      Result Value Ref Range   Glucose-Capillary 271 (*) 70 - 99 mg/dL   Comment 1 Notify RN    GLUCOSE, CAPILLARY     Status: Abnormal   Collection Time    07/20/13 11:49 AM      Result Value Ref Range   Glucose-Capillary 237 (*) 70 - 99 mg/dL   Comment 1 Notify RN      Dg Chest Port 1 View  07/20/2013   CLINICAL DATA:  Mid chest pain  EXAM: PORTABLE CHEST - 1 VIEW  COMPARISON:  01/30/2013  FINDINGS: Shallow inspiration. The heart size and mediastinal contours are within normal limits. Both lungs are clear. The visualized skeletal structures are unremarkable.  IMPRESSION: No active disease.   Electronically Signed   By: Lucienne Capers M.D.   On: 07/20/2013 03:22    Assessment/Plan 1 unstable angina-the patient symptoms are concerning as he describes exertional chest pain relieved  with rest. Significant cardiac history. Very difficult situation given morbid obesity. Functional study would not be useful. I have recommended cardiac catheterization once his cellulitis improves. We will need to make sure he is awake can be accommodated by the Cath Lab table. We would most likely proceed on Monday once his cellulitis improves. Continue Effient and add aspirin. Add statin. Add low-dose beta blocker. 2 cellulitis-agree with antibiotics. 3 morbid obesity-the patient's obesity is life-threatening. I discussed weight loss. 4 coronary artery disease-add aspirin, statin and low-dose beta blocker. Continue effient. 5 diabetes mellitus-management per primary care. 6 hyperlipidemia-resume statin 7 lower extremity edema-agree with diuresis. Follow renal function.  Kirk Ruths MD 07/20/2013, 12:25 PM

## 2013-07-20 NOTE — ED Notes (Signed)
Phlebotomy at the bedside  

## 2013-07-20 NOTE — ED Notes (Signed)
Pt arrives via EMS from home. C/o chest pain. States that he has chronic chest pain from anxiety but tonight it was worse. EMS gave pt 324 ASA, 1 dose NTG, 4 mg IV Zofran. 12 lead unremarkable, ST. 122/55 HR 107.

## 2013-07-20 NOTE — H&P (Signed)
[facility] History and Physical  [name] XWR:[id number] DOB: September 13, 1945 DOA: 07/20/2013  Referring physician: Loretha Stapler PCP: Barbette Reichmann, MD   Chief Complaint: Chest pain  HPI: [name] is a 68 y.o. male with past medical history of CHF, CAD, HTN and DM 2 with morbid obesity. Patient given to the hospital because of chest pain. Patient is a very poor historian, has history of medical non-adherence. York Spaniel last time he saw his primary care physician or his cardiologist was about a year ago. He came in today for chest pain being gone on for the past 2 weeks, he said usually when he lays down or walks he gets very sharp chest pain like someone hitting him with a baseball bat across his chest. He denies palpitations, denies radiation of the pain. When I evaluated him he was chest pain-free, patient has history of non-STEMI. In the ED patient was he was found to have leukocytosis of 20,000, left lower extremity cellulitis and bilateral lower extremity swelling consistent with fluid overload.  Review of Systems:  Constitutional: negative for anorexia, fevers and sweats Eyes: negative for irritation, redness and visual disturbance Ears, nose, mouth, throat, and face: negative for earaches, epistaxis, nasal congestion and sore throat Respiratory: negative for cough, dyspnea on exertion, sputum and wheezing Cardiovascular: negative for chest pain, dyspnea, lower extremity edema, orthopnea, palpitations and syncope Gastrointestinal: negative for abdominal pain, constipation, diarrhea, melena, nausea and vomiting Genitourinary:negative for dysuria, frequency and hematuria Hematologic/lymphatic: negative for bleeding, easy bruising and lymphadenopathy Musculoskeletal:negative for arthralgias, muscle weakness and stiff joints Neurological: negative for coordination problems, gait problems, headaches and weakness Endocrine: negative for diabetic symptoms including polydipsia, polyuria and  weight loss Allergic/Immunologic: negative for anaphylaxis, hay fever and urticaria  Past Medical History  Diagnosis Date  . Hypertension   . Hyperlipidemia   . Morbid obesity   . NSVT (nonsustained ventricular tachycardia)     a. 04/2012 post MI; normal LVF   . Physical deconditioning     requiring outpatient PT  . Osteoarthritis   . Lower extremity cellulitis   . Peripheral edema   . Diabetes mellitus      Insulin-dependent  . Chronic knee pain   . Anemia, unspecified   . Ischemic cardiomyopathy     a. 4.2014 EF 40% by LV gram.  . Coronary artery disease     a. s/p anterior STEMI 07/13/10: tx with Promus DES to distal LM; s/p POBA to distal LAD;  b. 04/2012 NSTEMI/Cath/PCI: LM 20%, LAD 20% p, 37m, 100d, D1 60-70ost, D2 nl, D3 nl, LCX mid dzs, OM1/2/3 min irregs, RCA dom 60ost, 90p, 50m (3.5x38 & 4.0x24 Promus DES'), EF 40%, inf HK. c. 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  Marland Kitchen Anxiety   . Depression   . Sleep apnea   . CHF (congestive heart failure)    Past Surgical History  Procedure Laterality Date  . Coronary stent placement      drug-eluting to the distal left main and PTCA of the distal left anterior descending.  . Skin graft Right     history  . Knee surgery Left   . Cataract surgery    . Cardiac catheterization    . Cardiac catheterization      x2 stents;[facility]   Social History:   reports that he has never smoked. He has never used smokeless tobacco. He reports that he does not drink alcohol or use illicit drugs.  No Known Allergies  Family History  Problem Relation Age of Onset  . Heart attack Brother 60    died  . Coronary artery disease      strongly positive family hx of  . Heart attack Father      Prior to Admission medications   Medication Sig Start Date End Date Taking? Authorizing Provider  acetaminophen (TYLENOL) 325 MG tablet Take 2 tablets (650 mg total) by mouth every 4  (four) hours as needed for mild pain or fever. 02/01/13  Yes Rocco SereneMorgan Rogers, MD  betamethasone valerate (VALISONE) 0.1 % cream Apply 1 application topically 2 (two) times daily.   Yes Historical Provider, MD  prasugrel (EFFIENT) 10 MG TABS tablet Take 1 tablet (10 mg total) by mouth daily. 05/11/13  Yes Iran OuchMuhammad A Arida, MD  nitroGLYCERIN (NITROSTAT) 0.4 MG SL tablet Place 1 tablet (0.4 mg total) under the tongue every 5 (five) minutes as needed for chest pain (up to 3 doses). 06/25/12   Dayna N Dunn, PA-C   Physical Exam: Filed Vitals:   07/20/13 0818  BP: 127/61  Pulse: 96  Temp: 98.4 F (36.9 C)  Resp: 22   Constitutional: Oriented to person, place, and time. Well-developed and well-nourished. Cooperative.  Head: Normocephalic and atraumatic.  Nose: Nose normal.  Mouth/Throat: Uvula is midline, oropharynx is clear and moist and mucous membranes are normal.  Eyes: Conjunctivae and EOM are normal. Pupils are equal, round, and reactive to light.  Neck: Trachea normal and normal range of motion. Neck supple.  Cardiovascular: Normal rate, regular rhythm, S1 normal, S2 normal, normal heart sounds and intact distal pulses.   Pulmonary/Chest: Effort normal and breath sounds normal.  Abdominal: Soft. Bowel sounds are normal. There is no hepatosplenomegaly. There is no tenderness.  Musculoskeletal: Normal range of motion.  Neurological: Alert and oriented to person, place, and time. He has normal strength. No cranial nerve deficit or sensory deficit.  Skin: Skin is warm, dry and intact.  Psychiatric: Has a normal mood and affect. Speech is normal and behavior is normal.   Labs on Admission:  Basic Metabolic Panel:  Recent Labs Lab 07/20/13 0400  NA 136*  K 4.3  CL 97  CO2 25  GLUCOSE 290*  BUN 13  CREATININE 0.69  CALCIUM 8.9   Liver Function Tests: No results found for this basename: AST, ALT, ALKPHOS, BILITOT, PROT, ALBUMIN,  in the last 168 hours No results found for this  basename: LIPASE, AMYLASE,  in the last 168 hours No results found for this basename: AMMONIA,  in the last 168 hours CBC:  Recent Labs Lab 07/20/13 0400  WBC 20.6*  HGB 12.9*  HCT 40.9  MCV 81.3  PLT 230   Cardiac Enzymes: No results found for this basename: CKTOTAL, CKMB, CKMBINDEX, TROPONINI,  in the last 168 hours  BNP (last 3 results)  Recent Labs  01/30/13 0835 07/20/13 0400  PROBNP 3113.0* 1810.0*   CBG:  Recent Labs Lab 07/20/13 0826  GLUCAP 271*    Radiological Exams on Admission: Dg Chest Port 1 View  07/20/2013   CLINICAL DATA:  Mid chest pain  EXAM: PORTABLE CHEST - 1 VIEW  COMPARISON:  01/30/2013  FINDINGS: Shallow inspiration. The heart size and mediastinal contours are within normal limits. Both lungs are clear. The visualized skeletal structures are unremarkable.  IMPRESSION: No active disease.   Electronically Signed   By: Burman NievesWilliam  Stevens M.D.   On: 07/20/2013 03:22    EKG: Independently reviewed.  Assessment/Plan Active Problems:   Coronary artery disease   Hypertension   Morbid obesity   Cellulitis   DM (diabetes mellitus), type 2   History of medication noncompliance   Other chest pain    Cellulitis -Left lower extremity cellulitis, his WBC about 20,000. -Started on clindamycin in the emergency department, continued. -Blood cultures obtained, follow closely.  Chest pain -Patient has history of non-STEMI, patient is on aspirin and Effient, continued. -Rule out acute coronary syndrome, 3 sets of cardiac enzymes. -Cardiology consulted.  Chronic combined CHF -He might even have biventricular CHF, last 2-D echo in May of 2014 showed ejection fraction about 40-45%. -Chart also he might have right-sided heart failure. -Started him on IV Lasix, he has very peripheral congestion with relatively clear x-ray.  Diabetes mellitus type 2 -Patient is not on hypoglycemic agent. -Start on carbohydrate modified diet, insulin sliding  scale. -Check hemoglobin A1c, he will probably need hypoglycemic agents on discharge.  Morbid obesity -Counseled extensively about weight loss.  Code Status: Full code Family Communication: Plan discussed with the patient Disposition Plan: Telemetry, inpatient  Time spent: 70 minutes  [facility] TogusELMAHI,MUTAZ A [facility] Pager [phone]

## 2013-07-20 NOTE — ED Notes (Signed)
Attempted to call report

## 2013-07-20 NOTE — ED Notes (Signed)
EKG given to Dr. Wofford 

## 2013-07-20 NOTE — ED Notes (Signed)
Readjusted pain for comfort.

## 2013-07-20 NOTE — ED Notes (Signed)
Reviewed blood sugar with Dr. Loretha StaplerWofford, no coverage at this time.

## 2013-07-20 NOTE — ED Notes (Signed)
MD gives verbal order for troponin, CBC and BMP for now.

## 2013-07-20 NOTE — ED Provider Notes (Signed)
CSN: [id number]     Arrival date & time 07/20/13  0244 History   First MD Initiated Contact with Patient 07/20/13 0327     Chief Complaint  Patient presents with  . Chest Pain     (Consider location/radiation/quality/duration/timing/severity/associated sxs/prior Treatment) Patient is a 68 y.o. male presenting with chest pain.  Chest Pain Pain location:  Substernal area Pain quality: sharp   Pain radiates to:  Does not radiate Pain severity:  Severe Onset quality:  Sudden Duration: a few hours. Timing:  Constant Progression:  Partially resolved Chronicity:  Recurrent Context: at rest   Relieved by: nitro provided by EMS. Exacerbated by: lying flat. Associated symptoms: cough, nausea and vomiting   Associated symptoms: no abdominal pain, no fever and no shortness of breath   Associated symptoms comment:  Chills   Past Medical History  Diagnosis Date  . Hypertension   . Hyperlipidemia   . Morbid obesity   . NSVT (nonsustained ventricular tachycardia)     a. 04/2012 post MI; normal LVF   . Physical deconditioning     requiring outpatient PT  . Osteoarthritis   . Lower extremity cellulitis   . Peripheral edema   . Diabetes mellitus      Insulin-dependent  . Chronic knee pain   . Anemia, unspecified   . Ischemic cardiomyopathy     a. 4.2014 EF 40% by LV gram.  . Coronary artery disease     a. s/p anterior STEMI 07/13/10: tx with Promus DES to distal LM; s/p POBA to distal LAD;  b. 04/2012 NSTEMI/Cath/PCI: LM 20%, LAD 20% p, 8071m, 100d, D1 60-70ost, D2 nl, D3 nl, LCX mid dzs, OM1/2/3 min irregs, RCA dom 60ost, 90p, 7820m (3.5x38 & 4.0x24 Promus DES'), EF 40%, inf HK. c. 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  Marland Kitchen. Anxiety   . Depression   . Sleep apnea   . CHF (congestive heart failure)    Past Surgical History  Procedure Laterality Date  . Coronary stent placement      drug-eluting to the distal  left main and PTCA of the distal left anterior descending.  . Skin graft Right     history  . Knee surgery Left   . Cataract surgery    . Cardiac catheterization    . Cardiac catheterization      x2 stents;[facility]   Family History  Problem Relation Age of Onset  . Heart attack Brother 60    died  . Coronary artery disease      strongly positive family hx of  . Heart attack Father    History  Substance Use Topics  . Smoking status: Never Smoker   . Smokeless tobacco: Never Used  . Alcohol Use: No    Review of Systems  Constitutional: Negative for fever.  Respiratory: Positive for cough. Negative for shortness of breath.   Cardiovascular: Positive for chest pain.  Gastrointestinal: Positive for nausea and vomiting. Negative for abdominal pain.  All other systems reviewed and are negative.     Allergies  Review of patient's allergies indicates no known allergies.  Home Medications   Prior to Admission medications   Medication Sig Start Date End Date Taking? Authorizing Provider  acetaminophen (TYLENOL) 325 MG tablet Take 2 tablets (650 mg total) by mouth every 4 (four) hours as needed for mild pain or fever. 02/01/13  Yes Rocco SereneMorgan Rogers, MD  betamethasone valerate (VALISONE) 0.1 % cream  Apply 1 application topically 2 (two) times daily.   Yes Historical Provider, MD  prasugrel (EFFIENT) 10 MG TABS tablet Take 1 tablet (10 mg total) by mouth daily. 05/11/13  Yes Iran OuchMuhammad A Arida, MD  nitroGLYCERIN (NITROSTAT) 0.4 MG SL tablet Place 1 tablet (0.4 mg total) under the tongue every 5 (five) minutes as needed for chest pain (up to 3 doses). 06/25/12   Dayna N Dunn, PA-C   BP 104/46  Pulse 99  Temp(Src) 98.7 F (37.1 C) (Oral)  Resp 17  Ht 5\' 11"  (1.803 m)  Wt 190 lb (86.183 kg)  BMI 26.51 kg/m2  SpO2 94% Physical Exam  Nursing note and vitals reviewed. Constitutional: He is oriented to person, place, and time. He appears well-developed and well-nourished. No distress.  obese   HENT:  Head: Normocephalic and atraumatic.  Mouth/Throat: Oropharynx is clear and moist.  Eyes: Conjunctivae are normal. Pupils are equal, round, and reactive to light. No scleral icterus.  Neck: Neck supple.  Cardiovascular: Normal rate, regular rhythm, normal heart sounds and intact distal pulses.   No murmur heard. Pulmonary/Chest: Effort normal and breath sounds normal. No stridor. No respiratory distress. He has no wheezes. He has no rales. He exhibits no tenderness.  Abdominal: Soft. He exhibits no distension. There is no tenderness. There is no rebound and no guarding.  Musculoskeletal: Normal range of motion. He exhibits no edema.  Neurological: He is alert and oriented to person, place, and time.  Skin: Skin is warm and dry. No rash noted.  Psychiatric: He has a normal mood and affect. His behavior is normal.    ED Course  Procedures (including critical care time) Labs Review Labs Reviewed  CBC - Abnormal; Notable for the following:    WBC 20.6 (*)    Hemoglobin 12.9 (*)    MCH 25.6 (*)    All other components within normal limits  BASIC METABOLIC PANEL - Abnormal; Notable for the following:    Sodium 136 (*)    Glucose, Bld 290 (*)    All other components within normal limits  PRO B NATRIURETIC PEPTIDE - Abnormal; Notable for the following:    Pro B Natriuretic peptide (BNP) 1810.0 (*)    All other components within normal limits  CULTURE, BLOOD (ROUTINE X 2)  CULTURE, BLOOD (ROUTINE X 2)  I-STAT TROPOININ, ED    Imaging Review Dg Chest Port 1 View  07/20/2013   CLINICAL DATA:  Mid chest pain  EXAM: PORTABLE CHEST - 1 VIEW  COMPARISON:  01/30/2013  FINDINGS: Shallow inspiration. The heart size and mediastinal contours are within normal limits. Both lungs are clear. The visualized skeletal structures are unremarkable.  IMPRESSION: No active disease.   Electronically Signed   By: Burman NievesWilliam  Stevens M.D.   On: 07/20/2013 03:22  All radiology studies independently viewed  by me.        EKG Interpretation   Date/Time:  Thursday July 20 2013 02:50:29 EDT Ventricular Rate:  102 PR Interval:  179 QRS Duration: 102 QT Interval:  351 QTC Calculation: 457 R Axis:   31 Text Interpretation:  Age not entered, assumed to be  68 years old for  purpose of ECG interpretation Sinus tachycardia Low voltage, precordial  leads compared to prior, t wave changes have resolved. Confirmed by  [facility]  MD, TREY ([id number]) on 07/20/2013 4:40:40 AM      MDM   Final diagnoses:  Cellulitis of left leg  Other chest pain    68 yo  male presenting with the chief complaint of positional chest pain.  ASA given by EMS.  EKG appears better than prior.  Atypical for ACS.  CXR unremarkable.    However, labs show significant leukocytosis.  On further history, pt reported chills, rigors, and left lower extremity pain and redness.  He reports prior history of cellulitis, requiring hospitalization.  On exam of left leg, it is warm, red, although very edematous like his right leg.  This likely represents developing cellulitis.  Given IV clinda, admitted to internal medicine.      Candyce Churn III, MD 07/20/13 [phone]

## 2013-07-20 NOTE — ED Notes (Signed)
Reported to Dr. Loretha StaplerWofford that patient is requesting drink, and 1st set of blood cultures have been collected.  MD allows patient to have water at this time. Provided ice water.

## 2013-07-21 DIAGNOSIS — G4730 Sleep apnea, unspecified: Secondary | ICD-10-CM

## 2013-07-21 DIAGNOSIS — L02419 Cutaneous abscess of limb, unspecified: Secondary | ICD-10-CM

## 2013-07-21 DIAGNOSIS — L03119 Cellulitis of unspecified part of limb: Secondary | ICD-10-CM

## 2013-07-21 DIAGNOSIS — I158 Other secondary hypertension: Secondary | ICD-10-CM

## 2013-07-21 DIAGNOSIS — E118 Type 2 diabetes mellitus with unspecified complications: Secondary | ICD-10-CM

## 2013-07-21 DIAGNOSIS — I2589 Other forms of chronic ischemic heart disease: Secondary | ICD-10-CM

## 2013-07-21 LAB — GLUCOSE, CAPILLARY
GLUCOSE-CAPILLARY: 172 mg/dL — AB (ref 70–99)
Glucose-Capillary: 277 mg/dL — ABNORMAL HIGH (ref 70–99)
Glucose-Capillary: 201 mg/dL — ABNORMAL HIGH (ref 70–99)
Glucose-Capillary: 229 mg/dL — ABNORMAL HIGH (ref 70–99)

## 2013-07-21 LAB — CBC
HCT: 37.1 % — ABNORMAL LOW (ref 39.0–52.0)
Hemoglobin: 11.7 g/dL — ABNORMAL LOW (ref 13.0–17.0)
MCH: 25.6 pg — ABNORMAL LOW (ref 26.0–34.0)
MCHC: 31.5 g/dL (ref 30.0–36.0)
MCV: 81.2 fL (ref 78.0–100.0)
PLATELETS: 200 10*3/uL (ref 150–400)
RBC: 4.57 MIL/uL (ref 4.22–5.81)
RDW: 15.1 % (ref 11.5–15.5)
WBC: 9.2 10*3/uL (ref 4.0–10.5)

## 2013-07-21 LAB — BASIC METABOLIC PANEL
BUN: 12 mg/dL (ref 6–23)
CHLORIDE: 96 meq/L (ref 96–112)
CO2: 25 mEq/L (ref 19–32)
Calcium: 8.4 mg/dL (ref 8.4–10.5)
Creatinine, Ser: 0.72 mg/dL (ref 0.50–1.35)
GFR calc non Af Amer: >90 mL/min (ref >90)
Glucose, Bld: 197 mg/dL — ABNORMAL HIGH (ref 70–99)
POTASSIUM: 4 meq/L (ref 3.7–5.3)
Sodium: 134 mEq/L — ABNORMAL LOW (ref 137–147)

## 2013-07-21 LAB — URINE CULTURE

## 2013-07-21 LAB — TROPONIN I: Troponin I: <0.3 ng/mL (ref <0.30)

## 2013-07-21 MED ORDER — FUROSEMIDE 10 MG/ML IJ SOLN: 8.0000 mg/h | INTRAVENOUS | Status: DC

## 2013-07-21 MED ORDER — FUROSEMIDE 10 MG/ML IJ SOLN
60.0000 mg | Freq: Two times a day (BID) | INTRAMUSCULAR | Status: DC
  Administered 2013-07-21 – 2013-07-22 (×4): 60 mg via INTRAVENOUS
  Filled 2013-07-21 (×4): qty 6

## 2013-07-21 MED ORDER — TRIAMCINOLONE ACETONIDE 0.1 % EX CREA
TOPICAL_CREAM | Freq: Two times a day (BID) | CUTANEOUS | Status: DC
  Administered 2013-07-21 – 2013-07-22 (×3): via TOPICAL
  Filled 2013-07-21: qty 15

## 2013-07-21 MED ORDER — INSULIN DETEMIR 100 UNIT/ML ~~LOC~~ SOLN
30.0000 [IU] | Freq: Every day | SUBCUTANEOUS | Status: DC
  Administered 2013-07-21 – 2013-07-22 (×2): 30 [IU] via SUBCUTANEOUS
  Filled 2013-07-21 (×3): qty 0.3

## 2013-07-21 NOTE — Progress Notes (Signed)
Utilization review completed.  

## 2013-07-21 NOTE — Progress Notes (Signed)
Paged PT dept. x3 and  no call back received r/t pt with order.  Amanda PeaNellie Buck, Charity fundraiserN.

## 2013-07-21 NOTE — Progress Notes (Signed)
Patient Name: [name] Date of Encounter: 07/21/2013     Active Problems:   Coronary artery disease   Hypertension   Morbid obesity   Cellulitis   DM (diabetes mellitus), type 2   History of medication noncompliance   Other chest pain   Unstable angina    SUBJECTIVE  No CP. No SOB. Chronic orthopnea. He is refusing cardiac cath right now.   CURRENT MEDS . antiseptic oral rinse  15 mL Mouth Rinse BID  . aspirin  81 mg Oral Daily  . atorvastatin  80 mg Oral q1800  . betamethasone valerate lotion   Topical BID  . clindamycin (CLEOCIN) IV  300 mg Intravenous 3 times per day  . furosemide  60 mg Intravenous BID  . heparin  5,000 Units Subcutaneous 3 times per day  . insulin aspart  0-15 Units Subcutaneous TID WC  . metoprolol tartrate  12.5 mg Oral BID  . pneumococcal 23 valent vaccine  0.5 mL Intramuscular Tomorrow-1000  . prasugrel  10 mg Oral Daily  . sodium chloride  3 mL Intravenous Q12H    OBJECTIVE  Filed Vitals:   07/20/13 1848 07/20/13 2050 07/21/13 0218 07/21/13 0719  BP: 116/63 129/62 132/58 120/55  Pulse: 83 76 80 72  Temp: 98.1 F (36.7 C) 99 F (37.2 C) 98.7 F (37.1 C) 97.7 F (36.5 C)  TempSrc: Oral Oral Oral Oral  Resp: 18 20 20 18   Height:      Weight:    423 lb 12.8 oz (192.234 kg)  SpO2: 97% 91% 92% 93%    Intake/Output Summary (Last 24 hours) at 07/21/13 16100812 Last data filed at 07/20/13 2102  Gross per 24 hour  Intake    960 ml  Output   1380 ml  Net   -420 ml   Filed Weights   07/20/13 0249 07/20/13 0806 07/21/13 0719  Weight: 190 lb (86.183 kg) 426 lb 5.9 oz (193.4 kg) 423 lb 12.8 oz (192.234 kg)    PHYSICAL EXAM  General: Well developed/morbidly obese in NAD  Skin warm/dry  Patient not depressed  No peripheral clubbing  Back-normal  HEENT-normal/normal eyelids  Neck supple/normal carotid upstroke bilaterally; no bruits; no JVD; no thyromegaly  chest - CTA/ normal expansion  CV - heart sounds are extremely diminished  due to obesity. No obvious murmurs.  Abdomen -morbid obesity. No tenderness. Cannot appreciate masses.  Ext-2+ edema with chronic skin changes. Probable cellulitis posterior left lower extremity.  Neuro-grossly nonfocal  Accessory Clinical Findings  CBC  Recent Labs  07/20/13 0400 07/21/13 0445  WBC 20.6* 9.2  HGB 12.9* 11.7*  HCT 40.9 37.1*  MCV 81.3 81.2  PLT 230 200   Basic Metabolic Panel  Recent Labs  07/20/13 0400 07/21/13 0445  NA 136* 134*  K 4.3 4.0  CL 97 96  CO2 25 25  GLUCOSE 290* 197*  BUN 13 12  CREATININE 0.69 0.72  CALCIUM 8.9 8.4    Cardiac Enzymes  Recent Labs  07/20/13 1245 07/20/13 1810 07/20/13 2350  TROPONINI <0.30 <0.30 <0.30    D-Dimer  Recent Labs  07/20/13 1245  DDIMER 0.55*   Hemoglobin A1C  Recent Labs  07/20/13 1245  HGBA1C 9.5*    Thyroid Function Tests  Recent Labs  07/20/13 1245  TSH 1.850    TELE  NSR  Radiology/Studies  Dg Chest Port 1 View  07/20/2013   CLINICAL DATA:  Mid chest pain  EXAM: PORTABLE CHEST - 1 VIEW  COMPARISON:  01/30/2013  FINDINGS: Shallow inspiration. The heart size and mediastinal contours are within normal limits. Both lungs are clear. The visualized skeletal structures are unremarkable.  IMPRESSION: No active disease.      ASSESSMENT AND PLAN  68 year old male with past medical history of morbid obesity, CAD s/p ant STEMI s/p DES to dLM (2012), DES to RCA (2014) with late stent thrombosis after he stopped taking Effient 6 weeks later,  diabetes mellitus, hypertension for evaluation of chest pain  Unstable angina-the patient symptoms are concerning as he describes exertional chest pain relieved with rest. Significant cardiac history. Very difficult situation given morbid obesity. Functional study would not be useful. Dr. Jens Somrenshaw  recommended cardiac catheterization once his cellulitis improves.  -- He admits to not taking ASA for ~ 6 months because "he ran out."  -- As of right  now he is refusing cardiac cath.  -- Continue medical therapy. added aspirin back, statin and low-dose beta blocker. Continue effient.   Cellulitis-agree with antibiotics. Per IM -- Leukocytosis of 20,000, left lower extremity cellulitis   Morbid obesity-the patient's obesity is life-threatening. Weight loss discussed  Diabetes mellitus-management per primary care. HgA1c 9.5  HLD-resume statin   Chronic combined CHF  -- ECHO in May of 2014 showed ejection fraction about 40-45%.  -- Started him on IV Lasix, he has very peripheral congestion with relatively clear x-ray.Follow renal function  Elevated D-Dimer- high risk for DVT/PE with obesity and sedentary. Massive LE  swelling bilaterally. Per IM   SignedThereasa Parkin, STERN, KATHRYN PA-C  Pager [phone]  Attending Note:   The patient was seen and examined.  Agree with assessment and plan as noted above.  Changes made to the above note as needed.  See my note from today. He refuses cath.  Will sign off. Call for questions   Alvia GrovePhilip J. Nahser, Jr., MD, [facility]ACC 07/21/2013, 2:20 PM

## 2013-07-21 NOTE — Progress Notes (Signed)
The patient did not have any complaints of pain and did not receive any PRN medications overnight.  His VS are stable.

## 2013-07-21 NOTE — Progress Notes (Signed)
[facility] PROGRESS NOTE  Assessment/Plan: *Cellulitis: - afebrile, change clindamycin to oral. - BC pending.  Unstable angina - cardiology consulted recommended cath, he refused. - cont ASA, statins, metoprolol and effient   DM (diabetes mellitus), type 2  Chronic combined CHF  - He might even have biventricular CHF, last 2-D echo in May of 2014 showed ejection fraction about 40-45%.  - Chart also he might have right-sided heart failure.  - Cont him on IV Lasix.  Diabetes mellitus type 2  -Start on carbohydrate modified diet, insulin sliding scale.  -hemoglobin A1 9.5c, start levemir.  Morbid obesity  -Counseled extensively about weight loss.    Code Status: Full code  Family Communication: Plan discussed with the patient  Disposition Plan: Telemetry, inpatient    Consultants:  cards  Procedures:  CXR  Antibiotics:  Clindamycin 6.25.2015  HPI/Subjective: Feels better no comaplins  Objective: Filed Vitals:   07/20/13 1848 07/20/13 2050 07/21/13 0218 07/21/13 0719  BP: 116/63 129/62 132/58 120/55  Pulse: 83 76 80 72  Temp: 98.1 F (36.7 C) 99 F (37.2 C) 98.7 F (37.1 C) 97.7 F (36.5 C)  TempSrc: Oral Oral Oral Oral  Resp: 18 20 20 18   Height:      Weight:    192.234 kg (423 lb 12.8 oz)  SpO2: 97% 91% 92% 93%    Intake/Output Summary (Last 24 hours) at 07/21/13 1012 Last data filed at 07/21/13 0839  Gross per 24 hour  Intake   1320 ml  Output   1380 ml  Net    -60 ml   Filed Weights   07/20/13 0249 07/20/13 0806 07/21/13 0719  Weight: 86.183 kg (190 lb) 193.4 kg (426 lb 5.9 oz) 192.234 kg (423 lb 12.8 oz)    Exam:  General: Alert, awake, oriented x3, in no acute distress and morbid obese HEENT: No bruits, no goiter.  Heart: Regular rate and rhythm. Lungs: Good air movement, bilateral air movement.  Abdomen: Soft, nontender, nondistended, positive bowel sounds.     Data Reviewed: Basic Metabolic Panel:  Recent  Labs Lab 07/20/13 0400 07/21/13 0445  NA 136* 134*  K 4.3 4.0  CL 97 96  CO2 25 25  GLUCOSE 290* 197*  BUN 13 12  CREATININE 0.69 0.72  CALCIUM 8.9 8.4   Liver Function Tests: No results found for this basename: AST, ALT, ALKPHOS, BILITOT, PROT, ALBUMIN,  in the last 168 hours No results found for this basename: LIPASE, AMYLASE,  in the last 168 hours No results found for this basename: AMMONIA,  in the last 168 hours CBC:  Recent Labs Lab 07/20/13 0400 07/21/13 0445  WBC 20.6* 9.2  HGB 12.9* 11.7*  HCT 40.9 37.1*  MCV 81.3 81.2  PLT 230 200   Cardiac Enzymes:  Recent Labs Lab 07/20/13 1245 07/20/13 1810 07/20/13 2350  TROPONINI <0.30 <0.30 <0.30   BNP (last 3 results)  Recent Labs  01/30/13 0835 07/20/13 0400  PROBNP 3113.0* 1810.0*   CBG:  Recent Labs Lab 07/20/13 0826 07/20/13 1149 07/20/13 1629 07/20/13 2143 07/21/13 0645  GLUCAP 271* 237* 199* 195* 277*    Recent Results (from the past 240 hour(s))  CULTURE, BLOOD (ROUTINE X 2)     Status: None   Collection Time    07/20/13  6:40 AM      Result Value Ref Range Status   Specimen Description BLOOD RIGHT ANTECUBITAL   Final   Special Requests BOTTLES DRAWN AEROBIC AND ANAEROBIC 5CCS  Final   Culture  Setup Time     Final   Value: 07/20/2013 12:34     [facility]   Culture     Final   Value:        BLOOD CULTURE RECEIVED NO GROWTH TO DATE CULTURE WILL BE HELD FOR 5 DAYS BEFORE ISSUING A FINAL NEGATIVE REPORT     [facility]formed at [facility]   Report Status PENDING   Incomplete  CULTURE, BLOOD (ROUTINE X 2)     Status: None   Collection Time    07/20/13  7:30 AM      Result Value Ref Range Status   Specimen Description BLOOD RIGHT ANTECUBITAL   Final   Special Requests BOTTLES DRAWN AEROBIC AND ANAEROBIC 10MLS   Final   Culture  Setup Time     Final   Value: 07/20/2013 12:33     [facility]formed at [facility]   Culture     Final   Value:        BLOOD  CULTURE RECEIVED NO GROWTH TO DATE CULTURE WILL BE HELD FOR 5 DAYS BEFORE ISSUING A FINAL NEGATIVE REPORT     [facility]formed at [facility]   Report Status PENDING   Incomplete     Studies: Dg Chest Port 1 View  07/20/2013   CLINICAL DATA:  Mid chest pain  EXAM: PORTABLE CHEST - 1 VIEW  COMPARISON:  01/30/2013  FINDINGS: Shallow inspiration. The heart size and mediastinal contours are within normal limits. Both lungs are clear. The visualized skeletal structures are unremarkable.  IMPRESSION: No active disease.   Electronically Signed   By: Burman NievesWilliam  Stevens M.D.   On: 07/20/2013 03:22    Scheduled Meds: . antiseptic oral rinse  15 mL Mouth Rinse BID  . aspirin  81 mg Oral Daily  . atorvastatin  80 mg Oral q1800  . betamethasone valerate lotion   Topical BID  . clindamycin (CLEOCIN) IV  300 mg Intravenous 3 times per day  . furosemide  60 mg Intravenous BID  . heparin  5,000 Units Subcutaneous 3 times per day  . insulin aspart  0-15 Units Subcutaneous TID WC  . metoprolol tartrate  12.5 mg Oral BID  . pneumococcal 23 valent vaccine  0.5 mL Intramuscular Tomorrow-1000  . prasugrel  10 mg Oral Daily  . sodium chloride  3 mL Intravenous Q12H   Continuous Infusions:    Marinda ElkFELIZ ORTIZ, ABRAHAM  [facility] Pager [phone]. If 8PM-8AM, please contact night-coverage at [email or web address], password [email or web address] 07/21/2013, 10:12 AM  LOS: 1 day      **Disclaimer: This note may have been dictated with voice recognition software. Similar sounding words can inadvertently be transcribed and this note may contain transcription errors which may not have been corrected upon publication of note.**

## 2013-07-21 NOTE — Progress Notes (Signed)
Patient evaluated for community based chronic disease management services with [facility] as a benefit of patient's [employer]. Spoke with patient at bedside to explain [facility].  Services have been accepted with written consent.  Patient will receive a post discharge transition of care call and will be evaluated for monthly home visits for assessments and disease process education.  Pharmacy and SW assessment will be provided for medication and health expense cost assessments.  Left contact information and [facility] literature at bedside. Made Inpatient Case Manager aware that [facility] Care [facility] following. Of note, [facility] services does not replace or interfere with any services that are arranged by inpatient case management or social work.  For additional questions or referrals please contact Anibal Hendersonim Henderson BSN RN [facility] Hospital Liaison at [phone].
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
## 2013-07-21 NOTE — Evaluation (Signed)
Physical Therapy Evaluation Patient Details Name: [name] MRN: [id number] DOB: September 19, 1945 Today's Date: 07/21/2013   History of Present Illness  Pt is a 68 y/o male admitted with complaints of chest pain going on fro 2 weeks prior to admission.   Clinical Impression  Pt admitted with the above. Pt currently with functional limitations due to the deficits listed below (see PT Problem List). At the time of PT eval, pt was able to perform transfers and ~20 feet of ambulation with 2 standing rest breaks. Pt will benefit from skilled PT to increase their independence and safety with mobility to allow discharge to the venue listed below.     Follow Up Recommendations Home health PT;Supervision for mobility/OOB    Equipment Recommendations  None recommended by PT    Recommendations for Other Services       Precautions / Restrictions Precautions Precautions: Fall Restrictions Weight Bearing Restrictions: No      Mobility  Bed Mobility               General bed mobility comments: Pt sitting EOB when PT arrived. States he wants to continue sitting EOB when session is over.   Transfers Overall transfer level: Needs assistance Equipment used: Rolling walker (2 wheeled) Transfers: Sit to/from Stand Sit to Stand: From elevated surface;Supervision         General transfer comment: Pt rocks forward to gain momentum, and stands with a very wide stance.   Ambulation/Gait Ambulation/Gait assistance: Supervision Ambulation Distance (Feet): 20 Feet Assistive device: Rolling walker (2 wheeled) Gait Pattern/deviations: Decreased stride length;Trunk flexed;Wide base of support Gait velocity: Decreased Gait velocity interpretation: Below normal speed for age/gender General Gait Details: Pt taking 2 standing rest breaks with forearms on the walker. Complains of chest pain beginning after ambulating first 10 feet.   Stairs            Wheelchair Mobility    Modified Rankin  (Stroke Patients Only)       Balance Overall balance assessment: Needs assistance Sitting-balance support: Feet supported;No upper extremity supported Sitting balance-Leahy Scale: Good     Standing balance support: During functional activity;Bilateral upper extremity supported Standing balance-Leahy Scale: Poor                               Pertinent Vitals/Pain Vitals stable throughout session. Pt briefly complains of chest pain during ambulation.     Home Living Family/patient expects to be discharged to:: Private residence Living Arrangements: Alone Available Help at Discharge: Family;Available PRN/intermittently Type of Home: House Home Access: Ramped entrance     Home Layout: One level Home Equipment: Crutches;Walker - 2 wheels;Shower seat;Grab bars - tub/shower      Prior Function Level of Independence: Independent with assistive device(s)         Comments: Usually uses the crutches at home and in the community, uses the walker rarely. Still drives.     Hand Dominance   Dominant Hand: Right    Extremity/Trunk Assessment   Upper Extremity Assessment: Defer to OT evaluation           Lower Extremity Assessment: Generalized weakness;RLE deficits/detail;LLE deficits/detail RLE Deficits / Details: Pain with bending knees to sit, and pt states he is unable to straighten LE's all the way when he is standing due to knee pain.  LLE Deficits / Details: Pain with bending knees to sit, and pt states he is unable to straighten LE's  all the way when he is standing due to knee pain.   Cervical / Trunk Assessment: Normal  Communication   Communication: No difficulties  Cognition Arousal/Alertness: Awake/alert Behavior During Therapy: WFL for tasks assessed/performed Overall Cognitive Status: Within Functional Limits for tasks assessed                      General Comments      Exercises        Assessment/Plan    PT Assessment  Patient needs continued PT services  PT Diagnosis Difficulty walking;Generalized weakness   PT Problem List Decreased strength;Decreased range of motion;Decreased activity tolerance;Decreased balance;Decreased mobility;Decreased safety awareness;Decreased knowledge of use of DME;Cardiopulmonary status limiting activity  PT Treatment Interventions DME instruction;Gait training;Stair training;Functional mobility training;Therapeutic activities;Therapeutic exercise;Neuromuscular re-education;Patient/family education   PT Goals (Current goals can be found in the Care Plan section) Acute Rehab PT Goals Patient Stated Goal: Being independent with housework. PT Goal Formulation: With patient Time For Goal Achievement: 08/04/13 Potential to Achieve Goals: Good    Frequency Min 3X/week   Barriers to discharge Decreased caregiver support Pt states he isn't able to clean his home and cannot get anyone to do it for him. Son occasionally brings him meals.     Co-evaluation               End of Session   Activity Tolerance: Patient tolerated treatment well Patient left: with call bell/phone within reach;Other (comment) (Sitting EOB) Nurse Communication: Mobility status         Time: [id number] PT Time Calculation (min): 23 min   Charges:   PT Evaluation $Initial PT Evaluation Tier I: 1 Procedure PT Treatments $Therapeutic Activity: 8-22 mins   PT G Codes:          Ruthann CancerHamilton, Laura 07/21/2013, 4:55 PM  Ruthann CancerLaura Hamilton, PT, DPT Acute Rehabilitation Services Pager: [phone]

## 2013-07-22 DIAGNOSIS — I10 Essential (primary) hypertension: Secondary | ICD-10-CM

## 2013-07-22 DIAGNOSIS — Z794 Long term (current) use of insulin: Secondary | ICD-10-CM

## 2013-07-22 LAB — BASIC METABOLIC PANEL
BUN: 14 mg/dL (ref 6–23)
CALCIUM: 8.3 mg/dL — AB (ref 8.4–10.5)
CO2: 26 mEq/L (ref 19–32)
Chloride: 100 mEq/L (ref 96–112)
Creatinine, Ser: 0.73 mg/dL (ref 0.50–1.35)
Glucose, Bld: 182 mg/dL — ABNORMAL HIGH (ref 70–99)
POTASSIUM: 4.6 meq/L (ref 3.7–5.3)
Sodium: 141 mEq/L (ref 137–147)

## 2013-07-22 LAB — GLUCOSE, CAPILLARY
GLUCOSE-CAPILLARY: 247 mg/dL — AB (ref 70–99)
GLUCOSE-CAPILLARY: 260 mg/dL — AB (ref 70–99)
Glucose-Capillary: 173 mg/dL — ABNORMAL HIGH (ref 70–99)
Glucose-Capillary: 274 mg/dL — ABNORMAL HIGH (ref 70–99)

## 2013-07-22 MED ORDER — FUROSEMIDE 40 MG PO TABS: 40.0000 mg | ORAL_TABLET | Freq: Every day | ORAL | Status: AC

## 2013-07-22 MED ORDER — ATORVASTATIN CALCIUM 80 MG PO TABS: 80.0000 mg | ORAL_TABLET | Freq: Every day | ORAL | Status: AC

## 2013-07-22 MED ORDER — CLINDAMYCIN HCL 300 MG PO CAPS: 300.0000 mg | ORAL_CAPSULE | Freq: Three times a day (TID) | ORAL | Status: AC

## 2013-07-22 MED ORDER — ASPIRIN 81 MG PO CHEW: 81.0000 mg | CHEWABLE_TABLET | Freq: Every day | ORAL | Status: AC

## 2013-07-22 MED ORDER — METOPROLOL TARTRATE 12.5 MG HALF TABLET: 12.5000 mg | ORAL_TABLET | Freq: Two times a day (BID) | ORAL | Status: AC

## 2013-07-22 MED ORDER — NYSTATIN 100000 UNIT/GM EX POWD
Freq: Two times a day (BID) | CUTANEOUS | Status: DC
  Administered 2013-07-22: 10:00:00 via TOPICAL
  Filled 2013-07-22: qty 15

## 2013-07-22 NOTE — Plan of Care (Signed)
Problem: Phase III Progression Outcomes Goal: Activity at appropriate level-compared to baseline (UP IN CHAIR FOR HEMODIALYSIS)  Outcome: Adequate for Discharge At baseline.  Client ambulates to bathroom with walker.

## 2013-07-22 NOTE — Discharge Summary (Signed)
Physician Discharge Summary  [name] MVH:[id number] DOB: [birth year] DOA: 07/20/2013  PCP: Barbette ReichmannHANDE,VISHWANATH, MD  Admit date: 07/20/2013 Discharge date: 07/22/2013  Time spent: 35 minutes  Recommendations for Outpatient Follow-up:  1. Follow with PCP in 2 weeks.  Discharge Diagnoses:  Principal Problem:   Cellulitis Active Problems:   Coronary artery disease   Hypertension   Morbid obesity   DM (diabetes mellitus), type 2   History of medication noncompliance   Other chest pain   Unstable angina   Discharge Condition: stable  Diet recommendation: heart failure  Filed Weights   07/20/13 0806 07/21/13 0719 07/22/13 0615  Weight: 193.4 kg (426 lb 5.9 oz) 192.234 kg (423 lb 12.8 oz) 188.4 kg (415 lb 5.6 oz)    History of present illness:  68 y.o. male with past medical history of CHF, CAD, HTN and DM 2 with morbid obesity. Patient given to the hospital because of chest pain. Patient is a very poor historian, has history of medical non-adherence. York SpanielSaid last time he saw his primary care physician or his cardiologist was about a year ago. He came in today for chest pain being gone on for the past 2 weeks, he said usually when he lays down or walks he gets very sharp chest pain like someone hitting him with a baseball bat across his chest. He denies palpitations, denies radiation of the pain. When I evaluated him he was chest pain-free, patient has history of non-STEMI.   Hospital Course:  *Cellulitis:  - started him on IV clindamycin - afebrile, change clindamycin to oral.  - cont orally for 5 additional days. - pt evaluated the pt rec SNF. He only wants on specific SNF. He decided to go home and once a bed open in the SNF that he wants he will go.  Unstable angina  - cardiology consulted recommended cath, he refused.  - cont ASA, statins, metoprolol and effient   Chronic combined CHF  - He might even have biventricular CHF, last 2-D echo in May of 2014 showed ejection  fraction about 40-45%.  - Chart also he might have right-sided heart failure.  - changed to oral lasix once a day.  Diabetes mellitus type 2  -Start on carbohydrate modified diet, insulin sliding scale.  -hemoglobin A1 9.5c, start levemir.   Morbid obesity  -Counseled extensively about weight loss.     Procedures:  CXR  Consultations:  none  Discharge Exam: Filed Vitals:   07/22/13 0900  BP: 152/64  Pulse: 76  Temp: 98.4 F (36.9 C)  Resp: 24    General: A&Ox3 Cardiovascular: RRR Respiratory: good air movement CTA B/L  Discharge Instructions You were cared for by a hospitalist during your hospital stay. If you have any questions about your discharge medications or the care you received while you were in the hospital after you are discharged, you can call the unit and asked to speak with the hospitalist on call if the hospitalist that took care of you is not available. Once you are discharged, your primary care physician will handle any further medical issues. Please note that NO REFILLS for any discharge medications will be authorized once you are discharged, as it is imperative that you return to your primary care physician (or establish a relationship with a primary care physician if you do not have one) for your aftercare needs so that they can reassess your need for medications and monitor your lab values.  Discharge Instructions   Diet - low sodium heart  healthy    Complete by:  As directed      Increase activity slowly    Complete by:  As directed             Medication List         acetaminophen 325 MG tablet  Commonly known as:  TYLENOL  Take 2 tablets (650 mg total) by mouth every 4 (four) hours as needed for mild pain or fever.     aspirin 81 MG chewable tablet  Chew 1 tablet (81 mg total) by mouth daily.     atorvastatin 80 MG tablet  Commonly known as:  LIPITOR  Take 1 tablet (80 mg total) by mouth daily at 6 PM.     betamethasone valerate 0.1  % cream  Commonly known as:  VALISONE  Apply 1 application topically 2 (two) times daily.     clindamycin 300 MG capsule  Commonly known as:  CLEOCIN  Take 1 capsule (300 mg total) by mouth 3 (three) times daily.     furosemide 40 MG tablet  Commonly known as:  LASIX  Take 1 tablet (40 mg total) by mouth daily.     metoprolol tartrate 12.5 mg Tabs tablet  Commonly known as:  LOPRESSOR  Take 0.5 tablets (12.5 mg total) by mouth 2 (two) times daily.     nitroGLYCERIN 0.4 MG SL tablet  Commonly known as:  NITROSTAT  Place 1 tablet (0.4 mg total) under the tongue every 5 (five) minutes as needed for chest pain (up to 3 doses).     prasugrel 10 MG Tabs tablet  Commonly known as:  EFFIENT  Take 1 tablet (10 mg total) by mouth daily.       No Known Allergies     Follow-up Information   [facility] (hospital follow)    Specialty:  Internal Medicine   Contact information:   [facility] [address] [place] [address] [phone]        The results of significant diagnostics from this hospitalization (including imaging, microbiology, ancillary and laboratory) are listed below for reference.    Significant Diagnostic Studies: Dg Chest Port 1 View  07/20/2013   CLINICAL DATA:  Mid chest pain  EXAM: PORTABLE CHEST - 1 VIEW  COMPARISON:  01/30/2013  FINDINGS: Shallow inspiration. The heart size and mediastinal contours are within normal limits. Both lungs are clear. The visualized skeletal structures are unremarkable.  IMPRESSION: No active disease.   Electronically Signed   By: Burman NievesWilliam  Stevens M.D.   On: 07/20/2013 03:22    Microbiology: Recent Results (from the past 240 hour(s))  CULTURE, BLOOD (ROUTINE X 2)     Status: None   Collection Time    07/20/13  6:40 AM      Result Value Ref Range Status   Specimen Description BLOOD RIGHT ANTECUBITAL   Final   Special Requests BOTTLES DRAWN AEROBIC AND  ANAEROBIC 5CCS   Final   Culture  Setup Time     Final   Value: 07/20/2013 12:34     Performed at [facility]   Culture     Final   Value:        BLOOD CULTURE RECEIVED NO GROWTH TO DATE CULTURE WILL BE HELD FOR 5 DAYS BEFORE ISSUING A FINAL NEGATIVE REPORT     Performed at [facility]   Report Status PENDING   Incomplete  CULTURE, BLOOD (ROUTINE X 2)  Status: None   Collection Time    07/20/13  7:30 AM      Result Value Ref Range Status   Specimen Description BLOOD RIGHT ANTECUBITAL   Final   Special Requests BOTTLES DRAWN AEROBIC AND ANAEROBIC   Final   Culture  Setup Time     Final   Value: 07/20/2013 12:33     Performed at [facility]   Culture     Final   Value:        BLOOD CULTURE RECEIVED NO GROWTH TO DATE CULTURE WILL BE HELD FOR 5 DAYS BEFORE ISSUING A FINAL NEGATIVE REPORT     Performed at [facility]   Report Status PENDING   Incomplete  URINE CULTURE     Status: None   Collection Time    07/20/13  6:41 PM      Result Value Ref Range Status   Specimen Description URINE, CLEAN CATCH   Final   Special Requests NONE   Final   Culture  Setup Time     Final   Value: 07/20/2013 22:45     Performed at [facility] Count     Final   Value: 50,000 COLONIES/ML     Performed at [facility]   Culture     Final   Value: Multiple bacterial morphotypes present, none predominant. Suggest appropriate recollection if clinically indicated.     Performed at [facility]   Report Status 07/21/2013 FINAL   Final     Labs: Basic Metabolic Panel:  Recent Labs Lab 07/20/13 0400 07/21/13 0445 07/22/13 0500  NA 136* 134* 141  K 4.3 4.0 4.6  CL 97 96 100  CO2 25 25 26   GLUCOSE 290* 197* 182*  BUN 13 12 14   CREATININE 0.69 0.72 0.73  CALCIUM 8.9 8.4 8.3*   Liver Function Tests: No results found for this basename: AST, ALT, ALKPHOS, BILITOT, PROT, ALBUMIN,  in the last 168 hours No results found for  this basename: LIPASE, AMYLASE,  in the last 168 hours No results found for this basename: AMMONIA,  in the last 168 hours CBC:  Recent Labs Lab 07/20/13 0400 07/21/13 0445  WBC 20.6* 9.2  HGB 12.9* 11.7*  HCT 40.9 37.1*  MCV 81.3 81.2  PLT 230 200   Cardiac Enzymes:  Recent Labs Lab 07/20/13 1245 07/20/13 1810 07/20/13 2350  TROPONINI <0.30 <0.30 <0.30   BNP: BNP (last 3 results)  Recent Labs  01/30/13 0835 07/20/13 0400  PROBNP 3113.0* 1810.0*   CBG:  Recent Labs Lab 07/21/13 0645 07/21/13 1153 07/21/13 1613 07/21/13 2019 07/22/13 0614  GLUCAP 277* 172* 201* 229* 173*     Signed:  Marinda Elk  [facility] 07/22/2013, 10:51 AM

## 2013-07-23 LAB — GLUCOSE, CAPILLARY: GLUCOSE-CAPILLARY: 207 mg/dL — AB (ref 70–99)

## 2013-07-23 NOTE — Progress Notes (Signed)
Patient discharged home at 0840.  Son provided transportation.  Prescriptions were gone over and put in bag with discharge papers.  Verbalized understanding of discharge instructions.

## 2013-07-25 NOTE — Progress Notes (Signed)
CSW asked by MD to talk to patient about SNF placement. Patient related that he has been a resident [facility] in the past and would agree to return there but does not want to consider any other facility. He stated that he liked the facility and the care received. Patient has had a 60 day wellness period and should have a new Medicare benefit period.  CSW completed and Fl2 and sent referral [facility] per patient's request; spoke to admissions staff at facility. Currently they do not have any vacancies at facility but would keep referral on file. CSW discussed with patient who stated he would return home and plan d/c to SNF when bed comes available. CSW asked SNF to contact patient at home if a vacancy occurs.  Discussed above with Dr. Radonna RickerFeliz and will sign off.  Lorri Frederickonna T. West PughCrowder, LCSWA  [phone]

## 2013-07-26 LAB — CULTURE, BLOOD (ROUTINE X 2)
Culture: NO GROWTH
Culture: NO GROWTH

## 2014-01-04 ENCOUNTER — Encounter (HOSPITAL_COMMUNITY): Payer: Self-pay | Admitting: Cardiovascular Disease

## 2014-02-26 DEATH — deceased

## 2014-05-15 NOTE — Consult Note (Signed)
General Aspect 69 year old gentleman with morbid obesity, diabetes, hypertension, and coronary artery disease. He presented in June, 2012 with an acute myocardial infarction. He was found to have a ruptured plaque with severe stenosis in the left main and he underwent PCI utilizing a drug-eluting stent. He has been maintained on aspirin and effient. He has had no recurrent ischemic events. He presents with chest pain. Cardiology was consulted for symptoms.   he reports he has been doing well. Some medication noncompliance, poor diet. "my fault". He reports chest pain yesterday and today, both episodes occured while sitting in his car eating. No belching. Pain is now resolved on the floor, still with tenderness/same pain with palpation beneath his zyphoid in the center.  last seen in clinic 01/2011 by Dr. Legrand Como cooper. At that time with less SOB, no chest pain. His weight has been as high as 550 pounds, but he has been able to lose a significant amount of weight through diet and diuresis. The patient has been scheduled for followup on several occasions and he has missed all of his post hospital followup visits. He has a difficult time getting out of his house. The patient walks with crutches.   He denies orthopnea, PND, lightheadedness, or syncope.    Present Illness . cardiac cath 06/2010: weight was reportedly 475 pounds Left mainstem:  There was mild diffuse plaque and calcification in the proximal and mid left main.  The distal left main has an acentric thrombotic 90-95% stenosis just before the bifurcation of the LAD and left circumflex.   LAD:  The LAD is diffusely diseased.  It is a moderate-caliber vessel that supplies 3 diagonal branches.  The LAD has scattered 50-60% stenosis and the distal LAD shows total occlusion after a septal perforating branch.   Left circumflex:  There is a tiny intermediate branch.  There is also a tiny first OM branch.  The AV groove circumflex has 50-70%  stenosis in the midportion leading into an OM-2 branch.   Right coronary artery:  The RCA is large, dominant vessel that is diffusely diseased with 50-60% proximal stenosis, 50% mid stenosis, and diffuse distal plaquing.  The PDA is large with a 50% stenosis and there is a moderate-caliber posterolateral branch.  There is fairly heavy calcification throughout the course of the RCA.   FINAL ASSESSMENT: 1. 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 2. 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 3. 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   Physical Exam:   GEN well developed, well nourished, no acute distress, obese    HEENT moist oral mucosa    NECK supple  No masses    RESP normal resp effort  clear BS    CARD Regular rate and rhythm  No murmur    ABD denies tenderness  soft    LYMPH negative neck    EXTR negative edema    NEURO motor/sensory function intact    PSYCH alert, A+O to time, place, person, good insight   Review of Systems:   Subjective/Chief Complaint Chest pain, painful to palpation    General: No Complaints    Skin: No Complaints    ENT: No Complaints    Eyes: No Complaints    Neck: No Complaints  Respiratory: No Complaints    Cardiovascular: Chest pain or discomfort    Gastrointestinal: No Complaints    Genitourinary: No Complaints    Vascular: No Complaints    Musculoskeletal: No Complaints    Neurologic: No Complaints    Hematologic: No Complaints    Endocrine: No Complaints    Psychiatric: No Complaints    Review of Systems: All other systems were reviewed and found to be negative    Medications/Allergies Reviewed Medications/Allergies reviewed     Myocardial Infarct:    cataract  surgery:    left knee arthroscopy:    Morbid obesity:    Cellilities:    high cholesterol:    DM:    htn:    Cardiac Stent:    right foot skin graft:        Admit Diagnosis:   CHEST PAIN: 13-Nov-2011, Active, CHEST PAIN  Home Medications: Medication Instructions Status  gemfibrozil 600 mg oral tablet 1 tab(s) orally 2 times a day Active  glyBURIDE-metformin 2.5 mg-500 mg oral tablet 2 tab(s) orally 2 times a day Active  Lasix 40 mg oral tablet 1 tab(s) orally once a day Active  Humulin N 100 units/mL subcutaneous suspension 40 unit(s) subcutaneous 2 times a day Active  aspirin 325 mg oral tablet 1 tab(s) orally once a day Active  Effient 10 mg oral tablet 1 tab(s) orally once a day Active   Lab Results:  Routine Chem:  18-Oct-13 12:05    Result Comment troponin - RESULTS VERIFIED BY REPEAT TESTING.  - C/ LINDSEY MILLER _0  11-13-11. [facility]  - READ-BACK PROCESS PERFORMED.  Result(s) reported on 13 Nov 2011 at 01:10PM.   B-Type Natriuretic Peptide [facility])  357 (Result(s) reported on 13 Nov 2011 at 02:44PM.)   Glucose, Serum  289   BUN 7   Creatinine (comp) 0.83   Sodium, Serum 139   Potassium, Serum 4.2   Chloride, Serum 104   CO2, Serum 28   Calcium (Total), Serum 8.9   Anion Gap 7   Osmolality (calc) 286   eGFR (African American) >60   eGFR (Non-African American) >60 (eGFR values <62m/min/1.73 m2 may be an indication of chronic kidney disease (CKD). Calculated eGFR is useful in patients with stable renal function. The eGFR calculation will not be reliable in acutely ill patients when serum creatinine is changing rapidly. It is not useful in  patients on dialysis. The eGFR calculation may not be applicable to patients at the low and high extremes of body sizes, pregnant women, and vegetarians.)    16:03    Result Comment TROPONIN - RESULTS VERIFIED BY REPEAT TESTING.  - RESULT PREVIOUSLY CALLED AT 1310  - 11/13/11 BY [facility]  Result(s) reported on 13 Nov 2011 at 05:10PM.  Cardiac:  18-Oct-13 12:05    CK, Total 47   CPK-MB, Serum 0.8 (Result(s) reported on 13 Nov 2011 at 01:07PM.)   Troponin I  0.12 (0.00-0.05 0.05 ng/mL or less: NEGATIVE  Repeat testing in 3-6 hrs  if clinically indicated. >0.05 ng/mL: POTENTIAL  MYOCARDIAL INJURY. Repeat  testing in 3-6 hrs if  clinically indicated. NOTE: An increase or decrease  of 30% or more on serial  testing suggests a  clinically important change)    16:03    CK, Total 57   CPK-MB, Serum  < 0.5 (Result(s) reported on 13 Nov 2011 at 04:59PM.)   Troponin I  0.14 (0.00-0.05 0.05 ng/mL or less: NEGATIVE  Repeat testing in 3-6 hrs  if clinically  indicated. >0.05 ng/mL: POTENTIAL  MYOCARDIAL INJURY. Repeat  testing in 3-6 hrs if  clinically indicated. NOTE: An increase or decrease  of 30% or more on serial  testing suggests a  clinically important change)  Routine Coag:  18-Oct-13 12:05    Prothrombin 13.4   INR 1.0 (INR reference interval applies to patients on anticoagulant therapy. A single INR therapeutic range for coumarins is not optimal for all indications; however, the suggested range for most indications is 2.0 - 3.0. Exceptions to the INR Reference Range may include: Prosthetic heart valves, acute myocardial infarction, prevention of myocardial infarction, and combinations of aspirin and anticoagulant. The need for a higher or lower target INR must be assessed individually. Reference: The Pharmacology and Management of the Vitamin K  antagonists: the seventh ACCP Conference on Antithrombotic and Thrombolytic Therapy. BSJGG.8366 Sept:126 (3suppl): [id number]. A HCT value >55% may artifactually increase the PT.  In one study,  the increase was an average of 25%. Reference:  "Effect on Routine and Special Coagulation Testing Values of Citrate Anticoagulant Adjustment in Patients with High HCT Values." American Journal of Clinical Pathology 2006;126:400-405.)   Activated PTT  (APTT) 28.3 (A HCT value >55% may artifactually increase the APTT. In one study, the increase was an average of 19%. Reference: "Effect on Routine and Special Coagulation Testing Values of Citrate Anticoagulant Adjustment in Patients with High HCT Values." American Journal of Clinical Pathology 2006;126:400-405.)  Routine Hem:  18-Oct-13 12:05    WBC (CBC) 10.6   RBC (CBC) 5.90   Hemoglobin (CBC) 15.6   Hematocrit (CBC) 47.4   Platelet Count (CBC) 285 (Result(s) reported on 13 Nov 2011 at 12:38PM.)   MCV 80   MCH 26.4   MCHC 32.9   RDW  14.7   EKG:   Interpretation EKG shows NSR with no significant ST or T wave changes, poor R wave progression through the anterior precordial leads    No Known Allergies:   Vital Signs/Nurse's Notes: **Vital Signs.:   18-Oct-13 17:52   Vital Signs Type Admission   Temperature Temperature (F) 98.2   Celsius 36.7   Temperature Source oral   Pulse Pulse 83   Respirations Respirations 20   Systolic BP Systolic BP 294   Diastolic BP (mmHg) Diastolic BP (mmHg) 78   Mean BP 99   Pulse Ox % Pulse Ox % 98   Pulse Ox Activity Level  At rest   Oxygen Delivery Room Air/ 21 %     Impression 69 year old gentleman with morbid obesity, diabetes, hypertension, and coronary artery disease. He presented in June 2012 with an acute myocardial infarction. He was found to have a ruptured plaque with severe stenosis in the left main and he underwent PCI utilizing a drug-eluting stent. He has been maintained on aspirin and effient.  He presents with chest pain while eating.  1) Chest pain Atypical presentation, Happened twice while sitting in his car eating. Different sx from previous MI Pain on palpation, possible hiatal hernia, unable to exclude musculoskeletal issue --on heparin initial cardiac enz negative --Would continue rule out cardiac enz x 3 --If negative, no further testing needed given atypical presentation.  2) Obesity: not very successful with  diet. Noncompliant with diuretic  3) Diabetes: insulin very expensive, "almost out" costs $300  4) Hyperlipidemia "running out", needs a refill  5) CAD, s/p left main stent Continue asa, effient I have suggested he follow up with Dr. Copper in [place]  6) chronic diastolic CHF Suggested he continue  lasix, take extra lasix for edema, weight gain Diet is poor   Electronic Signatures: Ida Rogue (MD)  (Signed 18-Oct-13 19:03)  Authored: General Aspect/Present Illness, History and Physical Exam, Review of System, Past Medical History, Health Issues, Home Medications, Labs, EKG , Allergies, Vital Signs/Nurse's Notes, Impression/Plan   Last Updated: 18-Oct-13 19:03 by Ida Rogue (MD)

## 2014-05-15 NOTE — H&P (Signed)
PATIENT NAME:  Craig DuffelRIM, [name] DATE OF BIRTH:  1945/12/29  DATE OF ADMISSION:  11/13/2011  PRIMARY CARE PHYSICIAN:  has been seen by [facility] at [facility].  ED REFERRING PHYSICIAN: Dr. Clemens Catholicagsdale.   CHIEF COMPLAINT: Chest pain.   HISTORY OF PRESENT ILLNESS: The patient is a morbidly obese 69 year old white male with history of stent a year ago who states that yesterday he was driving his car and developed a sharp substernal pain in the lower sternal region. It lasted for about a hour to two hours. Resolved on its own and then today he had the same thing happen while he was driving. He continued to have the symptoms and now he is asymptomatic. He was noted to have a slightly elevated troponin. The ED physician spoke to Dr. Mariah MillingGollan who recommended admitting the patient and starting him on heparin and he will evaluate to see if he needs any further intervention. The patient otherwise also complains of having productive cough for the past three weeks of yellow sputum. Has not felt feverish. Has been occasionally wheezing. He also has chronic severe pain involving his knee. Therefore, he is unable to work. There is also a question of sleep apnea which he has not been worked up yet.   PAST MEDICAL HISTORY:  1. Coronary artery disease with a stent about a year ago. Details are not known at [facility].  2. Morbid obesity.  3. History of cellulitis.  4. Hyperlipidemia.  5. Diabetes type 2.  6. Hypertension.   PAST SURGICAL HISTORY:  1. Bilateral cataract surgery.  2. Left knee arthroscopy. 3. Right foot skin graft.   ALLERGIES: None.   CURRENT MEDICATIONS: Aspirin 325 mg p.o. daily. Effient 10 daily. Gemfibrozil 600 one abs p.o. b.i.d., glyburide/metformin 2.5/500, 2 tabs 2 times per day, Humulin N 40 units subcutaneous b.i.d. Lasix 40 daily.   SOCIAL HISTORY: Does not smoke. Does not drink. No drugs. Currently disabled due to knee problems.   FAMILY HISTORY: Father died at  age 68 from a heart attack.   REVIEW OF SYSTEMS.   CONSTITUTIONAL: Denies any fevers. Complains of fatigue, weakness. Has chronic knee pain. No weight loss. No weight gain.   EYES: No blurred or double vision. No pain. No redness. History of cataracts.   ENT: No tinnitus. No ear pain. No hearing loss. No seasonal or year-round allergies. No difficulty swallowing.   RESPIRATORY: Does have cough for three weeks productive, occasional wheezing. No hemoptysis. No diagnosis of chronic obstructive pulmonary disease or tuberculosis.   CARDIOVASCULAR: Complains of chest pressure as above. He denies any edema. No palpitations.    GASTROINTESTINAL: Denies any nausea, vomiting, diarrhea. No abdominal pain. No hematemesis. No melena. No ulcers. No irritable bowel syndrome. No jaundice, no rectal bleeding.   GENITOURINARY: Denies any dysuria, hematuria, renal colic or frequency.   ENDO: Denies any polyuria, nocturia, or thyroid problems.   HEME/LYMPH: Denies any major bruisability or bleeding.   SKIN: No acne. No rash. No changes in mole, hair or skin.   MUSCULOSKELETAL: Has severe knee pain.   NEUROLOGICAL: No numbness. No cerebrovascular accident. No transient ischemic attack. No seizures.   PSYCHIATRIC: No anxiety. No insomnia. No ADD. No OCD.  PHYSICAL EXAMINATION:  VITAL SIGNS:  Temperature 97.7, pulse 83, respirations 18, blood pressure 139/67, O2 96%.   GENERAL: Leave the patient is a morbidly obese white male currently not in any acute distress.   HEENT: Head atraumatic, normocephalic. Pupils equally round, reactive  to light and accommodation. There is no conjunctival pallor. No scleral icterus. Nasal exam shows no drainage or ulceration.   OROPHARYNX: Clear without any exudate.   NECK: No thyromegaly. No carotid bruits.   CARDIOVASCULAR: Regular rate and rhythm. No murmurs, rubs, clicks, or gallops. PMI is not displaced.   LUNGS: Clear to auscultation bilaterally without any  rales, rhonchi, or wheezing.   ABDOMEN: Soft, nontender, nondistended. Positive bowel sounds x4.   EXTREMITIES: No clubbing, cyanosis. He has nonpitting edema.  NEUROLOGICAL: Awake, alert, oriented x3. No focal deficits.   PSYCHIATRIC: Not anxious or depressed.   LYMPHATICS: No lymph nodes palpable.  LABORATORY, DIAGNOSTIC, AND RADIOLOGICAL DATA. BNP was 357. INR 1.0. Troponin 0.12. Glucose 289, BUN 7, creatinine 0.83, sodium 139, potassium 4.2, chloride 104, CO2 28, PA and lateral chest x-ray showed no acute cardiopulmonary processes. EKG showed normal sinus rhythm without any ST-T wave changes.   ASSESSMENT AND PLAN: The patient is a 69 year old white male with morbid obesity, coronary artery disease, hyperlipidemia, diabetes, hypertension presents with chest pain, borderline troponin . 1. Chest pain with borderline troponin, concern for possible angina. Does have history of coronary artery disease, at this time will just check serial cardiac enzymes. Dr. Mariah Milling to see. Continue heparin drip as been written. We will do nitroglycerin p.r.n., continue Effient as taking at home as well as aspirin. Will add beta blocker.  2. Acute bronchitis, will place him on p.o. Levaquin.  3. Diabetes. We will continue his NPH and oral regimen, will check a hemoglobin A1c.  4. Hypertension, not on any antihypertensives except some Lasix. We will add a low dose beta blocker.  5. Hyperlipidemia. We will continue gemfibrozil, check a fasting lipid panel in the a.m. 6. Miscellaneous. The patient will be on heparin drip.  Note:  35 minutes spent.  ____________________________ Lacie Scotts. Allena Katz, MD shp:ljs D: 11/13/2011 17:17:16 ET T: 11/14/2011 06:52:08 ET JOB#: [id number]  cc: Michail Boyte H. Allena Katz, MD, <Dictator> Charise Carwin MD ELECTRONICALLY SIGNED 11/22/2011 15:29

## 2014-05-15 NOTE — Discharge Summary (Signed)
PATIENT NAME:  [name], [name] MR#:  [id number] DATE OF BIRTH:  Jan 25, 1946  DATE OF ADMISSION:  11/13/2011 DATE OF DISCHARGE:  11/15/2011  REASON FOR ADMISSION: Chest pain.   HISTORY OF PRESENT ILLNESS: Please see the dictated history of present illness done by Dr. Auburn BilberryShreyang Patel on 11/13/2011.   PAST MEDICAL HISTORY:  1. Morbid obesity.  2. Atherosclerotic cardiovascular disease status post percutaneous transluminal coronary angioplasty with stent placement.  3. Type II diabetes.  4. Hyperlipidemia.  5. Benign hypertension.  6. History of cellulitis.  7. Status post cataract surgery.   MEDICATIONS ON ADMISSION: Please see admission note.   ALLERGIES: No known drug allergies.   SOCIAL HISTORY, FAMILY HISTORY, AND REVIEW OF SYSTEMS: As per admission note.   PHYSICAL EXAMINATION: The patient was in no acute distress. Vital signs were stable and he was afebrile. HEENT exam was unremarkable. NECK was supple without JVD. LUNGS were clear. CARDIAC exam revealed a regular rate and rhythm. Normal S1 and S2. ABDOMEN soft and nontender. EXTREMITIES revealed 1+ edema. NEUROLOGIC exam was grossly nonfocal.   HOSPITAL COURSE: The patient was admitted with atypical chest pain. His troponin was mildly elevated but nondiagnostic. His chest x-ray was unremarkable. The patient was seen in consultation by Cardiology who felt that his chest pain was noncardiac in nature. His troponins were trended. Maximum troponin was 0.24. It subsequently declined down to 0.17. Cardiology follow-up again agreed that the chest pain was noncardiac and no cardiac testing was felt necessary. The patient did have some substernal chest pain to palpation. He also complained of congestion. He was treated for presumed bronchitis. He remained stable without complications. By 11/15/2011, the patient was stable and ready for discharge.   DISCHARGE DIAGNOSES:  1. Noncardiac chest pain.  2. Costochondritis.  3. Bronchitis.  4. Morbid  obesity.  5. Type II diabetes.  6. Benign hypertension.  7. Hyperlipidemia.  8. Atherosclerotic cardiovascular disease status post percutaneous transluminal coronary angioplasty with stent placement.   DISCHARGE MEDICATIONS:  1. Aspirin 325 mg p.o. daily.  2. Lopid 600 mg p.o. b.i.d.  3. Glyburide/metformin 2.5/500 two tablets p.o. b.i.d.  4. Lasix 40 mg p.o. daily.  5. Humulin N 40 units sub-Q b.i.d.  6. Effient 10 mg p.o. daily.  7. Lopressor 12.5 mg p.o. b.i.d.  8. Nitrostat p.r.n. chest pain.  9. Levaquin 500 mg p.o. daily x1 week.  10. Nystatin powder apply to rash t.i.d.   FOLLOW-UP PLANS AND APPOINTMENTS:  1. The patient was discharged on a low sodium carbohydrate controlled diet.  2. He will follow-up with Dr. Marcello FennelHande within one week's time, sooner if needed.   ____________________________ Duane LopeJeffrey D. Judithann SheenSparks, MD jds:drc D: 11/15/2011 07:56:30 ET T: 11/16/2011 10:34:02 ET JOB#: [id number]  cc: Duane LopeJeffrey D. Judithann SheenSparks, MD, <Dictator> Mazel Villela Rodena Medin Rudransh Bellanca MD ELECTRONICALLY SIGNED 11/16/2011 17:02

## 2014-05-18 NOTE — Discharge Summary (Signed)
PATIENT NAME:  [name], [name] MR#:  [id number] DATE OF BIRTH:  04/14/1945  DATE OF ADMISSION:  03/20/2012 DATE OF DISCHARGE:  03/24/2012  DIAGNOSES AT TIME OF DISCHARGE: 1.  Acute non-ST-elevation myocardial infarction.  2.  Morbid obesity.  3.  Type 2 diabetes.  4.  Diabetic neuropathy.  5.  Hyperlipidemia.  6.  Depression.  7.  Hypertension.   CHIEF COMPLAINT:  Epigastric pain.   HISTORY OF PRESENT ILLNESS:  The patient is a 69 year old male who presented to the ER complaining of upper abdominal pain.  The patient states that he had actually fallen at home.  He states that he woke up around 6:00 in the morning and initially experienced substernal/epigastric burning sensation associated with nausea, vomiting and ambulated to the bathroom and states that afterwards his knee gave out and he fell and struck his head.  The patient has a history of coronary artery disease and had presented with an MI in June of 2012 when he was found to have a ruptured plaque with severe stenosis of the left main and underwent PCI using drug-eluting stent.  He had been placed on aspirin and Effient, however patient reportedly had not been taking his medications, has also been more depressed lately and states that he has not kept his home very clean.  The patient also has lost a significant amount of weight and he weighed about 550 pounds about two years back and since then has lost close to 200 pounds.  A cardiac cath done in June of 2012 showed diffuse plaque in the left mainstem and 90% to 95% stenosis of the distal left main before the bifurcation of the LAD and left circumflex.  The LAD was also diffusely diseased and left circumflex was a tiny intermediate branch with a tiny first OM branch with an AV groove, circumflex had a 50% to 75% stenosis.  Right coronary artery was a dominant vessel that was diseased 50% to 60% with proximal stenosis.   PHYSICAL EXAMINATION: GENERAL:  The patient was morbidly obese, not in  distress.  VITAL SIGNS:  Blood pressure 141/60, heart rate 87, respirations 22, he was afebrile.  HEENT:  NCAT.  HEART:  S1, S2.  OROPHARYNX:  Clear.  LUNGS:  Clear to auscultation.  ABDOMEN:  Soft.  Evidence of tenderness in the epigastric area noted.  EXTREMITIES:  3+ edema with venous stasis.  NEUROLOGIC:  Nonfocal.   LABORATORY DATA:  CT of the abdomen was nondiagnostic except for renal cysts.  Lipase was 73, total CK was 126, MB was 7.9 and troponin I 0.9, glucose 254 with a BUN of 10, creatinine 0.86.  WBC count of 21.7, hemoglobin of 14.4.    HOSPITAL COURSE:  The patient was admitted to [facility].  His troponins did trend down and was 2.7 and 2.0 respectively.  He was seen by cardiologist, Dr. Mariah Milling and the patient however declined cardiac catheterization and preferred to be treated medically.  During his stay in the hospital he did not have any further episodes of chest pain.  He was started on Zoloft for depression and was also seen by physical therapy.  He was offered rehab, but he declined, preferred to go home.  Physical therapy was arranged as an outpatient.  His Effient was subsequently switched to Plavix and he was discharged in stable condition on the following medications.   DISCHARGE MEDICATIONS:  Plavix 75 mg once a day, Levaquin 500 mg once a day for five more days, Lantus insulin 30  units subQ at bedtime, glyburide 5 mg twice a day, sertraline 50 mg once a day, isosorbide mononitrate 30 mg once a day, losartan 25 mg once a day, Nitrostat 0.4 mg sublingual as needed, Lopressor 12.5 mg twice daily, aspirin 325 mg a day, Lasix 40 mg a day, Gemfibrozil 600 mg by mouth twice daily, and nystatin powder to be applied topically three days a week to his rash.   FOLLOW-UP:  The patient has been advised to follow up with me, Dr. Marcello FennelHande in 1 to 2 weeks.  Also advised to follow up with Dr. Mariah MillingGollan in 1 to 2 weeks' time.  Advised to return to ER if he has worsening chest pain at any time.  The  patient was stable at the time of discharge.    Total time spent in discharge and co ordination of care; 40 minutes    ____________________________ Barbette ReichmannVishwanath Ezekiah Massie, MD vh:ea D: 03/25/2012 13:21:26 ET T: 03/26/2012 02:47:56 ET JOB#: [id number]  cc: Barbette ReichmannVishwanath TRUE Shackleford, MD, <Dictator> Barbette ReichmannVISHWANATH Belva Koziel MD ELECTRONICALLY SIGNED 04/12/2012 17:25

## 2014-05-18 NOTE — H&P (Signed)
PATIENT NAME:  [name], Seydou R MR#:  [id number] DATE OF BIRTH:  01-22-1946  DATE OF ADMISSION:  05/07/2012  PRIMARY CARE PHYSICIAN: Barbette ReichmannVishwanath Hande, MD  REFERRING PHYSICIAN: Maricela BoLuna Ragsdale, MD  CHIEF COMPLAINT: Chest pain since yesterday.   HISTORY OF PRESENT ILLNESS: The patient is a 69 year old Caucasian male with a history of hypertension, diabetes, MI 2 months ago, obesity, hyperlipidemia, who presented to the ED with chest pain since yesterday. The patient is alert, awake, oriented, in no acute distress. The patient said he has had chest pain since yesterday, which is dull, substernal, no radiation, 5 out of 10 without any palpitations, orthopnea or nocturnal dyspnea. The patient denies any cough, sputum, shortness of breath. No sweating. The patient has not taken any medication for 8 months, even though the patient had MI 2 months ago. Dr. Mariah MillingGollan suggested to patient cardiac catheterization, but the patient declined. The patient is supposed to take aspirin, Plavix, statin and hypertension medication, but he did not take any medication due to insurance issue, economy problem.   PAST MEDICAL HISTORY: As mentioned above, hypertension, diabetes, heart attack, obesity, hyperlipidemia.   PAST SURGICAL HISTORY: Cataract surgery. ASCVD, status post MI with PTCA and stent placement.   SOCIAL HISTORY: No smoking, drinking or illicit drugs.   FAMILY HISTORY: Hypertension, diabetes, heart attack and stroke in his family.   ALLERGIES: No.   HOME MEDICATIONS: No.  REVIEW OF SYSTEMS:    CONSTITUTIONAL: The patient denies any fever or chills. No headache or dizziness. No weakness.  EYES: No double vision, blurry vision.  EARS, NOSE, THROAT: No postnasal drip, slurred speech or dysphagia.  CARDIOVASCULAR: Positive for chest pain but no palpitations, orthopnea or nocturnal dyspnea. No leg edema.  PULMONARY: No cough, sputum, shortness of breath or hemoptysis. GASTROINTESTINAL: No abdominal pain, nausea,  vomiting or diarrhea. No melena or bloody stool. GENITOURINARY: No dysuria, hematuria or incontinence.  SKIN: No rash or jaundice.  NEUROLOGIC: No syncope, loss of consciousness or seizure. PSYCHIATRIC: No stress, depression or anxiety.   PHYSICAL EXAMINATION:  VITAL SIGNS: Blood pressure 163/77, pulse 84, respirations 22, oxygen saturation 97% on room air.  GENERAL: The patient is alert, awake, oriented, in no acute distress.  HEENT: Pupils round, equal, reactive to light and accommodation. Moist oral mucosa. Clear oropharynx.  NECK: Supple. No JVD or carotid bruits. No lymphadenopathy. No thyromegaly.  CARDIOVASCULAR: S1, S2, regular rate, rhythm. No murmurs or gallops.  PULMONARY: Bilateral air entry. No wheezing, rales. No use of accessory muscles to breathe.  ABDOMEN: Obese, soft. No distention. No tenderness. No organomegaly. Bowel sounds present.  EXTREMITIES: No edema, clubbing or cyanosis. No calf tenderness. Bilateral pedal pulses present.  SKIN: No rash or jaundice.  NEUROLOGIC: Alert and oriented x 3. No focal deficits. Power 5/5. Sensation intact.   LABORATORY DATA: CBC normal. Glucose 342, BUN 12, creatinine 0.79, electrolytes normal. Troponin 0.04, CK 57, CK-MB 1.3. EKG showed no ST elevation or depression at 73 BPM.   IMPRESSION:  1.  Chest pain, possible unstable angina.  2.  Coronary artery disease.  3.  Hypertension.  4.  Diabetes.  5.  Obesity.  6.  Hyperlipidemia.   PLAN OF TREATMENT:  1.  The patient will be admitted to telemetry floor. We will continue telemonitor, O2 by nasal cannula. Will continue Lovenox 1 mg/kg q.12 hours. Give aspirin, Plavix, losartan, Lopressor and statin. Morphine p.r.n. for chest pain. In addition, we will follow up the troponin level, get a cardiology consult from Dr. Mariah MillingGollan  for possible cardiac cath.  2.  For diabetes, we will start sliding scale, continue Lantus. The patient was supposed to get Lantus 30 units at bedtime. Check  hemoglobin A1c, check lipid panel.  3.  GI and DVT prophylaxis.  4.  I discussed the patient's condition and plan of treatment with the patient and the patient's son.  CODE STATUS: The patient is full code.   TIME SPENT: About 56 minutes.   ____________________________ Shaune Pollack, MD qc:jm D: 05/07/2012 16:12:33 ET T: 05/07/2012 17:21:40 ET JOB#: [id number]  cc: Shaune Pollack, MD, <Dictator> Shaune Pollack MD ELECTRONICALLY SIGNED 05/08/2012 15:35

## 2014-05-18 NOTE — Consult Note (Signed)
General Aspect 69 year old gentleman with morbid obesity, diabetes, hypertension, diastolic dysfunction and coronary artery disease. He presented in June, 2012 with an acute myocardial infarction. He was found to have a ruptured plaque with severe stenosis in the left main and he underwent PCI utilizing a drug-eluting stent. He had diffuse residual, nonobstructive disease elsewhere (see below). He has been maintained on aspirin and Effient. He presents after a fall from bed yesterday Am, subsequent chest pain.Cardiology has subsequently been consulted.  He states that he has not taken any of his medications for the past 6 months. He acquires many meds for his wife who is in a nursing home, however states that affording Effient with his insurance "should not be a problem." He is admittedly depressed, which he also attributes to lack of medication compliance. He has not followed up during this time. He has been in his USOH until yesterday morning around 6 AM when he experienced low substernal/epigastric burning without radiation, worse on laying supine, with associated weakness, nausea and vomiting. He ambulated to the bathroom, his knees gave out, he fell and actually struck his head. His pain continued thus prompting his ED presentation.   Last seen in clinic 01/2011 by Dr. Burt Knack. At that time with less SOB, no chest pain. His weight has been as high as 550 pounds, but he has been able to lose a significant amount of weight through diet and diuresis. The patient has been scheduled for followup on several occasions and he has missed all of his post hospital followup visits. He has a difficult time getting out of his house. The patient walks with crutches.  EKG there revealed no acute ischemic changes. Abdominal/head/cervical CT revealed no acute changes. Initial troponin did return elevated. He was admitted by the medicine service, continued on ASA/Effient and started on Lovenox.   2D echo 07/14/10: EF 55%,  mild LVH, mild LAE, grade 2 diast dysfxn, Ao root 38 mm   Present Illness . Cardiac cath 06/2010: weight was reportedly 475 pounds Left mainstem:  There was mild diffuse plaque and calcification in the proximal and mid left main.  The distal left main has an acentric thrombotic 90-95% stenosis just before the bifurcation of the LAD and left circumflex.   LAD:  The LAD is diffusely diseased.  It is a moderate-caliber vessel that supplies 3 diagonal branches. The LAD has scattered 50-60% stenosis and the distal LAD shows total occlusion after a septal perforating branch.   Left circumflex:  There is a tiny intermediate branch.  There is also a tiny first OM branch.  The AV groove circumflex has 50-70% stenosis in the midportion leading into an OM-2 branch.   Right coronary artery:  The RCA is large, dominant vessel that is diffusely diseased with 50-60% proximal stenosis, 50% mid stenosis, and diffuse distal plaquing.  The PDA is large with a 50% stenosis and there is a moderate-caliber posterolateral branch.  There is fairly heavy calcification throughout the course of the RCA.   FINAL ASSESSMENT: 1. 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 2. 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 3. 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   SOCIAL HISTORY: The patient denies alcohol or tobacco abuse. Lives alone.   FAMILY HISTORY: Positive for coronary  artery disease, diabetes, hypertension and stroke. Negative for prostate or colon cancer.   Physical Exam:  GEN well developed, well nourished, no acute distress, obese   HEENT moist oral mucosa   NECK supple  No masses   RESP normal resp effort  clear BS   CARD Regular rate and rhythm  Normal,  S1, S2  No murmur   ABD denies tenderness  soft   LYMPH negative neck   EXTR negative edema   NEURO motor/sensory function intact   PSYCH alert, A+O to time, place, person, poor insight, depressed   Review of Systems:  Subjective/Chief Complaint epigastric pain   General: weakness   Skin: No Complaints   ENT: No Complaints   Eyes: No Complaints   Neck: No Complaints   Respiratory: No Complaints   Cardiovascular: Chest pain or discomfort   Gastrointestinal: Heartburn  Nausea   Psychiatric: Depression   Review of Systems: All other systems were reviewed and found to be negative     Myocardial Infarct:    cataract surgery:    left knee arthroscopy:    Morbid obesity:    Cellilities:    high cholesterol:    DM:    htn:    Cardiac Stent:    right foot skin graft:        Admit Diagnosis:   ACUTE MI: Onset Date: 21-Mar-2012, Status: Active, Description: ACUTE MI      Admit Reason:   Diabetes (250.00): Status: Active, Coding System: ICD9, Coded Name: Type II or unspecified type diabetes mellitus without mention of complication, not stated as uncontrolled   Weakness (780.79): Status: Active, Coding System: ICD9, Coded Name: Other malaise and fatigue   Abdominal pain (789.00): Status: Active, Coding System: ICD9, Coded Name: Abdominal pain, unspecified site   AMI (acute myocardial infarction) (410.90): Status: Active, Coding System: ICD9, Coded Name: Acute myocardial infarction, unspecified site, episode of care unspecified  Home Medications: Medication Instructions Status  gemfibrozil 600 mg oral tablet 1 tab(s) orally 2 times a day Active  glyBURIDE-metformin 2.5 mg-500 mg oral tablet 2 tab(s) orally 2 times a day Active  Lasix 40 mg oral tablet 1 tab(s) orally once a day Active  Humulin N 100 units/mL subcutaneous suspension 40 unit(s) subcutaneous 2 times a day Active  aspirin 325 mg oral tablet 1 tab(s) orally once a day Active  Effient 10 mg  oral tablet 1 tab(s) orally once a day Active  Lopressor 25 milligram(s) orally 1/2 tab 2 times a day Active  Nitrostat 0.4 milligram(s) sublingual , As Needed- for Chest Pain  Active  nystatin 100,000 units/g topical powder Apply topically to affected area 3 times a dayto rash Active   Lab Results:  Hepatic:  23-Feb-14 14:52   Bilirubin, Total 0.9  Alkaline Phosphatase 106  SGPT (ALT) 33  SGOT (AST)  38  Total Protein, Serum 6.6  Albumin, Serum  2.8  Routine Chem:  23-Feb-14 14:52   Result Comment TROPONIN - RESULTS VERIFIED BY REPEAT TESTING.  - HP TO JEAN ADAM @ 5956 ON 03/20/12  - READ-BACK PROCESS PERFORMED.  Result(s) reported on 20 Mar 2012 at 05:49PM.  Glucose, Serum  254  BUN 10  Creatinine (comp) 0.86  Sodium, Serum 136  Potassium, Serum 3.6  Chloride, Serum 104  CO2, Serum 27  Calcium (Total), Serum  8.1  Anion Gap  5  Osmolality (calc) 280  eGFR (African American) >60  eGFR (Non-African American) >60 (eGFR values <25mL/min/1.73 m2 may be an indication of  chronic kidney disease (CKD). Calculated eGFR is useful in patients with stable renal function. The eGFR calculation will not be reliable in acutely ill patients when serum creatinine is changing rapidly. It is not useful in  patients on dialysis. The eGFR calculation may not be applicable to patients at the low and high extremes of body sizes, pregnant women, and vegetarians.)  Lipase 73 (Result(s) reported on 20 Mar 2012 at 03:23PM.)  24-Feb-14 06:26   Result Comment TROPONIN - RESULTS VERIFIED BY REPEAT TESTING.  - RESULT CALLED PREVIOUSLY 03/20/12 AT 1749  - BY [facility]  Result(s) reported on 21 Mar 2012 at 07:29AM.  Cholesterol, Serum 118  Triglycerides, Serum 129  HDL (INHOUSE)  28  VLDL Cholesterol Calculated 26  LDL Cholesterol Calculated 64 (Result(s) reported on 21 Mar 2012 at 07:12AM.)  Glucose, Serum  161  BUN 7  Creatinine (comp) 0.60  Sodium, Serum 136  Potassium, Serum 3.5  Chloride,  Serum 104  CO2, Serum 26  Calcium (Total), Serum  8.2  Anion Gap  6 (Result(s) reported on 21 Mar 2012 at 07:26AM.)  Osmolality (calc) 273  Cardiac:  23-Feb-14 14:52   CK, Total 126  CPK-MB, Serum  7.9 (Result(s) reported on 20 Mar 2012 at 03:47PM.)  Troponin I  1.90 (0.00-0.05 0.05 ng/mL or less: NEGATIVE  Repeat testing in 3-6 hrs  if clinically indicated. >0.05 ng/mL: POTENTIAL  MYOCARDIAL INJURY. Repeat  testing in 3-6 hrs if  clinically indicated. NOTE: An increase or decrease  of 30% or more on serial  testing suggests a  clinically important change)    23:12   CK, Total 129  CPK-MB, Serum  6.0 (Result(s) reported on 20 Mar 2012 at 11:53PM.)  Troponin I  2.70 (0.00-0.05 0.05 ng/mL or less: NEGATIVE  Repeat testing in 3-6 hrs  if clinically indicated. >0.05 ng/mL: POTENTIAL  MYOCARDIAL INJURY. Repeat  testing in 3-6 hrs if  clinically indicated. NOTE: An increase or decrease  of 30% or more on serial  testing suggests a  clinically important change)  24-Feb-14 06:26   CK, Total 108  CPK-MB, Serum  4.8 (Result(s) reported on 21 Mar 2012 at 07:25AM.)  Troponin I  2.00 (0.00-0.05 0.05 ng/mL or less: NEGATIVE  Repeat testing in 3-6 hrs  if clinically indicated. >0.05 ng/mL: POTENTIAL  MYOCARDIAL INJURY. Repeat  testing in 3-6 hrs if  clinically indicated. NOTE: An increase or decrease  of 30% or more on serial  testing suggests a  clinically important change)  Routine Hem:  23-Feb-14 14:52   WBC (CBC)  21.7  RBC (CBC) 5.39  Hemoglobin (CBC) 14.4  Hematocrit (CBC) 43.6  Platelet Count (CBC) 256 (Result(s) reported on 20 Mar 2012 at 03:15PM.)  MCV 81  MCH 26.8  MCHC 33.1  RDW 14.3  24-Feb-14 06:26   WBC (CBC)  12.7  RBC (CBC) 5.20  Hemoglobin (CBC) 14.2  Hematocrit (CBC) 42.6  Platelet Count (CBC) 210  MCV 82  MCH 27.3  MCHC 33.2  RDW 14.5  Neutrophil % 82.7  Lymphocyte % 7.7  Monocyte % 8.3  Eosinophil % 0.9  Basophil % 0.4  Neutrophil #   10.5  Lymphocyte # 1.0  Monocyte #  1.1  Eosinophil # 0.1  Basophil # 0.0 (Result(s) reported on 21 Mar 2012 at 07:26AM.)   EKG:  Interpretation EKG 03/20/12: NSR, 83 bpm, small Qs V1-V3, IVCD II, aVF, TWI II, aVFTW flattening V4-V6, no ST change   Radiology Results: XRay:    23-Feb-14 18:47,  Chest PA and Lateral  Chest PA and Lateral   REASON FOR EXAM:    epigastric pain; elevated Troponin I  COMMENTS:       PROCEDURE: DXR - DXR CHEST PA (OR AP) AND LATERAL  - Mar 20 2012  6:47PM     RESULT: The lungs are reasonably well inflated allowing for the sitting   position. The cardiac silhouette is enlarged. The central pulmonary   vascularity is prominent. There is no pleural effusion or alveolar   infiltrate.    IMPRESSION:  There is no definite evidence of CHF nor of pneumonia. When   the patient can tolerate the procedure, a PA and lateral chest x-ray with   deep inspiration would be of value.     Dictation Site: 5    Verified By: DAVID A. Martinique, M.D., MD  CT:    23-Feb-14 15:21, CT Head Without Contrast  CT Head Without Contrast   REASON FOR EXAM:    fall  COMMENTS:       PROCEDURE: CT  - CT HEAD WITHOUT CONTRAST  - Mar 20 2012  3:21PM     RESULT: Axial noncontrast CT scanning was performed through the brain   with reconstructions at 3 mm intervals and slice thicknesses.    There is mild diffuse cerebral and cerebellar atrophy with mild   compensatory ventriculomegaly. There is no shift of the midline. There is   no evidence of an acute intracranial hemorrhage. There is no evolving   ischemic infarction. The cerebellum andbrainstem are normal in density.   At bone window settings the observed portions of the paranasal sinuses   and mastoid air cells are clear. There is no evidence of an acute skull   fracture.  IMPRESSION:  There is no acute intracranial abnormality. There are mild   age related atrophic changes with mild ventriculomegaly.     Dictation Site:  5        Verified By: DAVID A. Martinique, M.D., MD    23-Feb-14 15:26, CT Cervical Spine Without Contrast  CT Cervical Spine Without Contrast   REASON FOR EXAM:    fall  COMMENTS:       PROCEDURE: CT  - CT CERVICAL SPINE WO  - Mar 20 2012  3:26PM     RESULT: Sagittal, axial, and coronal images through the cervical spine   are reviewed. A bone algorithm was employed.    The cervical vertebralbodies are preserved in height. There is mild disc   space narrowing at multiple levels. The prevertebral soft tissue spaces   are normal in appearance. There is no evidence of a perched facet or   spinous process fracture. The bony ring at each cervical level is intact.   The odontoid is intact and the lateral masses of C1 align normally with   those of C2.  IMPRESSION:   1. There is no evidence of an acute cervical spine fracture nor   dislocation.  2. There is mild degenerative disc change at multiple levels of the   cervical spine.     Dictation Site: 5        Verified By: DAVID A. Martinique, M.D., MD    23-Feb-14 17:35, CT Abdomen and Pelvis With Contrast  CT Abdomen and Pelvis With Contrast   REASON FOR EXAM:    (1) abdominal pain; (2) abdominal pain  COMMENTS:       PROCEDURE: CT  - CT ABDOMEN / PELVIS  W  - Mar 20 2012  5:35PM     RESULT: Axial noncontrast CT scanning was performed through the abdomen   and pelvis with reconstructions at 3 mm intervals and slice thicknesses.   The patient received 100 cc of Isovue-370. Review of multiplanar   reconstructed images was performed separately on the VIA monitor. The   study is limited due to the patient's body habitus resultant contact of   the flanks with the scanner gantry.     On the the uppermost images of the scan there are coronary artery   calcifications demonstrated. There is no pleural nor pericardial   effusion. There is minimal compressive atelectasis at the lung bases.   The liver exhibits no focal mass or ductal dilation.  The gallbladder is   adequately distended with no evidence of calcified stones. The spleen,   nondistended stomach, adrenal glands, and kidneys are normal in   appearance. There is an exophytic hypodensity from the anterior aspect of   the midpole of the left kidney. It has HU measurement of +32 and measures   3.5 cm in diameter. This is most compatible with a cyst. There is a 2.4   cm diameter accessory spleen near the splenic hilum. The caliber of the   abdominal aorta is normal. There is no periaortic or pericaval   lymphadenopathy.    The unopacified loops of small and large bowel exhibit no evidence of   ileus or obstruction. There are no findings to suggest enteritis or acute   diverticulitis.  There is no significant inguinal nor umbilical hernia.   The partially distended urinary bladder is normal in appearance. Prostate   gland and seminal vesicles are normal. There are a few borderline     enlarged inguinal lymph nodes.    On delayed images contrast within the renal collecting systems is normal.   The lumbar vertebral bodies are preserved in height.    IMPRESSION:   1. There is no evidence of acute hepatobiliary abnormality.  2. There is likely a cyst associated with the anterior aspect of the   midpole of the left kidney.  3. There is no evidence of bowel obstruction or ileus.  4. There is no evidence of ascites nor of an intra-abdominal abscess nor   lymphadenopathy.  5. There is no evidence of an abdominal aortic aneurysm.     Dictation Site: 5    Verified By: DAVID A. Martinique, M.D., MD    No Known Allergies:   Vital Signs/Nurse's Notes: **Vital Signs.:   24-Feb-14 04:08  Vital Signs Type Q 4hr  Temperature Temperature (F) 98.1  Celsius 36.7  Temperature Source oral  Pulse Pulse 75  Respirations Respirations 18  Systolic BP Systolic BP 226  Diastolic BP (mmHg) Diastolic BP (mmHg) 73  Mean BP 97  Pulse Ox % Pulse Ox % 95  Pulse Ox Activity Level  At rest   Oxygen Delivery Room Air/ 21 %    Impression [name] is a 69 yo Caucasian male with past medical history significant for morbid obesity, CAD (s/p PCI-LM, residual dz elsewhere on 33/3545 cath), diastolic dysfunction, type 2 DM, HLD and HTN who was admitted to [facility] yesterday after being found down following a fall out of bed, with epigastric discomfort/chest pain. Troponin elevated with peak >2.   Plan 1. CAD/NSTEMI -known severe three vessel disease, including left main stent.  Noncomplinat with meds for at least 6 months, including aspirin. Ideally, would perform cardiac cath given known severe disease.   - Patient  has declined cardiac catheterization, will plan on medical management   - Resume ASA,  reload Effient for now. Stop Lovenox.   - Continue ARB, BB, high-dose statin, Imdur, NTG SL PRN  2. Diastolic dysfunction-  noted on prior 2D echo in 2012. Euvolemic on exam. Denies PND, orthopnea, worsening DOE/SOB or LE edema.      - Review echo findings this admission      - Resume current Lasix dosing, supplement potassium with KCl 20 mEq daily      - Hold IV fluids  3. Type 2 DM     - Per primary team, compliance, weight loss and follow-up will all be important to achieve glycemic control  4. Hyperlipidemia     - Resume high-dose atorvastatin     - Check lipid panel and LFTs in 6 weeks  5. Hypertension     - Well-controlled, resume current antihypertensives  6. Morbid obesity     - Would benefit from PT     - Needs to lose weight for risk factor reduction  7. Medical noncompliance suspect secondary to depression. Consider outpt psych?  restart meds for depression.     - Social work consult to assist   Electronic Signatures: Meriel Pica (PA-C)  (Signed 24-Feb-14 09:34)  Authored: General Aspect/Present Illness, History and Physical Exam, Review of System, Past Medical History, Health Issues, Home Medications, Labs, EKG , Radiology, Allergies, Vital Signs/Nurse's  Notes, Impression/Plan Ida Rogue (MD)  (Signed 24-Feb-14 10:43)  Authored: General Aspect/Present Illness, Impression/Plan  Co-Signer: General Aspect/Present Illness, History and Physical Exam, Review of System, Past Medical History, Health Issues, Home Medications, Labs, EKG , Radiology, Allergies, Vital Signs/Nurse's Notes, Impression/Plan   Last Updated: 24-Feb-14 10:43 by Ida Rogue (MD)

## 2014-05-18 NOTE — H&P (Signed)
PATIENT NAME:  [name], [name] MR#:  [id number] DATE OF BIRTH:  1945-10-29  DATE OF ADMISSION:  03/20/2012  REFERRING PHYSICIAN: Glennie Isle, MD   FAMILY PHYSICIAN: Barbette Reichmann, MD   REASON FOR ADMISSION: Epigastric pain with elevated troponin, consistent with non-ST elevation MI.   HISTORY OF PRESENT ILLNESS: The patient is a 69 year old male with a history of coronary artery disease, status post MI and stent placement approximately 1 year ago. He has a history of morbid obesity, benign hypertension, hyperlipidemia, and diabetes. He presents to the Emergency Room with epigastric pain associated with generalized weakness. In the Emergency Room, the patient had an abdominal CT which was unremarkable. EKG showed no acute changes, but his troponin returned positive at 1.9. Denies chest pain or shortness of breath. He remains on beta blockers with Effient and aspirin because of the stent placement. He is followed by [facility] cardiology. He is now admitted for further evaluation.   PAST MEDICAL HISTORY: 1.  Morbid obesity. 2.  ASCVD status post MI with PTCA and stent placement.  3.  History of lower extremity cellulitis.  4.  Venostasis with peripheral edema.  5.  Benign hypertension.  6.  Hyperlipidemia.  7.  Type 2 diabetes.  8.  History of skin grafting to the right foot.  9.  Status post left knee surgery.  10.  Chronic knee pain.  11.  Status post cataract surgery.   MEDICATIONS: 1.  Nystatin powder applied to rash t.i.d. 2.  Nitrostat 0.4 mg sublingually p.r.n. chest pain.  3.  Lopressor 12.5 mg p.o. b.i.d.  4.  Lasix 40 mg p.o. daily.  5.  Humulin-N 40 units subcutaneous b.i.d.  6.  Glyburide 5 mg p.o. b.i.d.  7.  Metformin 1000 mg p.o. b.i.d.  8.  Effient 10 mg p.o. daily.  9.  Lopid 600 mg p.o. b.i.d.  10. Enteric-coated aspirin 325 mg p.o. daily.   ALLERGIES: No known drug allergies.   SOCIAL HISTORY: The patient denies alcohol or tobacco abuse. Lives alone.   FAMILY  HISTORY: Positive for coronary artery disease, diabetes, hypertension and stroke. Negative for prostate or colon cancer.   REVIEW OF SYSTEMS: CONSTITUTIONAL: No fever or change in weight.  EYES: No blurred or double vision. No glaucoma.  ENT: No tinnitus or hearing loss. No nasal discharge or bleeding. No difficulty swallowing.  RESPIRATORY: No cough or wheezing. Denies hemoptysis or painful respirations.  CARDIOVASCULAR: No chest pain or orthopnea. No palpitations. No syncope.  GASTROINTESTINAL: No nausea, vomiting or diarrhea. Does have abdominal pain which is epigastric in nature. No change in bowel habits.  GENITOURINARY: No dysuria or hematuria. No incontinence.  ENDOCRINE: No polyuria or polydipsia. No heat or cold intolerance.  HEMATOLOGIC: The patient denies anemia, easy bruising or bleeding.  LYMPHATIC: No swollen glands.  MUSCULOSKELETAL: The patient has pain in his knees, but denies pain in his neck, back, shoulders or hips. No gout.  NEUROLOGIC: No numbness, although he does have generalized weakness. Denies migraines, stroke or seizures.  PSYCHIATRIC: The patient denies anxiety, insomnia or depression.   PHYSICAL EXAMINATION: GENERAL: The patient is obese, in no acute distress.  VITAL SIGNS: Currently remarkable for a blood pressure of 141/60 with a heart rate of 87, respiratory rate of 22. He is afebrile.  HEENT: Normocephalic, atraumatic. Pupils equally round and reactive to light and accommodation. Extraocular movements are intact. Sclerae anicteric. Conjunctivae are clear.   OROPHARYNX: Clear.  NECK: Supple without JVD. No adenopathy or thyromegaly is noted.  LUNGS: Basilar crackles without wheezes or rhonchi. No dullness. Respiratory effort is normal.  CARDIAC: Regular rate and rhythm with normal S1, S2. No significant rubs, murmurs or gallops. PMI is nondisplaced. Chest wall is nontender.  ABDOMEN: Soft but tender in the epigastrium. No rebound or guarding. Normoactive  bowel sounds. No organomegaly or masses were appreciated. No hernias or bruits were noted.  EXTREMITIES: Revealed 3+ edema with stasis changes. Pulses were 1+ bilaterally.  SKIN: Warm and dry without lesions, although tinea corporis was noted diffusely.  NEUROLOGIC: Cranial nerves II through XII grossly intact. Deep tendon reflexes were symmetric. Motor exam was nonfocal. Sensory exam reveals stocking glove neuropathy.  PSYCHIATRIC: Revealed a patient who was alert and oriented to person, place and time. He was cooperative and used good judgment.   LABORATORY, DIAGNOSTIC AND RADIOLOGIC DATA: EKG revealed sinus rhythm with no acute ischemic changes.  CT of the abdomen was nondiagnostic except for some possible renal cysts. His lipase was 73. Total CK was 126 with an MB of 7.9 and a troponin of 1.9. Glucose 254 with a BUN of 10, creatinine of 0.86 and a GFR of greater than 60. Sodium was 136 and potassium of 3.6. His white count was 21.7, with a hemoglobin of 14.4.   ASSESSMENT: 1.  Acute non-ST elevation myocardial infarction.  2.  Atherosclerotic cardiovascular disease status post previous myocardial infarction with stent placement.  3.  Morbid obesity.  4.  Type 2 diabetes.  5.  Diabetic neuropathy.  6.  Chronic lower extremity edema with venous insufficiency.  7.  History of lower extremity cellulitis.  8.  Hyperlipidemia.  9.  Benign hypertension.  10.  Abdominal pain questionably related to his cardiac status.   PLAN: The patient will be admitted to telemetry with aspirin, Effient and Lovenox. We will continue his beta blocker and add oral nitrates. We will also add an angiotensin receptor blocker at this time. We will follow serial cardiac enzymes and obtain an echocardiogram. We will consult cardiology in the morning for further evaluation and possible cardiac catheterization. We will supplement oxygen at this time and wean as tolerated. We will follow sugars with Accu-Cheks before meals  and at bedtime and add sliding scale insulin as needed. We will hold his metformin for now, as the patient may require cardiac catheterization. Follow-up chest x-ray is pending from tonight. We will consult physical therapy. There is some history of poor living conditions at home. We will consult the care manager for possible placement. Further treatment and evaluation will depend upon the patient's progress.   TOTAL TIME SPENT ON THIS PATIENT: 50 minutes.    ____________________________ Duane LopeJeffrey D. Judithann SheenSparks, MD jds:cc D: 03/20/2012 18:41:54 ET T: 03/20/2012 19:37:46 ET JOB#: [id number]  cc: Duane LopeJeffrey D. Judithann SheenSparks, MD, <Dictator> Barbette ReichmannVishwanath Hande, MD Hodari Chuba Rodena Medin Rylin Saez MD ELECTRONICALLY SIGNED 03/21/2012 8:01

## 2014-05-18 NOTE — Discharge Summary (Signed)
PATIENT NAME:  [name], [name] MR#:  [id number] DATE OF BIRTH:  September 14, 1945  DATE OF ADMISSION:  05/07/2012 DATE OF DISCHARGE:  05/09/2012  DISCHARGE DIAGNOSES:  1.  Non-ST-elevation myocardial infarction.  2.  Coronary artery disease with history of stent to the left main.  3.  Morbid obesity.  4.  Type 2 diabetes.  5.  Hyperlipidemia.  6.  Depression.  7.  Noncompliance with medications.   CHIEF COMPLAINT: Chest pain.   HISTORY OF PRESENT ILLNESS: [name] is a 69 year old male with a history of type 2 diabetes, hypertension, obesity and hyperlipidemia who presented to the ED with chest pain. The patient described the pain as dull, in a substernal location, without any radiation. No orthopnea or nocturnal dyspnea. Denies any cough, fevers, chills or shortness of breath. The patient had a previous MI approximately 2 months ago and had declined cardiac catheterization at that time. The patient, however, has been noncompliant with medication and although he was advised to take aspirin, Plavix, statin, insulin and his blood pressure medications, he has not been on any of those because of her financial issues.   PAST SURGICAL HISTORY: Significant for PTCA stent placement of the left main, done by Dr. Excell Seltzerooper in 2012.   SOCIAL HISTORY: The patient denies any use of tobacco or alcohol.   PHYSICAL EXAMINATION: VITAL SIGNS: Blood pressure was 160/77, pulse 84, respirations 22, O2 sat 97% on room air.  GENERAL:  He was in significant pain on admission.  He was alert and awake.  HEENT: Normocephalic, atraumatic.  NECK: Supple.  HEART: S1 and S2.  LUNGS: Clear to auscultation.  ABDOMEN: Obese. No distention. No tenderness.  EXTREMITIES: Trace edema.  NEUROLOGIC: Nonfocal. SKIN:  Evidence of venostasis and dermatitis noted.   LABORATORY AND DIAGNOSTICS: Glucose 342, BUN 12, creatinine 0.79. First troponin was 0.04. CK initially was 57. MB was 1.3.   EKG did not show any evidence of ST-T changes,  however, the patient's subsequent cardiac enzymes showed elevation of CK to 257 and 340, respectively. MB fraction also went up to 35.3 and 53.9, respectively. Troponin went up as high as 13.    HOSPITAL COURSE:  During his stay in the hospital, he was also seen by  cardiologist, Dr. Charlton HawsPeter Nishan, and also by Dr. Mariah MillingGollan.  Dr. Eden EmmsNishan felt that some of his telemetry strip showed evidence for symptomatic idioventricular rhythm and 12 beats of non-SVT.  He was therefore started on IV amiodarone. He was also started on Effient.  In addition, he was placed on IV heparin. During his stay in the hospital, he was chest pain free. The patient's morbid obesity made it difficult for him to be cat[facility]. After discussions with Dr. Mariah MillingGollan it was felt that he could undergo cardiac [facility] and he was therefore [facility].  DISCHARGE MEDICATIONS: 1.  Heparin drip. 2.  Amiodarone drip, current rate of 16.6 mL/hour. 3.  Aspirin 325 mg a day. 4.  Furosemide 40 mg a day. 5.  Gemfibrozil 600 mg p.o. b.i.d.  6.  Lantus insulin 30 units sub-Q at bedtime and also sliding scale insulin. 7.  Isosorbide mononitrate 30 mg p.o. daily. 8.  Losartan 25 mg a day. 9.  Metoprolol 25 mg p.o. b.i.d.  10.  Morphine injection 2 to 4 mg IV push p.r.n. every 4 to 6 hours. 11.  Nitroglycerin sublingual p.r.n.  12.  Pantoprazole 40 mg p.o. q. a.m.  13.  Sertraline 50 mg a day. 14.  Effient 10 mg p.o. daily.   CONDITION AT TIME OF DISCHARGE:  The patient was stable. ____________________________ Barbette Reichmann, MD vh:sb D: 05/09/2012 08:30:10 ET T: 05/09/2012 08:45:12 ET JOB#: [id number]  cc: Barbette Reichmann, MD, <Dictator> Barbette Reichmann MD ELECTRONICALLY SIGNED 05/12/2012 13:14

## 2014-05-18 NOTE — Consult Note (Signed)
General Aspect 69 yo morbidly obese make admitted with SSCP and enzymes positive this am   Present Illness 69 yo patient of Dr N[facility]ser[facility].  Last seen in the [place] office in March.  Having SSCP.  Advised to have cath but patient declined.  History of LM stent in 2012 with POB of distal LAD.  Dr Randolm Idol note indicated that patient was not a candidate for CABG due to weight and comorbidities.  Since then patients weigth has decreased from 475 to about 360.  Admitted last night with SSCP.  Currently pain free.  ECG with no ST elevation or depression.  Discussed with patient at length He is willing to have heart cath at this time.  Telemetry review shows 2 episodes of asymptomatic idioventricular rhythm and 12 beats of NSVT   Physical Exam:  GEN Morbidly obese white male   HEENT Normal   NECK supple  No masses   RESP normal resp effort  clear BS   CARD Regular rate and rhythm  Normal, S1, S2  No murmur   ABD soft  normal BS   EXTR positive edema   SKIN Venous stasis in LE;s bilateral   PSYCH Alert and upbeat   Review of Systems:  Subjective/Chief Complaint All systems reviewed and negative except as indicated in HPI  Currently no lightheadedness, chest pain or dyspnea   Medications/Allergies Reviewed Medications/Allergies reviewed   Lab Results: Routine Chem:  12-Apr-14 12:55   Glucose, Serum  342  BUN 12  Creatinine (comp) 0.79  Sodium, Serum 138  Potassium, Serum 4.0  Chloride, Serum 107  CO2, Serum 25  Calcium (Total), Serum  8.4  Anion Gap  6  Osmolality (calc) 289  eGFR (African American) >60  eGFR (Non-African American) >60 (eGFR values <89mL/min/1.73 m2 may be an indication of chronic kidney disease (CKD). Calculated eGFR is useful in patients with stable renal function. The eGFR calculation will not be reliable in acutely ill patients when serum creatinine is changing rapidly. It is not useful in  patients on dialysis. The eGFR calculation may not be  applicable to patients at the low and high extremes of body sizes, pregnant women, and vegetarians.)    15:53   Result Comment troponin - RESULTS VERIFIED BY REPEAT TESTING.  - READ-BACK PROCESS PERFORMED.  - c/chris vodioa 4/12/14at 1640.nbb  Result(s) reported on 07 May 2012 at 04:43PM.    21:18   Result Comment TROPONIN - RESULTS VERIFIED BY REPEAT TESTING.  - ELEVATED TROPONIN PREVIOUSLY CALLED AT  - 1640 05/07/12.PMH  Result(s) reported on 07 May 2012 at 10:02PM.  13-Apr-14 04:23   Result Comment TROPONIN - RESULTS VERIFIED BY REPEAT TESTING.  - ELEVATED TROPONIN PREVIOUSLY CALLED AT  - 1640 05/07/12.PMH  Result(s) reported on 08 May 2012 at 05:16AM.  Hemoglobin A1c [facility])  9.7 (The American Diabetes Association recommends that a primary goal of therapy should be <7% and that physicians should reevaluate the treatment regimen in patients with HbA1c values consistently >8%.)  Cholesterol, Serum 164  Triglycerides, Serum 141  HDL (INHOUSE)  29  VLDL Cholesterol Calculated 28  LDL Cholesterol Calculated  107 (Result(s) reported on 08 May 2012 at 04:52AM.)  Cardiac:  12-Apr-14 12:55   CK, Total 57  CPK-MB, Serum 1.3 (Result(s) reported on 07 May 2012 at 02:35PM.)  Troponin I 0.04 (0.00-0.05 0.05 ng/mL or less: NEGATIVE  Repeat testing in 3-6 hrs  if clinically indicated. >0.05 ng/mL: POTENTIAL  MYOCARDIAL INJURY. Repeat  testing in 3-6 hrs if  clinically indicated. NOTE: An increase or decrease  of 30% or more on serial  testing suggests a  clinically important change)    15:53   Troponin I  0.14 (0.00-0.05 0.05 ng/mL or less: NEGATIVE  Repeat testing in 3-6 hrs  if clinically indicated. >0.05 ng/mL: POTENTIAL  MYOCARDIAL INJURY. Repeat  testing in 3-6 hrs if  clinically indicated. NOTE: An increase or decrease  of 30% or more on serial  testing suggests a  clinically important change)    21:18   CK, Total  257  CPK-MB, Serum  35.3 (Result(s) reported on 07 May 2012 at 09:59PM.)  Troponin I  4.90 (0.00-0.05 0.05 ng/mL or less: NEGATIVE  Repeat testing in 3-6 hrs  if clinically indicated. >0.05 ng/mL: POTENTIAL  MYOCARDIAL INJURY. Repeat  testing in 3-6 hrs if  clinically indicated. NOTE: An increase or decrease  of 30% or more on serial  testing suggests a  clinically important change)  13-Apr-14 04:23   CK, Total  348  CPK-MB, Serum  53.9 (Result(s) reported on 08 May 2012 at 05:11AM.)  Troponin I  13.00 (0.00-0.05 0.05 ng/mL or less: NEGATIVE  Repeat testing in 3-6 hrs  if clinically indicated. >0.05 ng/mL: POTENTIAL  MYOCARDIAL INJURY. Repeat  testing in 3-6 hrs if  clinically indicated. NOTE: An increase or decrease  of 30% or more on serial  testing suggests a  clinically important change)  Routine Coag:  13-Apr-14 04:23   Activated PTT (APTT)  38.2 (A HCT value >55% may artifactually increase the APTT. In one study, the increase was an average of 19%. Reference: "Effect on Routine and Special Coagulation Testing Values of Citrate Anticoagulant Adjustment in Patients with High HCT Values." American Journal of Clinical Pathology 2006;126:400-405.)  Routine Hem:  12-Apr-14 12:55   WBC (CBC) 10.0  RBC (CBC) 5.79  Hemoglobin (CBC) 15.4  Hematocrit (CBC) 46.8  Platelet Count (CBC) 270 (Result(s) reported on 07 May 2012 at 01:31PM.)  MCV 81  MCH 26.5  MCHC 32.8  RDW 13.8  13-Apr-14 04:23   WBC (CBC) 9.4  RBC (CBC) 5.51  Hemoglobin (CBC) 14.6  Hematocrit (CBC) 44.8  Platelet Count (CBC) 252  MCV 81  MCH 26.4  MCHC 32.5  RDW 14.4  Neutrophil % 70.4  Lymphocyte % 14.2  Monocyte % 8.9  Eosinophil % 5.8  Basophil % 0.7  Neutrophil #  6.6  Lymphocyte # 1.3  Monocyte # 0.8  Eosinophil # 0.5  Basophil # 0.1 (Result(s) reported on 08 May 2012 at 04:42AM.)   EKG:  EKG NSR low voltage no acute changes   Radiology Results: XRay:    12-Apr-14 15:35, Chest Portable Single View  Chest Portable Single View   REASON  FOR EXAM:    chest pain  COMMENTS:       PROCEDURE: DXR - DXR PORTABLE CHEST SINGLE VIEW  - May 07 2012  3:35PM     RESULT: Comparison: 03/20/2012    Findings:  The heart and mediastinum are stable. Prominence of the bilateral hila is   likely secondary to the low lung volumes and prominent central pulmonary   vasculature. There mild interstitial opacities which are likely secondary   to the low lung volumes.    IMPRESSION:   Mild interstitial opacities are likely secondary to the low lung volumes.     Interstitial edema would be of differential consideration.      Dictation Site: 8        Verified By: Herbie Baltimore  L. Orson Ape, M.D., MD    No Known Allergies:   Vital Signs/Nurse's Notes: **Vital Signs.:   13-Apr-14 07:54  Vital Signs Type Routine  Temperature Temperature (F) 98.7  Celsius 37  Temperature Source oral  Pulse Pulse 59  Respirations Respirations 18  Systolic BP Systolic BP 968  Diastolic BP (mmHg) Diastolic BP (mmHg) 75  Mean BP 89  Pulse Ox % Pulse Ox % 95  Pulse Ox Activity Level  At rest  Oxygen Delivery 2L; Nasal Cannula  *Intake and Output.:   13-Apr-14 04:56  Grand Totals Intake:   Output:      Net:   106 Hr.:  -800  Weight Type daily  Weight Method Bed  Current Weight (lbs) (lbs) 395.8  Current Weight (kg) (kg) 179.5  Height (ft) (feet) 6  Height (in) (in) 0  Height (cm) centimeters 182.8  BSA (m2) 2.8  BMI (kg/m2) 53.7    Impression SEMI:  Morbidly obese male with SEMI  No acute ECG changes but troponins have become quite elevated this am.  He is currently pain free.  Refused cath in March with Dr Acie Fredrickson.  Willing to have now.  LM stent in  2012 when 475 lbs and not thought to be CABG candidate.  Will make NPO.  Add and load with Effient.  On heparin nitrates and beta blocker NSVT:  Add amiodarone.  Some of his strips look like idioventricular reperfusion arrhythmia.   DM:  Poor control  Insulin per primary service  Discussed transferring  patient to unit with nurse   Plan Effient Amiodarone NPO possible cath in a.m.   Electronic Signatures: Josue Hector (MD)  (Signed 13-Apr-14 10:13)  Authored: General Aspect/Present Illness, History and Physical Exam, Review of System, Labs, EKG , Radiology, Allergies, Vital Signs/Nurse's Notes, Impression/Plan   Last Updated: 13-Apr-14 10:13 by Josue Hector (MD)

## 2014-08-06 IMAGING — CR DG CHEST 1V PORT
1 series · 1 of 1 positions shown · non-contrast
Comparison: Chest x-ray 03/20/2012.

CLINICAL DATA: Nausea, vomiting and weakness.  History of blood
clots.

PORTABLE CHEST - 1 VIEW

[AP]
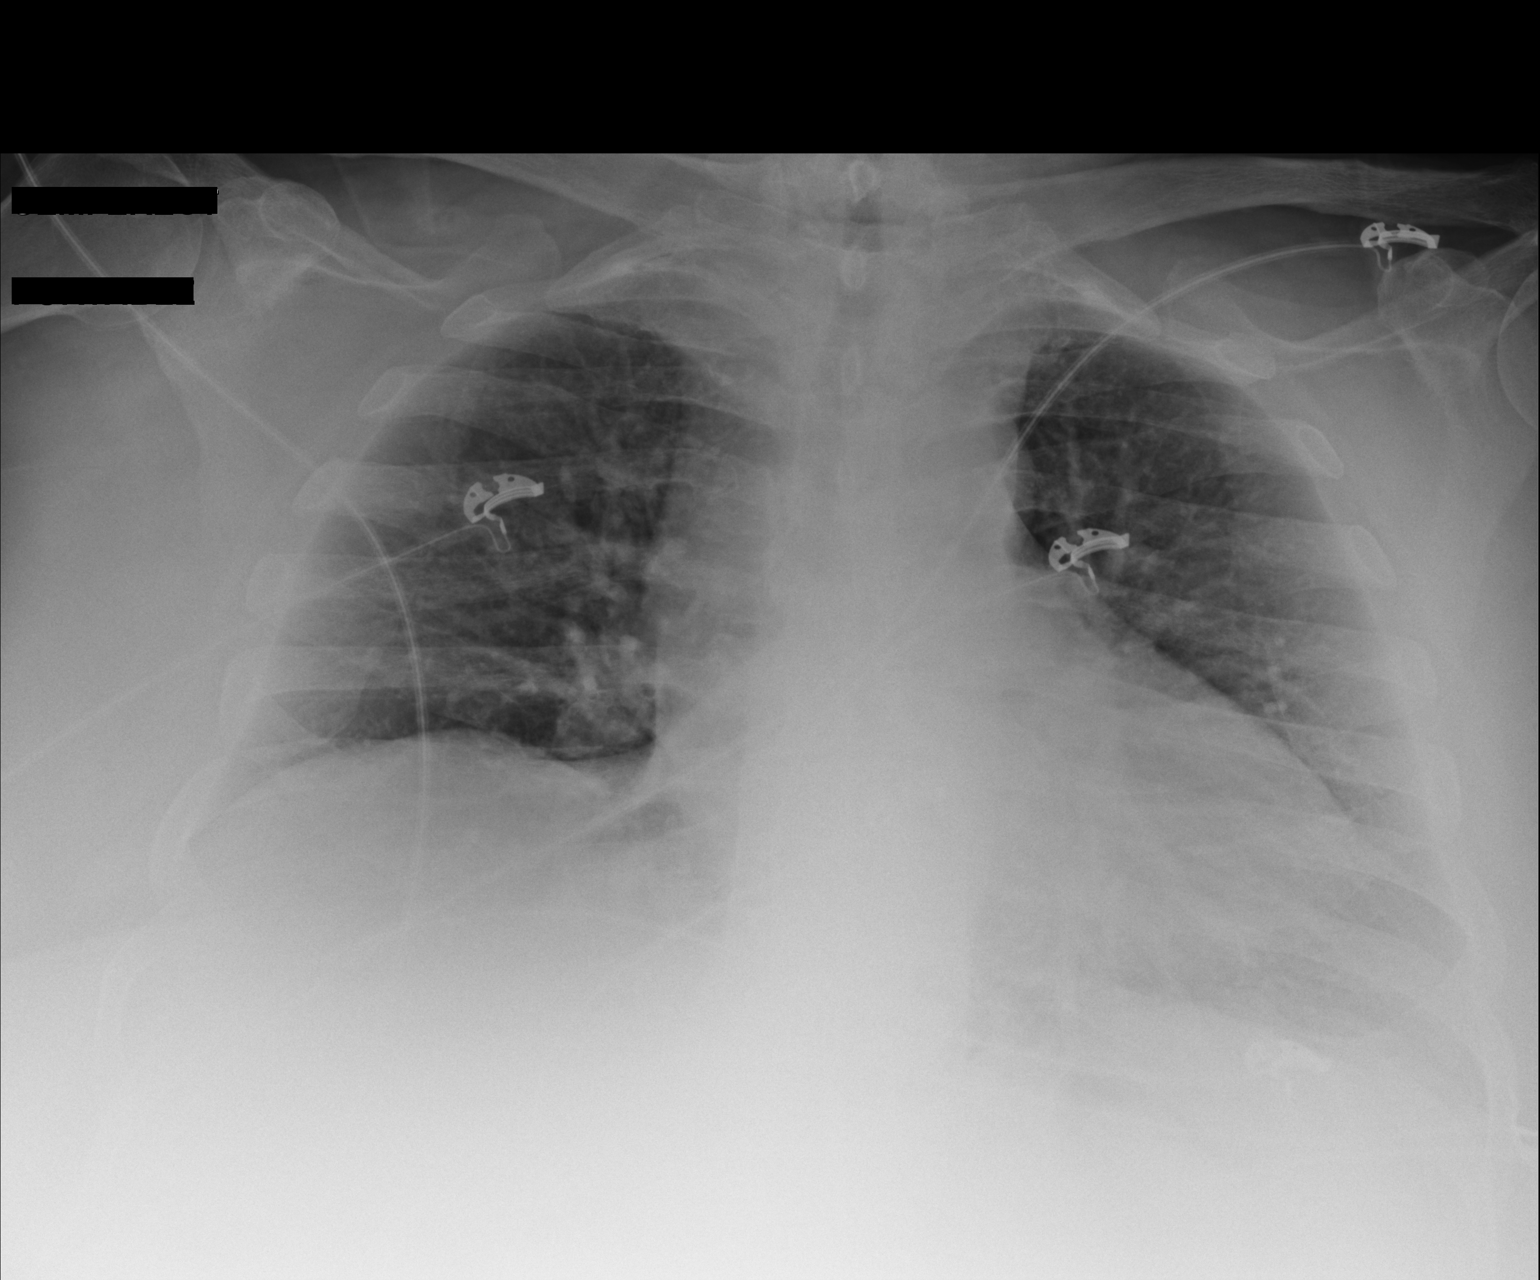

[1 of 1 positions shown; findings below may reference images not displayed]

FINDINGS: Lung volumes are low.  No definite acute consolidative
airspace disease or pleural effusions.  Pulmonary venous
congestion, likely accentuated by low lung volumes, without frank
pulmonary edema.  Heart size appears borderline enlarged.  Upper
mediastinal contours are within normal limits given the patient's
low lung volumes and lordotic positioning.
IMPRESSION: 1.  Low lung volumes without radiographic evidence of acute
cardiopulmonary disease.

## 2014-08-07 IMAGING — CR DG CHEST 2V
2 series · 2 of 2 positions shown · non-contrast
Comparison: 06/27/2012

CLINICAL DATA: Dyspnea.  Chest pain.

CHEST - 2 VIEW

[w chest lat]
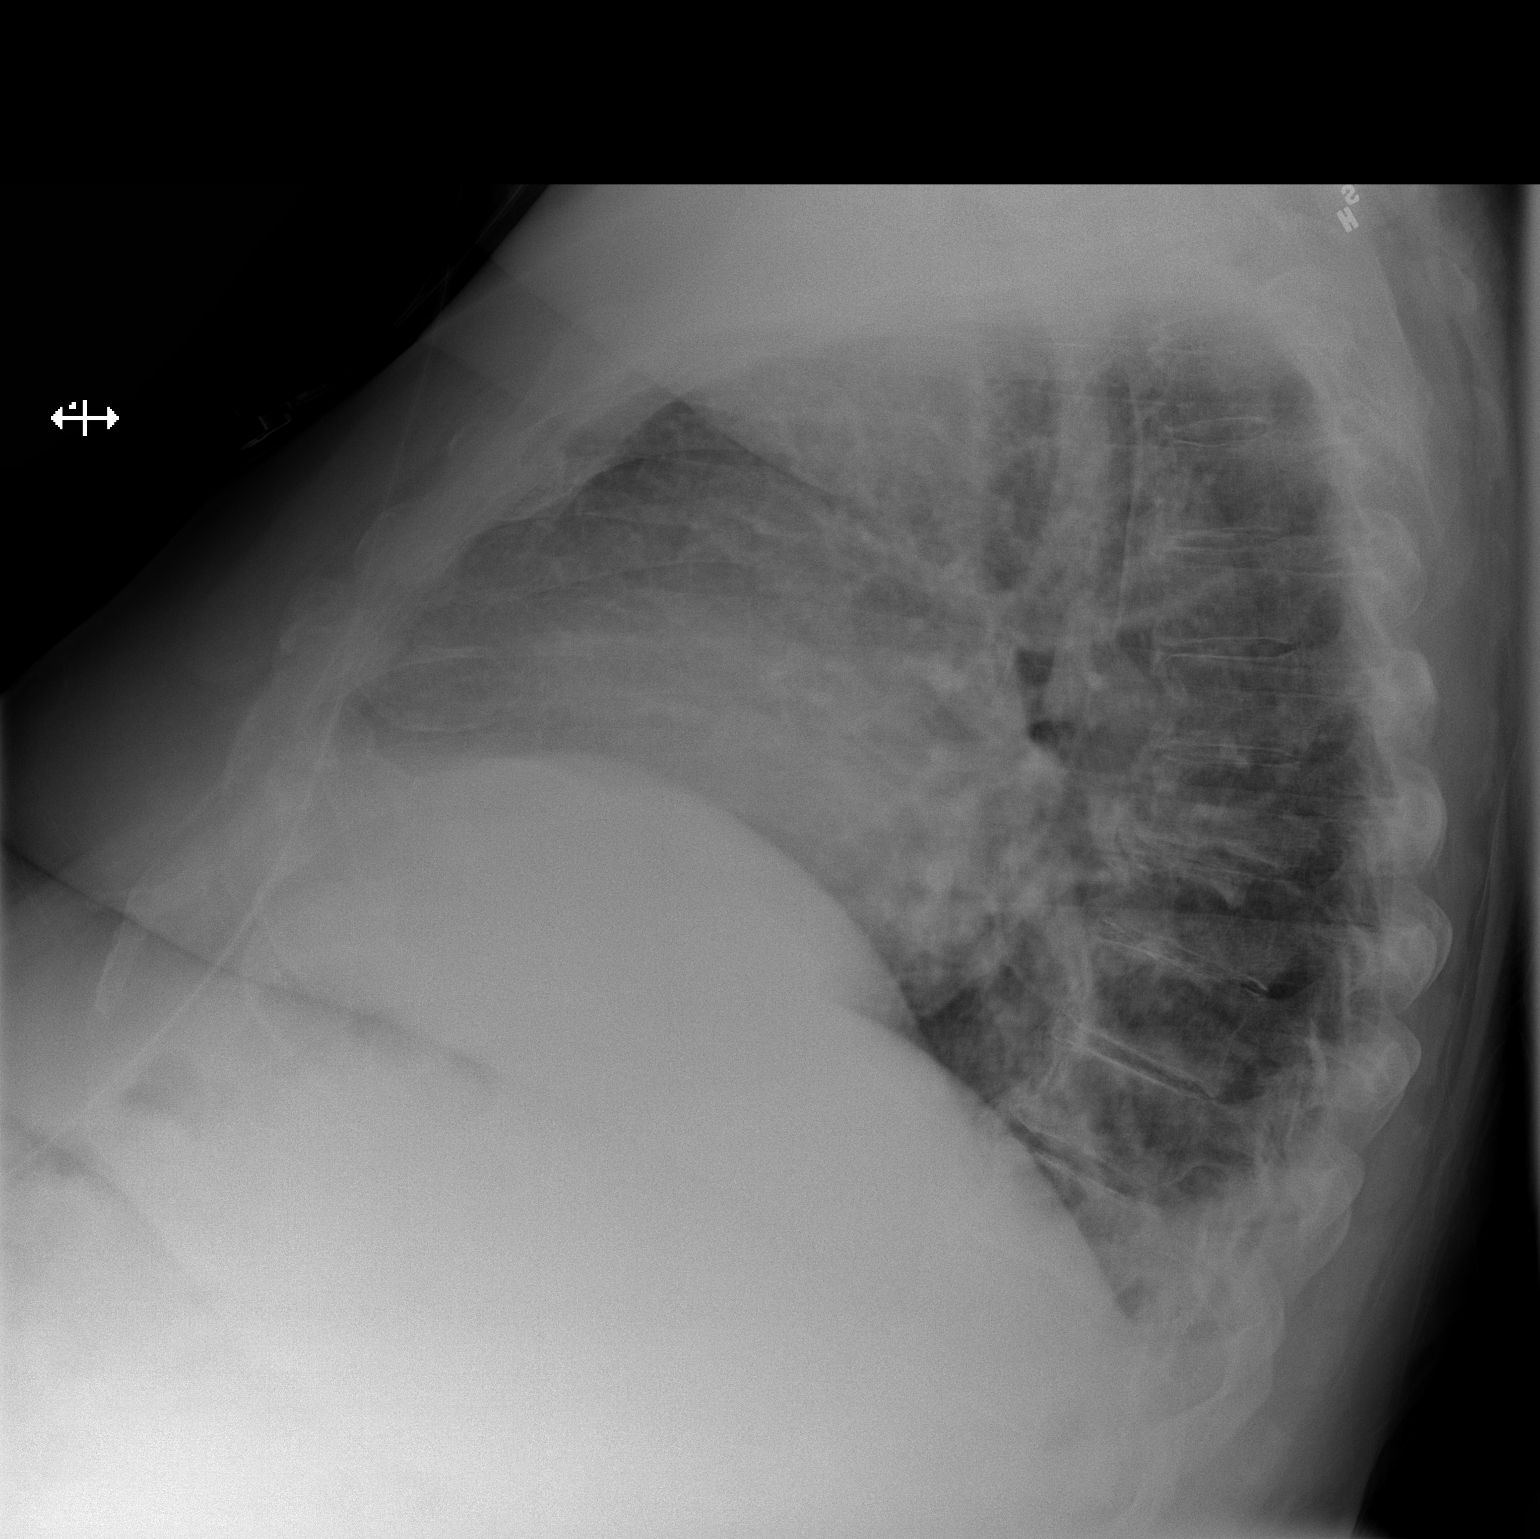

[x chest ap]
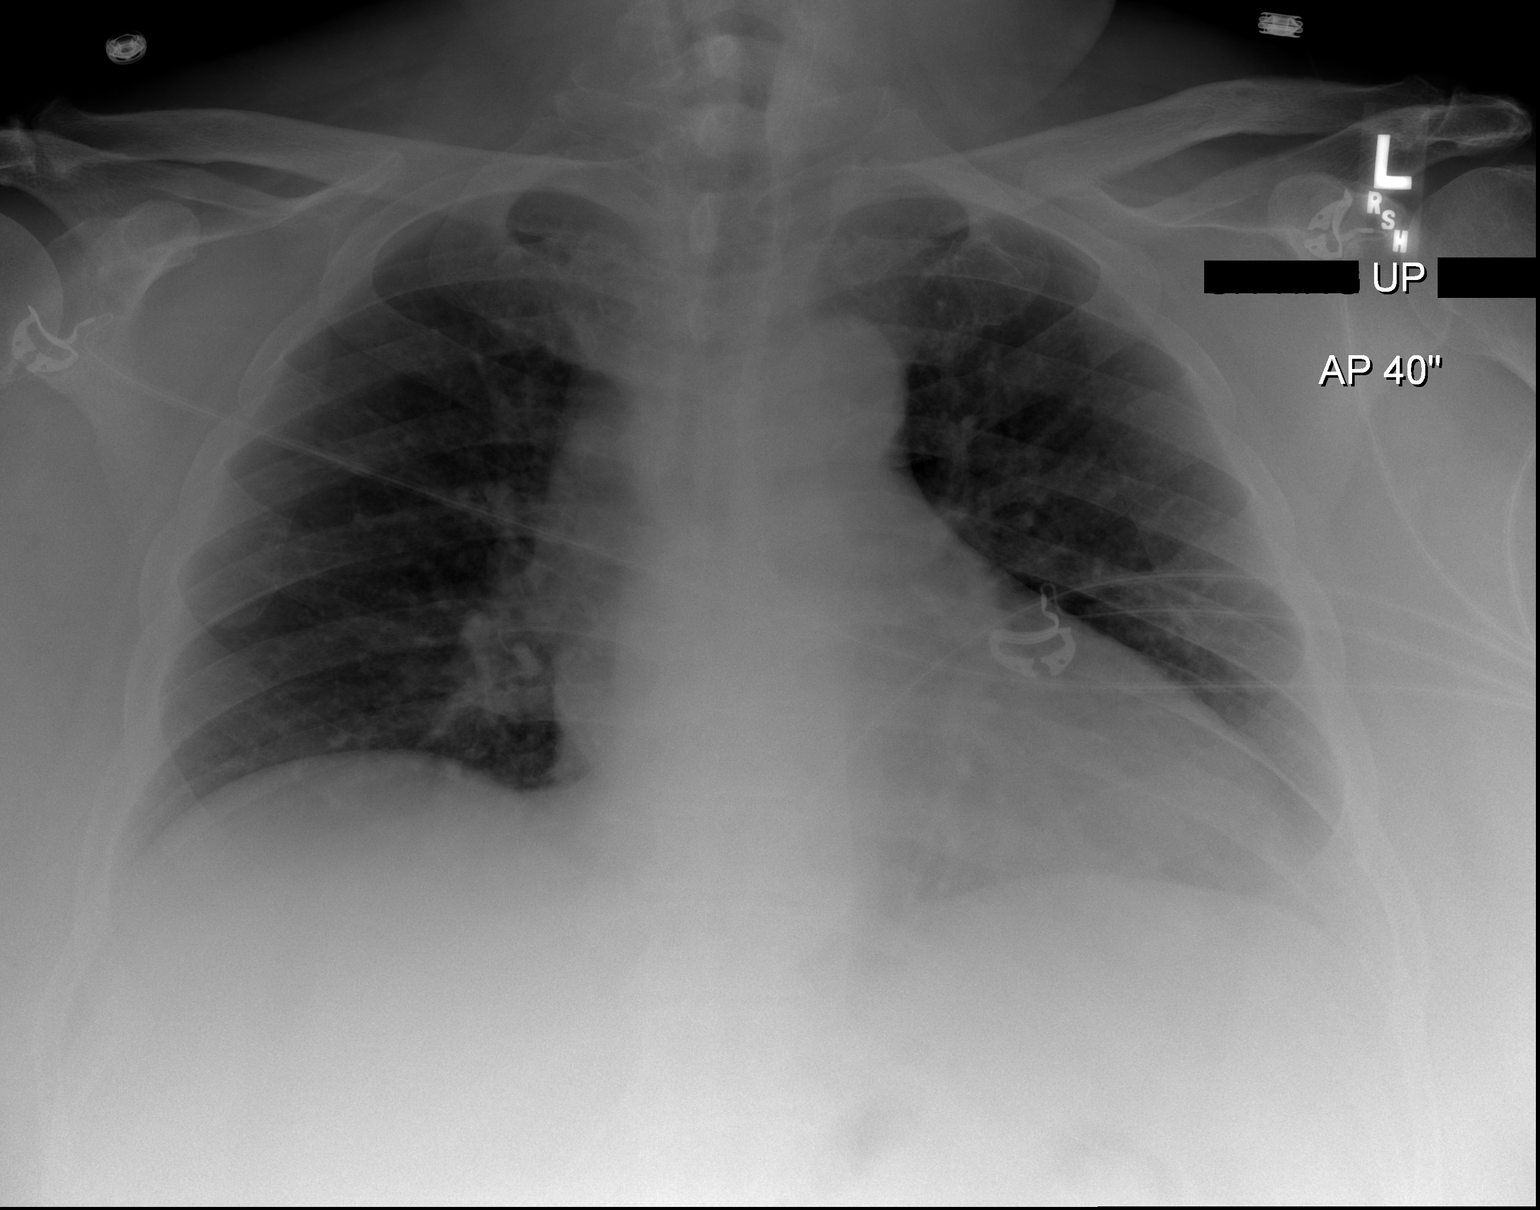

[2 of 2 positions shown; findings below may reference images not displayed]

FINDINGS: Heart size is normal.  Low lung volumes are again seen
however both lungs are clear.
IMPRESSION: Low lung volumes.  No acute findings.

## 2015-08-29 IMAGING — CR DG CHEST 1V PORT
1 series · 1 of 1 positions shown · non-contrast
Comparison: 01/30/2013

CLINICAL DATA: Mid chest pain

EXAM:
PORTABLE CHEST - 1 VIEW

[AP]
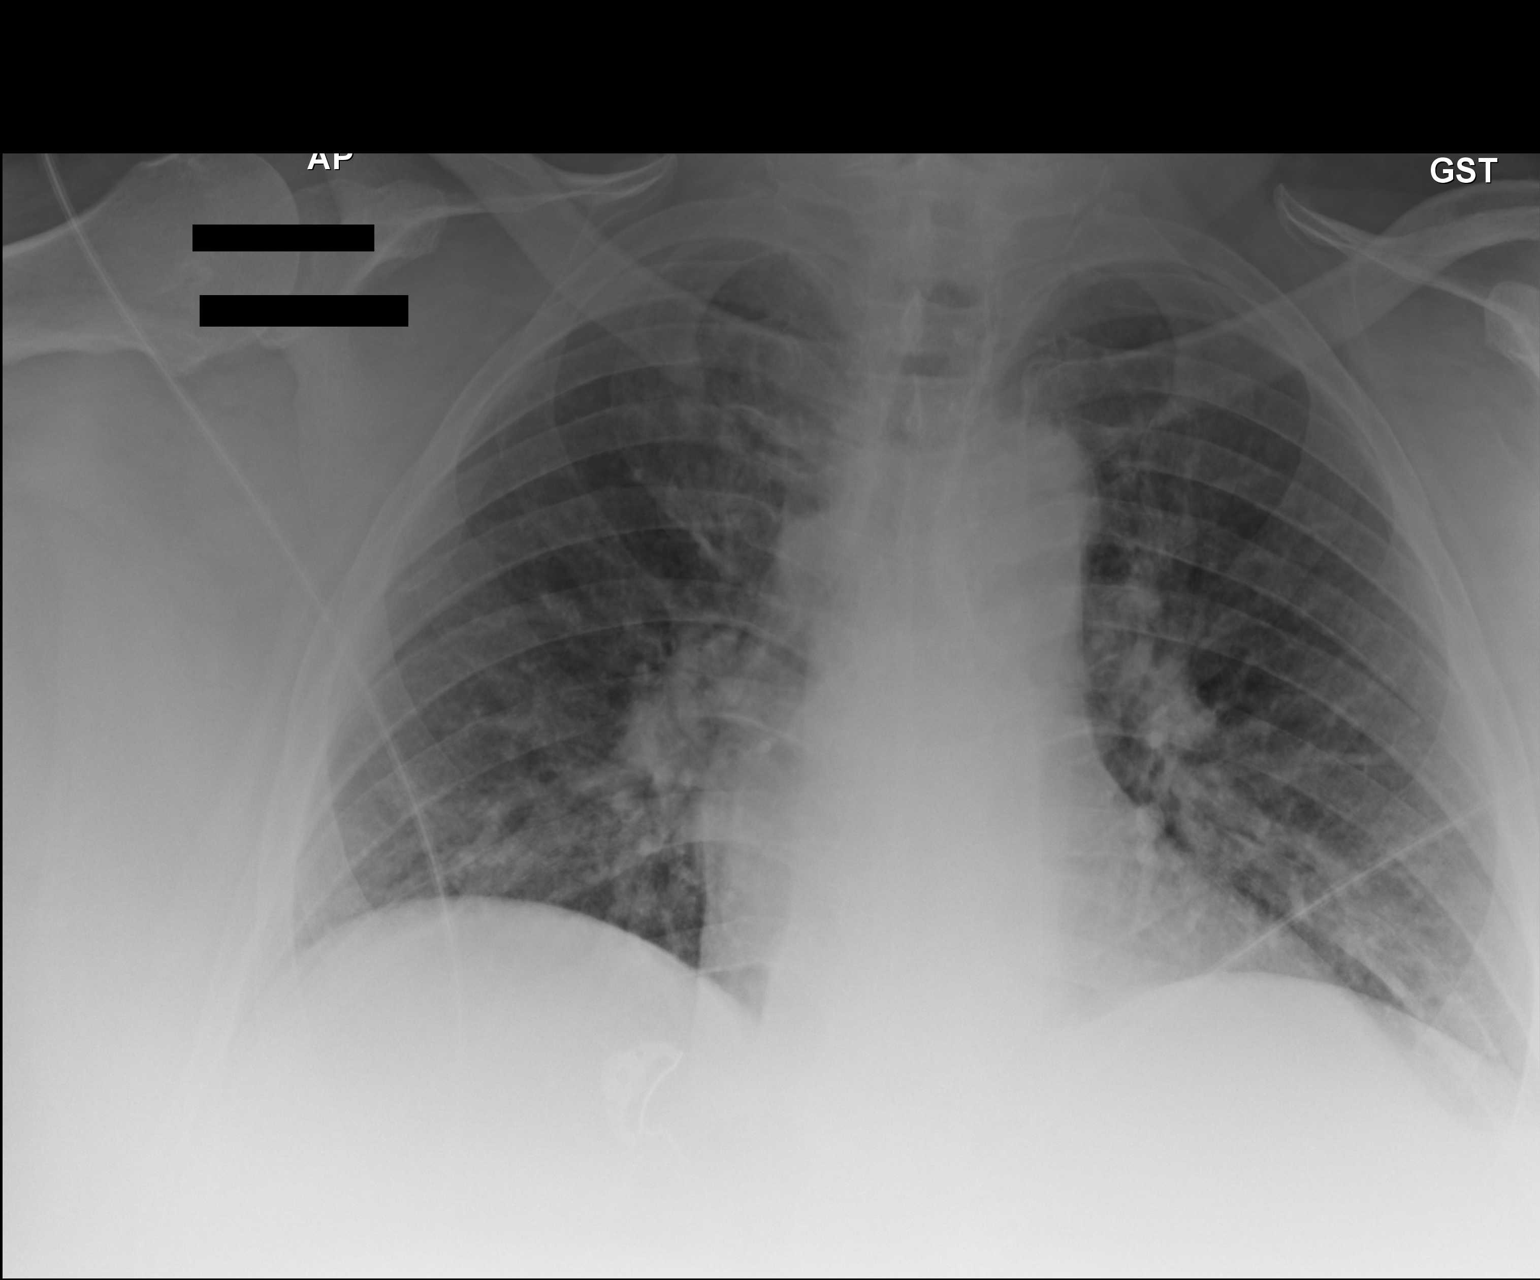

[1 of 1 positions shown; findings below may reference images not displayed]

FINDINGS: Shallow inspiration. The heart size and mediastinal contours are
within normal limits. Both lungs are clear. The visualized skeletal
structures are unremarkable.
IMPRESSION: No active disease.
